# Patient Record
Sex: Female | Born: 1975 | Race: White | Hispanic: No | Marital: Single | Smoking: Never smoker
Health system: Southern US, Community
[De-identification: ages and names within clinical notes are randomized; demographics above are authoritative.]

## PROBLEM LIST (undated history)

## (undated) DIAGNOSIS — G932 Benign intracranial hypertension: Secondary | ICD-10-CM

## (undated) DIAGNOSIS — G35D Multiple sclerosis, unspecified: Secondary | ICD-10-CM

## (undated) DIAGNOSIS — R519 Headache, unspecified: Secondary | ICD-10-CM

## (undated) DIAGNOSIS — R51 Headache: Secondary | ICD-10-CM

## (undated) DIAGNOSIS — Q059 Spina bifida, unspecified: Secondary | ICD-10-CM

## (undated) DIAGNOSIS — G35 Multiple sclerosis: Secondary | ICD-10-CM

## (undated) HISTORY — DX: Headache, unspecified: R51.9

## (undated) HISTORY — DX: Headache: R51

---

## 1997-09-22 ENCOUNTER — Ambulatory Visit (HOSPITAL_COMMUNITY): Admission: EM | Admit: 1997-09-22 | Discharge: 1997-09-23 | Payer: Self-pay | Admitting: Pediatrics

## 1998-05-06 ENCOUNTER — Encounter: Admission: RE | Admit: 1998-05-06 | Discharge: 1998-08-04 | Payer: Self-pay | Admitting: *Deleted

## 2002-09-19 ENCOUNTER — Emergency Department (HOSPITAL_COMMUNITY): Admission: EM | Admit: 2002-09-19 | Discharge: 2002-09-19 | Payer: Self-pay | Admitting: Emergency Medicine

## 2004-03-24 ENCOUNTER — Ambulatory Visit (HOSPITAL_COMMUNITY): Admission: RE | Admit: 2004-03-24 | Discharge: 2004-03-24 | Payer: Self-pay | Admitting: Neurology

## 2008-05-28 ENCOUNTER — Emergency Department (HOSPITAL_BASED_OUTPATIENT_CLINIC_OR_DEPARTMENT_OTHER): Admission: EM | Admit: 2008-05-28 | Discharge: 2008-05-28 | Payer: Self-pay | Admitting: Emergency Medicine

## 2010-07-11 ENCOUNTER — Encounter: Payer: Self-pay | Admitting: Neurology

## 2011-09-12 ENCOUNTER — Encounter (HOSPITAL_BASED_OUTPATIENT_CLINIC_OR_DEPARTMENT_OTHER): Payer: Self-pay | Admitting: *Deleted

## 2011-09-12 ENCOUNTER — Emergency Department (HOSPITAL_BASED_OUTPATIENT_CLINIC_OR_DEPARTMENT_OTHER)
Admission: EM | Admit: 2011-09-12 | Discharge: 2011-09-13 | Disposition: A | Discharge status: Home / Self Care | Payer: PRIVATE HEALTH INSURANCE | Attending: Emergency Medicine | Admitting: Emergency Medicine

## 2011-09-12 DIAGNOSIS — M62830 Muscle spasm of back: Secondary | ICD-10-CM

## 2011-09-12 DIAGNOSIS — M549 Dorsalgia, unspecified: Secondary | ICD-10-CM | POA: Insufficient documentation

## 2011-09-12 DIAGNOSIS — Z79899 Other long term (current) drug therapy: Secondary | ICD-10-CM | POA: Insufficient documentation

## 2011-09-12 DIAGNOSIS — R0602 Shortness of breath: Secondary | ICD-10-CM | POA: Insufficient documentation

## 2011-09-12 DIAGNOSIS — R42 Dizziness and giddiness: Secondary | ICD-10-CM | POA: Insufficient documentation

## 2011-09-12 DIAGNOSIS — G35 Multiple sclerosis: Secondary | ICD-10-CM | POA: Insufficient documentation

## 2011-09-12 DIAGNOSIS — M5380 Other specified dorsopathies, site unspecified: Secondary | ICD-10-CM | POA: Insufficient documentation

## 2011-09-12 HISTORY — DX: Multiple sclerosis, unspecified: G35.D

## 2011-09-12 HISTORY — DX: Multiple sclerosis: G35

## 2011-09-12 HISTORY — DX: Benign intracranial hypertension: G93.2

## 2011-09-12 NOTE — ED Notes (Signed)
C/O right mid/upper back pain for about 3 days, pain worse over the past 24 hours. Also reports pain is worse with taking a deep breath. Took a muscle relaxer about 2 hours ago

## 2011-09-13 ENCOUNTER — Emergency Department (INDEPENDENT_AMBULATORY_CARE_PROVIDER_SITE_OTHER): Payer: PRIVATE HEALTH INSURANCE

## 2011-09-13 DIAGNOSIS — R0602 Shortness of breath: Secondary | ICD-10-CM

## 2011-09-13 DIAGNOSIS — R42 Dizziness and giddiness: Secondary | ICD-10-CM

## 2011-09-13 DIAGNOSIS — M549 Dorsalgia, unspecified: Secondary | ICD-10-CM

## 2011-09-13 MED ORDER — DIAZEPAM 5 MG PO TABS
5.0000 mg | ORAL_TABLET | Freq: Once | ORAL | Status: AC
  Administered 2011-09-13: 5 mg via ORAL
  Filled 2011-09-13: qty 1

## 2011-09-13 MED ORDER — HYDROCODONE-ACETAMINOPHEN 5-325 MG PO TABS
1.0000 | ORAL_TABLET | Freq: Once | ORAL | Status: AC
  Administered 2011-09-13: 1 via ORAL
  Filled 2011-09-13: qty 1

## 2011-09-13 MED ORDER — HYDROCODONE-ACETAMINOPHEN 5-325 MG PO TABS: 1.0000 | ORAL_TABLET | Freq: Four times a day (QID) | ORAL | Status: AC | PRN

## 2011-09-13 MED ORDER — DIAZEPAM 5 MG PO TABS: 5.0000 mg | ORAL_TABLET | Freq: Two times a day (BID) | ORAL | Status: AC

## 2011-09-13 NOTE — ED Notes (Signed)
Ambulates without distress. NAD at discharge

## 2011-09-13 NOTE — ED Provider Notes (Signed)
History   This chart was scribed for Hanley Seamen, MD by Charolett Bumpers . The patient was seen in room MH08/MH08 and the patient's care was started at 12:20pm.   CSN: [id number]  Arrival date & time 09/12/11  2251   First MD Initiated Contact with Patient 09/13/11 0019      Chief Complaint  Patient presents with  . Back Pain    (Consider location/radiation/quality/duration/timing/severity/associated sxs/prior treatment) HPI Mary Beck is a 36 y.o. female who presents to the Emergency Department complaining of constant, moderate mid back pain right of spine for the past 3 days. Patient states that the back pain worsened yesterday. Patient states the back pain is aggravated with deep breaths and certain positions. Patient also reports associated SOB and light-headedness. Patient also reports left knee pain, but states that is a chronic condition. Patient denies any recent travel. Patient denies taking birth control. Patient denies smoking. Patient reports no h/o blood clots.    Past Medical History  Diagnosis Date  . Multiple sclerosis   . Pseudotumor cerebri     History reviewed. No pertinent past surgical history.  No family history on file.  History  Substance Use Topics  . Smoking status: Never Smoker   . Smokeless tobacco: Never Used  . Alcohol Use: No    OB History    Grav Para Term Preterm Abortions TAB SAB Ect Mult Living                  Review of Systems A complete 10 system review of systems was obtained and is otherwise negative except as noted in the HPI and PMH.   Allergies  Review of patient's allergies indicates no known allergies.  Home Medications   Current Outpatient Rx  Name Route Sig Dispense Refill  . AMPHETAMINE-DEXTROAMPHET ER 25 MG PO CP24 Oral Take 25 mg by mouth every morning.    Marland Kitchen BUPROPION HCL ER (XL) 150 MG PO TB24 Oral Take 150 mg by mouth daily.    Marland Kitchen FLEXERIL PO Oral Take 1 tablet by mouth 3 (three) times daily as  needed. For muscle spasms    . FINGOLIMOD HCL 0.5 MG PO CAPS Oral Take 1 capsule by mouth daily.    . IBUPROFEN 200 MG PO TABS Oral Take 600 mg by mouth every 6 (six) hours as needed. For pain      BP 131/77  Pulse 96  Temp(Src) 97.7 F (36.5 C) (Oral)  Resp 18  SpO2 100%  LMP 08/29/2011  Physical Exam  Nursing note and vitals reviewed. Constitutional: She is oriented to person, place, and time. She appears well-developed and well-nourished. No distress.       Well score is 0.   HENT:  Head: Normocephalic and atraumatic.  Right Ear: External ear normal.  Left Ear: External ear normal.  Nose: Nose normal.  Eyes: EOM are normal. Pupils are equal, round, and reactive to light.  Neck: Normal range of motion. Neck supple. No tracheal deviation present.  Cardiovascular: Normal rate, regular rhythm and normal heart sounds.  Exam reveals no gallop and no friction rub.   No murmur heard. Pulmonary/Chest: Effort normal and breath sounds normal. No respiratory distress. She has no wheezes. She has no rales.  Abdominal: Soft. Bowel sounds are normal. She exhibits no distension. There is no tenderness.  Musculoskeletal: Normal range of motion. She exhibits no edema.       Tenderness to the right of mid thoracic spine. Palpable muscle  spasms felt. Multiple tender spots right of spine with palpable spasms.   Neurological: She is alert and oriented to person, place, and time. No sensory deficit.  Skin: Skin is warm and dry.  Psychiatric: She has a normal mood and affect. Her behavior is normal.    ED Course  Procedures (including critical care time)  DIAGNOSTIC STUDIES: Oxygen Saturation is 100% on room air, normal by my interpretation.    COORDINATION OF CARE:  0025: Discussed planned course of treatment with the patient who is agreeable at this time.      MDM  Nursing notes and vitals signs, including pulse oximetry, reviewed.  Summary of this visit's results, reviewed by  myself:  Imaging Studies: Dg Ribs Unilateral W/chest Right  09/13/2011  *RADIOLOGY REPORT*  Clinical Data: Mid back pain, just to the right of the spine, for 3 days, worse with deep breathing and certain positions.  Shortness of breath and lightheadedness.  RIGHT RIBS AND CHEST - 3+ VIEW  Comparison: None.  Findings: No displaced rib fractures are seen.  The lungs are well-aerated and clear.  There is no evidence of focal opacification, pleural effusion or pneumothorax.  The cardiomediastinal silhouette is within normal limits.  No acute osseous abnormalities are seen.  IMPRESSION: No acute cardiopulmonary process identified; no displaced rib fractures seen.  Original Report Authenticated By: Tonia Ghent, M.D.       I personally performed the services described in this documentation, which was scribed in my presence.  The recorded information has been reviewed and considered.      Hanley Seamen, MD 09/13/11 [id number]

## 2011-09-17 ENCOUNTER — Ambulatory Visit (INDEPENDENT_AMBULATORY_CARE_PROVIDER_SITE_OTHER): Payer: PRIVATE HEALTH INSURANCE | Admitting: Internal Medicine

## 2011-09-17 ENCOUNTER — Ambulatory Visit: Payer: Self-pay

## 2011-09-17 VITALS — BP 113/76 | HR 84 | Temp 98.1°F | Resp 16 | Ht 62.25 in | Wt 173.6 lb

## 2011-09-17 DIAGNOSIS — M546 Pain in thoracic spine: Secondary | ICD-10-CM

## 2011-09-17 DIAGNOSIS — G35 Multiple sclerosis: Secondary | ICD-10-CM | POA: Insufficient documentation

## 2011-09-17 MED ORDER — CYCLOBENZAPRINE HCL 10 MG PO TABS: 10.0000 mg | ORAL_TABLET | Freq: Every evening | ORAL | Status: AC | PRN

## 2011-09-17 MED ORDER — HYDROCODONE-ACETAMINOPHEN 5-500 MG PO TABS: 1.0000 | ORAL_TABLET | Freq: Three times a day (TID) | ORAL | Status: AC | PRN

## 2011-09-17 MED ORDER — MELOXICAM 15 MG PO TABS: 15.0000 mg | ORAL_TABLET | Freq: Every day | ORAL | Status: AC

## 2011-09-18 ENCOUNTER — Encounter: Payer: Self-pay | Admitting: Internal Medicine

## 2011-09-18 NOTE — Progress Notes (Signed)
  Subjective:    Patient ID: Mary Beck, female    DOB: Nov 13, 1975, 36 y.o.   MRN: [id number]  HPIPresents with continued upper back spasms requiring Vicodin over the past week/ Initially treated in the ER/out this evaluation included chest x-ray and rib films which were normal. She was given Valium and hydrocodone. The Valium makes her too sleepy to use. She now describes persistent right thoracic pain posteriorly which is worse with activity. It is not currently preventing sleep but is present immediately upon wakening. She has no shortness of breath and no longer has pain with deep inspiration. There is no recent illness. No fever. No known neck or upper back injuries. This did start one day after vigorous exercise with pushups over a medicine ball.  Past history: she is on medication from Dr. 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   Review of SystemsNo gastrointestinal or genitourinary complaints No cardiovascular respiratory complaints     Objective:   Physical Exam Vital signs are stable HEENT is clear without thyromegaly or lymphadenopathy Lungs are clear to auscultation Heart is regular with a rate of 80 She is tender in a large area along the right scapular border The neck has a full range of motion without pain Full respiration is non-painful The scapular areas not swollen oriented There is no tenderness to palpation over the thoracic vertebra Both shoulders have a full range of motion without painful arc Sensory is intact in the upper extremities/motor also There is no skin rash in the thoracic area       Assessment & Plan:  Problem #1 thoracic pain secondary to muscle injury  Flexeril 10 mg at bedtime Mobic 15 mg daily Heat Stretch Massage Hydrocodone 5  500 4 times a day when necessary #28 one refill Followup in 2 weeks or sooner if worse for more detailed imaging studies

## 2012-01-03 ENCOUNTER — Encounter (HOSPITAL_BASED_OUTPATIENT_CLINIC_OR_DEPARTMENT_OTHER): Payer: Self-pay

## 2012-01-03 ENCOUNTER — Emergency Department (HOSPITAL_BASED_OUTPATIENT_CLINIC_OR_DEPARTMENT_OTHER)
Admission: EM | Admit: 2012-01-03 | Discharge: 2012-01-03 | Disposition: A | Discharge status: Home / Self Care | Payer: No Typology Code available for payment source | Attending: Emergency Medicine | Admitting: Emergency Medicine

## 2012-01-03 DIAGNOSIS — S39012A Strain of muscle, fascia and tendon of lower back, initial encounter: Secondary | ICD-10-CM

## 2012-01-03 DIAGNOSIS — Y92410 Unspecified street and highway as the place of occurrence of the external cause: Secondary | ICD-10-CM | POA: Insufficient documentation

## 2012-01-03 DIAGNOSIS — R51 Headache: Secondary | ICD-10-CM | POA: Insufficient documentation

## 2012-01-03 DIAGNOSIS — S161XXA Strain of muscle, fascia and tendon at neck level, initial encounter: Secondary | ICD-10-CM

## 2012-01-03 DIAGNOSIS — G35 Multiple sclerosis: Secondary | ICD-10-CM | POA: Insufficient documentation

## 2012-01-03 DIAGNOSIS — S139XXA Sprain of joints and ligaments of unspecified parts of neck, initial encounter: Secondary | ICD-10-CM | POA: Insufficient documentation

## 2012-01-03 DIAGNOSIS — S335XXA Sprain of ligaments of lumbar spine, initial encounter: Secondary | ICD-10-CM | POA: Insufficient documentation

## 2012-01-03 DIAGNOSIS — G932 Benign intracranial hypertension: Secondary | ICD-10-CM | POA: Insufficient documentation

## 2012-01-03 MED ORDER — HYDROCODONE-ACETAMINOPHEN 5-325 MG PO TABS: ORAL_TABLET | ORAL | Status: AC

## 2012-01-03 MED ORDER — IBUPROFEN 400 MG PO TABS
600.0000 mg | ORAL_TABLET | Freq: Once | ORAL | Status: AC
  Administered 2012-01-03: 600 mg via ORAL
  Filled 2012-01-03: qty 1

## 2012-01-03 MED ORDER — DIAZEPAM 5 MG PO TABS: 5.0000 mg | ORAL_TABLET | Freq: Three times a day (TID) | ORAL | Status: AC | PRN
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
## 2012-01-03 NOTE — Discharge Instructions (Signed)
 Back Pain, Adult Low back pain is very common. About 1 in 5 people have back pain.The cause of low back pain is rarely dangerous. The pain often gets better over time.About half of people with a sudden onset of back pain feel better in just 2 weeks. About 8 in 10 people feel better by 6 weeks.  CAUSES Some common causes of back pain include:  Strain of the muscles or ligaments supporting the spine.   Wear and tear (degeneration) of the spinal discs.   Arthritis.   Direct injury to the back.  DIAGNOSIS Most of the time, the direct cause of low back pain is not known.However, back pain can be treated effectively even when the exact cause of the pain is unknown.Answering your caregiver's questions about your overall health and symptoms is one of the most accurate ways to make sure the cause of your pain is not dangerous. If your caregiver needs more information, he or she may order lab work or imaging tests (X-rays or MRIs).However, even if imaging tests show changes in your back, this usually does not require surgery. HOME CARE INSTRUCTIONS For many people, back pain returns.Since low back pain is rarely dangerous, it is often a condition that people can learn to [facility] their own.   Remain active. It is stressful on the back to sit or stand in one place. Do not sit, drive, or stand in one place for more than 30 minutes at a time. Take short walks on level surfaces as soon as pain allows.Try to increase the length of time you walk each day.   Do not stay in bed.Resting more than 1 or 2 days can delay your recovery.   Do not avoid exercise or work.Your body is made to move.It is not dangerous to be active, even though your back may hurt.Your back will likely heal faster if you return to being active before your pain is gone.   Pay attention to your body when you bend and lift. Many people have less discomfortwhen lifting if they bend their knees, keep the load close to their  bodies,and avoid twisting. Often, the most comfortable positions are those that put less stress on your recovering back.   Find a comfortable position to sleep. Use a firm mattress and lie on your side with your knees slightly bent. If you lie on your back, put a pillow under your knees.   Only take over-the-counter or prescription medicines as directed by your caregiver. Over-the-counter medicines to reduce pain and inflammation are often the most helpful.Your caregiver may prescribe muscle relaxant drugs.These medicines help dull your pain so you can more quickly return to your normal activities and healthy exercise.   Put ice on the injured area.   Put ice in a plastic bag.   Place a towel between your skin and the bag.   Leave the ice on for 15 to 20 minutes, 3 to 4 times a day for the first 2 to 3 days. After that, ice and heat may be alternated to reduce pain and spasms.   Ask your caregiver about trying back exercises and gentle massage. This may be of some benefit.   Avoid feeling anxious or stressed.Stress increases muscle tension and can worsen back pain.It is important to recognize when you are anxious or stressed and learn ways to manage it.Exercise is a great option.  SEEK MEDICAL CARE IF:  You have pain that is not relieved with rest or medicine.   You have  pain that does not improve in 1 week.   You have new symptoms.   You are generally not feeling well.  SEEK IMMEDIATE MEDICAL CARE IF:   You have pain that radiates from your back into your legs.   You develop new bowel or bladder control problems.   You have unusual weakness or numbness in your arms or legs.   You develop nausea or vomiting.   You develop abdominal pain.   You feel faint.  Document Released: 06/07/2005 Document Revised: 05/27/2011 Document Reviewed: 10/26/2010 [facility] Patient Information 2012 [place], [place].    Cervical Strain Care After A cervical strain is when the muscles and  ligaments in your neck have been stretched. The bones are not broken. If you had any problems moving your arms or legs immediately after the injury, even if the problem has gone away, make sure to tell this to your caregiver.  HOME CARE INSTRUCTIONS   While awake, apply ice packs to the neck or areas of pain about every 1 to 2 hours, for 15 to 20 minutes at a time. Do this for 2 days. If you were given a cervical collar for support, ask your caregiver if you may remove it for bathing or applying ice.   If given a cervical collar, wear as instructed. Do not remove any collar unless instructed by a caregiver.   Only take over-the-counter or prescription medicines for pain, discomfort, or fever as directed by your caregiver.  Recheck with the hospital or clinic after a radiologist has read your X-rays. Recheck with the hospital or clinic to make sure the initial readings are correct. Do this also to determine if you need further studies. It is your responsibility to find out your X-ray results. X-rays are sometimes repeated in one week to ten days. These are often repeated to make sure that a hairline fracture was not overlooked. Ask your caregiver how you are to find out about your radiology (X-ray) results. SEEK IMMEDIATE MEDICAL CARE IF:   You have increasing pain in your neck.   You develop difficulties swallowing or breathing.   You have numbness, weakness, or movement problems in the arms or legs.   You have difficulty walking.   You develop bowel or bladder retention or incontinence.   You have problems with walking.  MAKE SURE YOU:   Understand these instructions.   Will watch your condition.   Will get help right away if you are not doing well or get worse.  Document Released: 06/07/2005 Document Revised: 02/17/2011 Document Reviewed: 01/19/2008 [facility] Patient Information 2012 [place], [place].     Narcotic and benzodiazepine use may cause drowsiness, slowed breathing or  dependence.  Please use with caution and do not drive, operate machinery or watch young children alone while taking them.  Taking combinations of these medications or drinking alcohol will potentiate these effects.
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
## 2014-08-07 ENCOUNTER — Encounter: Payer: Self-pay | Admitting: Neurology

## 2014-08-07 ENCOUNTER — Ambulatory Visit (INDEPENDENT_AMBULATORY_CARE_PROVIDER_SITE_OTHER): Payer: BLUE CROSS/BLUE SHIELD | Admitting: Neurology

## 2014-08-07 VITALS — BP 118/86 | HR 78 | Resp 16 | Ht 63.0 in | Wt 173.0 lb

## 2014-08-07 DIAGNOSIS — F32A Depression, unspecified: Secondary | ICD-10-CM | POA: Insufficient documentation

## 2014-08-07 DIAGNOSIS — R5383 Other fatigue: Secondary | ICD-10-CM

## 2014-08-07 DIAGNOSIS — R350 Frequency of micturition: Secondary | ICD-10-CM | POA: Insufficient documentation

## 2014-08-07 DIAGNOSIS — G35 Multiple sclerosis: Secondary | ICD-10-CM

## 2014-08-07 DIAGNOSIS — F329 Major depressive disorder, single episode, unspecified: Secondary | ICD-10-CM | POA: Insufficient documentation

## 2014-08-07 MED ORDER — BUPROPION HCL ER (XL) 300 MG PO TB24: 300.0000 mg | ORAL_TABLET | Freq: Every day | ORAL | Status: DC

## 2014-08-07 MED ORDER — ZOLPIDEM TARTRATE 5 MG PO TABS: 5.0000 mg | ORAL_TABLET | Freq: Every evening | ORAL | Status: DC | PRN

## 2014-08-07 MED ORDER — OXYBUTYNIN CHLORIDE 5 MG PO TABS: 5.0000 mg | ORAL_TABLET | Freq: Three times a day (TID) | ORAL | Status: DC

## 2014-08-07 MED ORDER — AMPHETAMINE-DEXTROAMPHET ER 25 MG PO CP24: ORAL_CAPSULE | ORAL | Status: DC

## 2014-08-07 NOTE — Progress Notes (Signed)
[facility]  PATIENT: Mary Beck DOB: 1976-02-07  REFERRING DOCTOR OR PCP:   None  SOURCE: Patient  _________________________________   HISTORICAL  CHIEF COMPLAINT:  Chief Complaint  Patient presents with  . Multiple Sclerosis    Sts. she tolerates Gilenya well.  Sts. has had more frequent h/a's, some bilat eye pain, but thinks this may be r/t sinus problems/fim    HISTORY OF PRESENT ILLNESS:  Mary Beck is a 39 year old woman who was diagnosed with MS about 6 years ago after presenting with numbness in the feet up to her knees. She had an MRI of the brain that was abnormal. This was followed by a lumbar puncture in the CSF was also abnormal. She was diagnosed with MS and was placed on Copaxone. She had an exacerbation and was switched to Betaseron. Due to needle fatigue she was switched to Gilenya about 2 years ago. Since being on Gilenya her MS has been stable. She denies any exacerbations. She tolerates Gilenya well. She had an MRI of the brain early 2015 that was unchanged compared to her previous MRI.  She has several symptoms related to the MS.   Her right hand numbness never fully recovered from her major exacerbation. The numbness is in the hand and the wrist.  Her hand feels asleep but there is no significant pins and needles sensation. She denies any difficulty with her gait. She notes no numbness or weakness in her legs.  She denies any current visual symptoms. However, she did have pseudotumor cerebri about 15 or 20 years ago and had papilledema. She had a lot of headaches at that time. She had serial lumbar punctures performed and was placed on Diamox and her symptoms resolved. Later on, she lost 70 pounds and her symptoms got better.  She does note bladder urgency. This is helped by oxybutynin 10 mg daily however, when she lays down to go to bed but before she falls asleep she will often need to go to the bathroom several times. Once she falls  asleep, she does better.  She reports fatigue daily that does better later in the day. It is a combination of physical and cognitive fatigue.  TE is worse when she is hot.  She has had insomnia with more difficulty falling asleep. Once asleep she usually does well. In the past, Ambien have helped her sleep onset insomnia. She tolerated it well and had no hangover effect.   Adderall 25 mg daily has helped her.  She has had some mood disturbances with depression. She denies anxiety. She is currently on Wellbutrin with mild benefit.   She tolerates Wellbutrin well.   She was once on a combination of Wellbutrin with an SSRI but felt better on just the one medicine. She has noted mild cognitive issues. Specifically she finds her processing speed is reduced. She has noted some verbal fluency with difficulty getting the right word out. She has not noted much issues with executive function. Short-term memory is mildly reduced.   REVIEW OF SYSTEMS: Constitutional: No fevers, chills, sweats, or change in appetite.   She reports fatigue Eyes: No visual changes, double vision, .  However, mild eye pain Ear, nose and throat: No hearing loss, ear pain, nasal congestion, sore throat Cardiovascular: No chest pain, palpitations Respiratory: No shortness of breath at rest or with exertion.   No wheezes GastrointestinaI: No nausea, vomiting, diarrhea, abdominal pain, fecal incontinence Genitourinary: No dysuria or urinary retention.  She notes frequency and  nocturia. Musculoskeletal: No neck pain, back pain Integumentary: No rash, pruritus, skin lesions Neurological: as above.  Also, occasional Headache Psychiatric: Reports depression but little anxiety Endocrine: No palpitations, diaphoresis, change in appetite, change in weigh or increased thirst Hematologic/Lymphatic: No anemia, purpura, petechiae. Allergic/Immunologic: No itchy/runny eyes, nasal congestion, recent allergic reactions,  rashes  ALLERGIES: No Known Allergies  HOME MEDICATIONS:  Current outpatient prescriptions:  .  amphetamine-dextroamphetamine (ADDERALL XR) 25 MG 24 hr capsule, Take 25 mg by mouth every morning., Disp: , Rfl:  .  buPROPion (WELLBUTRIN XL) 150 MG 24 hr tablet, Take 150 mg by mouth daily., Disp: , Rfl:  .  cholecalciferol (VITAMIN D) 1000 UNITS tablet, Take 1,000 Units by mouth daily. Take 5 capsules daily, Disp: , Rfl:  .  Fingolimod HCl (GILENYA) 0.5 MG CAPS, Take 1 capsule by mouth daily., Disp: , Rfl:  .  ibuprofen (ADVIL,MOTRIN) 200 MG tablet, Take 600 mg by mouth every 6 (six) hours as needed. Patient used this medication for pain., Disp: , Rfl:  .  loratadine (CLARITIN) 10 MG tablet, Take 10 mg by mouth daily., Disp: , Rfl:  .  oxybutynin (DITROPAN-XL) 10 MG 24 hr tablet, Take 10 mg by mouth at bedtime., Disp: , Rfl:   PAST MEDICAL HISTORY: Past Medical History  Diagnosis Date  . Multiple sclerosis   . Pseudotumor cerebri   . Headache     PAST SURGICAL HISTORY: History reviewed. No pertinent past surgical history.  FAMILY HISTORY: Family History  Problem Relation Age of Onset  . Multiple sclerosis Neg Hx     SOCIAL HISTORY:  History   Social History  . Marital Status: Single    Spouse Name: N/A  . Number of Children: N/A  . Years of Education: N/A   Occupational History  . Not on file.   Social History Main Topics  . Smoking status: Never Smoker   . Smokeless tobacco: Never Used  . Alcohol Use: No  . Drug Use: No  . Sexual Activity: Yes    Birth Control/ Protection: None   Other Topics Concern  . Not on file   Social History Narrative     PHYSICAL EXAM  Filed Vitals:   08/07/14 1418  BP: 118/86  Pulse: 78  Resp: 16  Height:  (1.6 m)  Weight: 173 lb (78.472 kg)    Body mass index is 30.65 kg/(m^2).   General: The patient is well-developed and well-nourished and in no acute distress  Eyes:  Funduscopic exam shows normal optic discs  and retinal vessels.  Neck: The neck is supple, no carotid bruits are noted.  The neck is nontender.  Cardiovascular: The heart has a regular rate and rhythm with a normal S1 and S2. There were no murmurs, gallops or rubs. Lungs are clear to auscultation.  Skin: Extremities are without significant edema.  Musculoskeletal:  Back is nontender  Neurologic Exam  Mental status: The patient is alert and oriented x 3 at the time of the examination. The patient has apparent normal recent and remote memory, with an apparently normal attention span and concentration ability.   Speech is normal.  Cranial nerves: Extraocular movements are full. Pupils are equal, round, and reactive to light and accomodation.  Symmetric color visioan.   Visual fields are full.  Facial symmetry is present. There is good facial sensation to soft touch bilaterally.Facial strength is normal.  Trapezius and sternocleidomastoid strength is normal. No dysarthria is noted.  The tongue is midline, and the  patient has symmetric elevation of the soft palate. No obvious hearing deficits are noted.  Motor:  Muscle bulk is normal.   Tone is normal. Strength is  5 / 5 in all 4 extremities.   Sensory: Sensory testing showed mild reduced touch and vibration sensation in the right hand and forearm compared to the left. Legs were normal. Temperature sensation was symmetric.Marland Kitchen  Coordination: Cerebellar testing reveals good finger-nose-finger and heel-to-shin bilaterally.  Gait and station: Station is normal.   Gait is normal. Tandem gait is mildly wide. Romberg is negative.   Reflexes: Deep tendon reflexes are normal and symmetric in the arms. Reflexes were increased in the knees, a little more on the left with there was spread..   Plantar responses are flexor.    DIAGNOSTIC DATA (LABS, IMAGING, TESTING) - I reviewed patient records, labs, notes, testing and imaging myself where available.     ASSESSMENT AND PLAN  MS (multiple  sclerosis) - Plan: CBC with Differential, Hepatic Function Panel  Urinary frequency  Other fatigue - Plan: CBC with Differential, Hepatic Function Panel  Depression   In summary, Mary Beck is a 39 year old woman with a six-year history of multiple sclerosis. Her main symptoms are fatigue, depression and urinary frequency. She appears to be stable on Gilenya we will check some blood work to assess for the degree of lymphopenia and to determine if there is any hepatotoxicity. We'll change her oxybutynin to 5 mg by mouth 3 times a day to allow for more dosing at night. For her insomnia I have prescribed zolpidem and we will renew her Wellbutrin at a higher dose as she has had issues with worsening depression.  She will return to see [place] in 6 months or sooner if she has new or worsening neurologic symptoms.  45 minute face-to-face interaction with greater than 50% of the time counseling and coordinating care about her multiple sclerosis and worsening mood issues.  wellb renew  zolpi   labs  Ender Rorke A. Epimenio Foot, MD, PhD 08/07/2014, 2:19 PM Certified in Neurology, Clinical Neurophysiology, Sleep Medicine, Pain Medicine and Neuroimaging  [facility] [address] [place], [place] [address] [phone]

## 2014-08-08 LAB — CBC WITH DIFFERENTIAL/PLATELET
Basophils Absolute: 0 10*3/uL (ref 0.0–0.2)
Basos: 0 %
Eos: 1 %
Eosinophils Absolute: 0 10*3/uL (ref 0.0–0.4)
HCT: 39.8 % (ref 34.0–46.6)
Hemoglobin: 13.2 g/dL (ref 11.1–15.9)
Immature Grans (Abs): 0 10*3/uL (ref 0.0–0.1)
Immature Granulocytes: 0 %
Lymphocytes Absolute: 0.4 10*3/uL — ABNORMAL LOW (ref 0.7–3.1)
Lymphs: 12 %
MCH: 30.4 pg (ref 26.6–33.0)
MCHC: 33.2 g/dL (ref 31.5–35.7)
MCV: 92 fL (ref 79–97)
Monocytes Absolute: 0.4 10*3/uL (ref 0.1–0.9)
Monocytes: 10 %
Neutrophils Absolute: 2.9 10*3/uL (ref 1.4–7.0)
Neutrophils Relative %: 77 %
Platelets: 290 10*3/uL (ref 150–379)
RBC: 4.34 x10E6/uL (ref 3.77–5.28)
RDW: 14.2 % (ref 12.3–15.4)
WBC: 3.8 10*3/uL (ref 3.4–10.8)

## 2014-08-08 LAB — HEPATIC FUNCTION PANEL
ALT: 12 IU/L (ref 0–32)
AST: 14 IU/L (ref 0–40)
Albumin: 4.1 g/dL (ref 3.5–5.5)
Alkaline Phosphatase: 44 IU/L (ref 39–117)
Bilirubin Total: 0.2 mg/dL (ref 0.0–1.2)
Bilirubin, Direct: 0.09 mg/dL (ref 0.00–0.40)
Total Protein: 6.1 g/dL (ref 6.0–8.5)

## 2014-08-09 ENCOUNTER — Telehealth: Payer: Self-pay | Admitting: *Deleted

## 2014-08-09 NOTE — Telephone Encounter (Signed)
LMOM identified vm that labwork looks good, that per [facility], lymphocytes are a little low, but this is to be expected with Gilenya, and they are not low enough to cause concern at this time.  She should continue Gilenya as rx'd, and call with any questions or concerns/fim

## 2014-08-12 ENCOUNTER — Telehealth: Payer: Self-pay | Admitting: Neurology

## 2014-08-12 NOTE — Telephone Encounter (Signed)
Patient stated prior authorization was needed for Rx amphetamine-dextroamphetamine (ADDERALL XR) 25 MG 24 hr capsule per [facility] on [address]  Please call and advise.

## 2014-08-12 NOTE — Telephone Encounter (Signed)
All info has been sent to ins.  Request is currently under review.  I called the patient back. Got no answer.

## 2014-08-14 ENCOUNTER — Telehealth: Payer: Self-pay

## 2014-08-14 NOTE — Telephone Encounter (Signed)
[employer] [place] has approved the request for coverage on Adderall XR effective until 08/11/2015 Ref # [id number] I called the patient to advise.  Got no answer.  Left message.

## 2014-09-19 ENCOUNTER — Encounter: Payer: Self-pay | Admitting: Family Medicine

## 2014-09-19 ENCOUNTER — Ambulatory Visit (INDEPENDENT_AMBULATORY_CARE_PROVIDER_SITE_OTHER): Payer: BLUE CROSS/BLUE SHIELD | Admitting: Family Medicine

## 2014-09-19 VITALS — BP 132/91 | HR 104 | Temp 97.3°F | Resp 20

## 2014-09-19 DIAGNOSIS — Z23 Encounter for immunization: Secondary | ICD-10-CM | POA: Diagnosis not present

## 2014-09-19 DIAGNOSIS — M79602 Pain in left arm: Secondary | ICD-10-CM | POA: Diagnosis not present

## 2014-09-19 DIAGNOSIS — W540XXA Bitten by dog, initial encounter: Secondary | ICD-10-CM

## 2014-09-19 DIAGNOSIS — M79601 Pain in right arm: Secondary | ICD-10-CM

## 2014-09-19 DIAGNOSIS — M79643 Pain in unspecified hand: Secondary | ICD-10-CM | POA: Diagnosis not present

## 2014-09-19 MED ORDER — AMOXICILLIN-POT CLAVULANATE 875-125 MG PO TABS: 1.0000 | ORAL_TABLET | Freq: Two times a day (BID) | ORAL | Status: DC

## 2014-09-19 MED ORDER — ACETAMINOPHEN-CODEINE 120-12 MG/5ML PO SOLN
10.0000 mL | Freq: Once | ORAL | Status: AC
  Administered 2014-09-19: 10 mL via ORAL

## 2014-09-19 MED ORDER — KETOROLAC TROMETHAMINE 60 MG/2ML IM SOLN
60.0000 mg | Freq: Once | INTRAMUSCULAR | Status: AC
  Administered 2014-09-19: 60 mg via INTRAMUSCULAR

## 2014-09-19 MED ORDER — HYDROCODONE-ACETAMINOPHEN 5-325 MG PO TABS: 1.0000 | ORAL_TABLET | Freq: Four times a day (QID) | ORAL | Status: DC | PRN

## 2014-09-19 MED ORDER — CEFTRIAXONE SODIUM 1 G IJ SOLR
1.0000 g | Freq: Once | INTRAMUSCULAR | Status: AC
  Administered 2014-09-19: 1 g via INTRAMUSCULAR

## 2014-09-19 NOTE — Progress Notes (Signed)
PROCEDURE NOTE: Verbal consent obtained from patient.  Local anesthesia with 5cc (total) Lidocaine 2% with epinephrine.  Wounds explored for tendon, ligament damage.  Wound scrubbed with soap and water and rinsed.  Wound closed with 5-0 Prolene simple interrupted sutures, loosely approximated to allow for drainage. See figure.  Wound cleansed and dressed.  Physical Exam  Musculoskeletal:       Arms:      Hands:  Mary Bamberg, PA-C [facility] [phone] 09/19/2014  8:18 PM

## 2014-09-19 NOTE — Progress Notes (Signed)
Subjective: 39 year old lady who was house sitting some dogs. This was the first day she has done this for the stalks of believe. The 2 dogs managed to get together and don't get along. She was unable to separate them, and they hit her multiple times on her hands and arm. She said this happened at about 4:30 and she came over here. She had to use a hose to split the dogs apart. She is wet from this. She doesn't know when her last tetanus shot was. She does not have one in the system.  Objective: Mary Beck is cold. She has blood all over her hands. She has multiple bites on her hands, cannot distinguish with the dry blood initially. We will contact cleaned off. She has a larger cyst wound at the DIP area of the left thumb. Her right upper arm there is a 1.5 cm laceration, widely gapping open. Her clothing is wet and making her children.  After things were cleaned up it became pretty apparent that she had mini little puncture wounds, and also several places that would need some suture repair. The wounds that need suture repair with visible gaping open to wide such that there was subcutaneous fatty tissue pushing it way out of the wound. The PA will document these with his note. Photos will be taken.  Assessment: Multiple dog bites on hands and upper extremities  Plan: Get her cleaned up and assess further Toradol 60 mg IM TDAP Ceftriaxone 1 g IM Augmentin The left thumb and the right upper arm probably each need a loose suture to approximate them, and is made one in place on the right wrist. Will photograph the wounds to document

## 2014-09-19 NOTE — Patient Instructions (Addendum)
Take Augmentin twice daily for infection  Take the pain pills, hydrocodone, every 4 hours if needed for pain  Can take ibuprofen or Aleve in addition to the hydrocodone if needed  Return any time if abruptly worse. Otherwise please return on Saturday for a recheck. Ask for Dr. Alwyn Ren.  Very important to find out immunization status about the dog's.

## 2014-09-21 ENCOUNTER — Ambulatory Visit (INDEPENDENT_AMBULATORY_CARE_PROVIDER_SITE_OTHER): Payer: BLUE CROSS/BLUE SHIELD | Admitting: Family Medicine

## 2014-09-21 VITALS — BP 136/100 | HR 90 | Temp 98.3°F | Resp 16 | Ht 63.0 in | Wt 178.1 lb

## 2014-09-21 DIAGNOSIS — S61452D Open bite of left hand, subsequent encounter: Secondary | ICD-10-CM

## 2014-09-21 DIAGNOSIS — S41152D Open bite of left upper arm, subsequent encounter: Secondary | ICD-10-CM

## 2014-09-21 DIAGNOSIS — S41151D Open bite of right upper arm, subsequent encounter: Secondary | ICD-10-CM

## 2014-09-21 DIAGNOSIS — W540XXD Bitten by dog, subsequent encounter: Secondary | ICD-10-CM

## 2014-09-21 DIAGNOSIS — S61451D Open bite of right hand, subsequent encounter: Secondary | ICD-10-CM

## 2014-09-21 DIAGNOSIS — L089 Local infection of the skin and subcutaneous tissue, unspecified: Secondary | ICD-10-CM

## 2014-09-21 NOTE — Patient Instructions (Addendum)
Return as needed  Return in one week for sutures to be removed  Work excuse

## 2014-09-21 NOTE — Progress Notes (Signed)
Subjective: Patient was seen 2 days ago when she was chewed up by some dogs when she was breaking up a dog fight. She had multiple bites her hands and arms. She had 5 places that needed suturing. She is here for recheck. She's been taking her Augmentin. She is doing better. It turns out the dogs were both immunized.  Objective: Right hand is fairly puffy but not red or inflamed except right around the areas of puncture wounds. All the wounds were examined and appeared to be doing very satisfactorily.  Assessment: Multiple dog bite puncture wounds and lacerations, improving  Plan: Return in about one week for sutures to be removed. Continue the Augmentin. Return at any time if other concerns. She and her friend both understand these instructions.

## 2014-09-28 ENCOUNTER — Ambulatory Visit (INDEPENDENT_AMBULATORY_CARE_PROVIDER_SITE_OTHER): Payer: BLUE CROSS/BLUE SHIELD | Admitting: Physician Assistant

## 2014-09-28 VITALS — BP 110/78 | HR 87 | Temp 97.5°F | Resp 18 | Ht 63.0 in

## 2014-09-28 DIAGNOSIS — L089 Local infection of the skin and subcutaneous tissue, unspecified: Secondary | ICD-10-CM

## 2014-09-28 DIAGNOSIS — S61451D Open bite of right hand, subsequent encounter: Secondary | ICD-10-CM

## 2014-09-28 DIAGNOSIS — S41152D Open bite of left upper arm, subsequent encounter: Secondary | ICD-10-CM

## 2014-09-28 DIAGNOSIS — W540XXD Bitten by dog, subsequent encounter: Secondary | ICD-10-CM

## 2014-09-28 DIAGNOSIS — S61452D Open bite of left hand, subsequent encounter: Secondary | ICD-10-CM

## 2014-09-28 DIAGNOSIS — S41151D Open bite of right upper arm, subsequent encounter: Secondary | ICD-10-CM

## 2014-09-28 NOTE — Patient Instructions (Signed)
Apply warm compresses a few times a day. Continue applying neosporin. Finish antibiotics Come back in 3 days for wound check or sooner if symptoms worsen.

## 2014-09-28 NOTE — Progress Notes (Signed)
Subjective:    Patient ID: Mary Beck, female    DOB: [birth year], 39 y.o.   MRN: [id number]  HPI  This is a 39 year old female who is presenting for suture removal. 10 days ago she sustained several dog bit puncture wounds to bilateral hands and arms. Sutures placed in some wounds. She received rocephin in office and augmentin to take at home. She has 1-2 days lefts of augmentin. Pt is noting some pain in a wound on the right thumb and ring finger. She denies fever or chills.  Review of Systems  Constitutional: Negative for fever and chills.  Gastrointestinal: Negative for nausea and vomiting.  Skin: Positive for color change and wound.  Neurological: Negative for numbness.   Patient Active Problem List   Diagnosis Date Noted  . Urinary frequency 08/07/2014  . Other fatigue 08/07/2014  . Depression 08/07/2014  . MS (multiple sclerosis) 09/17/2011   Prior to Admission medications   Medication Sig Start Date End Date Taking? Authorizing Provider  amoxicillin-clavulanate (AUGMENTIN) 875-125 MG per tablet Take 1 tablet by mouth 2 (two) times daily. 09/19/14  Yes Peyton Najjar, MD  amphetamine-dextroamphetamine (ADDERALL XR) 25 MG 24 hr capsule Take 2 tablets daily 08/07/14  Yes Asa Lente, MD  buPROPion (WELLBUTRIN XL) 300 MG 24 hr tablet Take 1 tablet (300 mg total) by mouth daily. 08/07/14  Yes Asa Lente, MD  cholecalciferol (VITAMIN D) 1000 UNITS tablet Take 1,000 Units by mouth daily. Take 5 capsules daily   Yes Historical Provider, MD  Fingolimod HCl (GILENYA) 0.5 MG CAPS Take 1 capsule by mouth daily.   Yes Historical Provider, MD  ibuprofen (ADVIL,MOTRIN) 200 MG tablet Take 600 mg by mouth every 6 (six) hours as needed. Patient used this medication for pain.   Yes Historical Provider, MD  loratadine (CLARITIN) 10 MG tablet Take 10 mg by mouth daily.   Yes Historical Provider, MD  oxybutynin (DITROPAN) 5 MG tablet Take 1 tablet (5 mg total) by mouth 3 (three) times  daily. 08/07/14  Yes Asa Lente, MD  zolpidem (AMBIEN) 5 MG tablet Take 1 tablet (5 mg total) by mouth at bedtime as needed for sleep. 08/07/14  Yes Asa Lente, MD          No Known Allergies  Patient's social and family history were reviewed.     Objective:   Physical Exam  Constitutional: She is oriented to person, place, and time. She appears well-developed and well-nourished. No distress.  HENT:  Head: Normocephalic and atraumatic.  Right Ear: Hearing normal.  Left Ear: Hearing normal.  Nose: Nose normal.  Eyes: Conjunctivae and lids are normal. Right eye exhibits no discharge. Left eye exhibits no discharge. No scleral icterus.  Pulmonary/Chest: Effort normal. No respiratory distress.  Musculoskeletal: Normal range of motion.  Neurological: She is alert and oriented to person, place, and time.  Skin: Skin is warm and dry.  #7 sutures removed from 4 separate lacerations. Wound of right ring finger with pain and erythema, possible paronychia developing. No fluctuance noted. Wound of right thumb with mild tenderness and erythema. No fluctuance or drainage.  Psychiatric: She has a normal mood and affect. Her speech is normal and behavior is normal. Thought content normal.   BP 110/78 mmHg  Pulse 87  Temp(Src) 97.5 F (36.4 C) (Oral)  Resp 18  Ht  (1.6 m)  SpO2 94%  LMP 09/12/2014     Assessment & Plan:  1. Dog  bite of arm, left, subsequent encounter 2. Dog bite of right arm, subsequent encounter 3. Dog bite of right hand with infection, subsequent encounter 4. Dog bite of left hand with infection, subsequent encounter Wounds look good except for some mild erythema around some of the puncture wounds. Sutures removed. She has 1-2 days of augmentin left. She will finish abx and return in 3 days for recheck or sooner if pain/erythema worsens. Possible paronychia developing in right ring finger. I think warm compresses will help.   [place] Brackett,  M[facility]  09/28/2014

## 2014-10-02 ENCOUNTER — Ambulatory Visit (INDEPENDENT_AMBULATORY_CARE_PROVIDER_SITE_OTHER): Payer: BLUE CROSS/BLUE SHIELD | Admitting: Family Medicine

## 2014-10-02 VITALS — BP 106/82 | HR 86 | Temp 98.2°F | Resp 16 | Ht 63.0 in | Wt 174.0 lb

## 2014-10-02 DIAGNOSIS — S41152D Open bite of left upper arm, subsequent encounter: Secondary | ICD-10-CM

## 2014-10-02 DIAGNOSIS — S61452D Open bite of left hand, subsequent encounter: Secondary | ICD-10-CM | POA: Diagnosis not present

## 2014-10-02 DIAGNOSIS — S61259D Open bite of unspecified finger without damage to nail, subsequent encounter: Secondary | ICD-10-CM | POA: Diagnosis not present

## 2014-10-02 DIAGNOSIS — S41151D Open bite of right upper arm, subsequent encounter: Secondary | ICD-10-CM

## 2014-10-02 DIAGNOSIS — W540XXD Bitten by dog, subsequent encounter: Secondary | ICD-10-CM | POA: Diagnosis not present

## 2014-10-02 DIAGNOSIS — S61451D Open bite of right hand, subsequent encounter: Secondary | ICD-10-CM

## 2014-10-02 NOTE — Progress Notes (Signed)
Subjective: Patient is here for a follow-up with regard to her multiple dog bites. She feels like she is doing adequately from the emotional standpoint. No nightmares. She says she won't take care of strange dogs again.  She feels numbness and decreased motion in the left thumb which had a couple of bite wounds. The right fourth finger still has some decreased motion also as does the right pinky.  Objective: She has 7 flexion and extension of each of the digits. There is still some swelling especially of the left thumb and right fourth and fifth fingers. The swelling limits her son. Sensation is not normal in the left thumb, with decrease sensation. The other wounds on her arms are healing nicely, and the bruising is still present but gradually resolving.  Assessment: Multiple dog bite injuries Numbness left thumb Decreased range of motion left thumb and right fourth and fifth fingers  Plan: Refer to hand physical therapy Return for a recheck in a couple of weeks Return at anytime if concerns and things are worse It will take time for neurologic function to recover from the numbness of the left thumb, but I informed her that usually these things gradually resolve with time

## 2014-10-02 NOTE — Patient Instructions (Addendum)
Continue to try to work the injured fingers.  The numbness sensation should gradually recover in the left thumb  Referral is being made to hand physical therapy  Plan to return in 2-3 weeks to be rechecked , sooner if problems

## 2014-10-08 ENCOUNTER — Telehealth: Payer: Self-pay | Admitting: Neurology

## 2014-10-08 MED ORDER — AMPHETAMINE-DEXTROAMPHET ER 25 MG PO CP24: ORAL_CAPSULE | ORAL | Status: DC

## 2014-10-08 NOTE — Telephone Encounter (Signed)
Last r/f 08-07-14.  Rx. printed, signed, up front [facility].  Pt. aware/fim

## 2014-10-08 NOTE — Telephone Encounter (Signed)
Pt is calling requesting a written Rx for amphetamine-dextroamphetamine (ADDERALL XR) 25 MG 24 hr capsule. Please call when ready for pick up.

## 2014-10-10 ENCOUNTER — Other Ambulatory Visit: Payer: Self-pay | Admitting: *Deleted

## 2014-10-10 MED ORDER — AMPHETAMINE-DEXTROAMPHETAMINE 20 MG PO TABS: 20.0000 mg | ORAL_TABLET | Freq: Two times a day (BID) | ORAL | Status: DC

## 2014-10-10 NOTE — Telephone Encounter (Signed)
Pt. requesting rx. for Adderall IR instead of Adderall ER.  Per RAS, ok for Adderall  bid.  Rx. printed, signed, given directly to pt.  Rx. for Adderall printed on 10-08-14 destroyed in the office; in shredder/fim

## 2014-11-11 ENCOUNTER — Ambulatory Visit (INDEPENDENT_AMBULATORY_CARE_PROVIDER_SITE_OTHER): Payer: BLUE CROSS/BLUE SHIELD | Admitting: Family Medicine

## 2014-11-11 VITALS — BP 120/90 | HR 85 | Temp 98.4°F | Resp 16 | Ht 63.0 in | Wt 175.0 lb

## 2014-11-11 DIAGNOSIS — M6289 Other specified disorders of muscle: Secondary | ICD-10-CM | POA: Diagnosis not present

## 2014-11-11 DIAGNOSIS — S61452S Open bite of left hand, sequela: Secondary | ICD-10-CM

## 2014-11-11 DIAGNOSIS — W540XXA Bitten by dog, initial encounter: Secondary | ICD-10-CM | POA: Diagnosis not present

## 2014-11-11 DIAGNOSIS — S61451S Open bite of right hand, sequela: Secondary | ICD-10-CM

## 2014-11-11 DIAGNOSIS — R29898 Other symptoms and signs involving the musculoskeletal system: Secondary | ICD-10-CM

## 2014-11-11 DIAGNOSIS — R208 Other disturbances of skin sensation: Secondary | ICD-10-CM | POA: Diagnosis not present

## 2014-11-11 DIAGNOSIS — L603 Nail dystrophy: Secondary | ICD-10-CM | POA: Diagnosis not present

## 2014-11-11 DIAGNOSIS — R200 Anesthesia of skin: Secondary | ICD-10-CM

## 2014-11-11 NOTE — Patient Instructions (Signed)
Continue exercising the right hand  Return in about one month for a follow-up, sooner if concerns  Return to regular duty at work

## 2014-11-11 NOTE — Progress Notes (Signed)
Subjective: Patient is here for follow-up with regard to her dog bites. She continues to have problems with numbness in the left thumb. 3 of her fingernails still have holes in them where she was bitten, very slowly growing outward. She has had decreased grip in the right fourth and fifth finger area. She has just completed physical therapy on this and they have advise continued self therapy regarding this. She did go back to her 1 job at [employer], but is not gone back yet to the other job where she does a lot more physical lifting. I given her an excuse through the first week of this month, but she has not felt like she could go back to work yet. She feels like she probably can handle it now. She needs a release to go back to work. She feels like she is handling this emotionally satisfactorily. It is been hard, but she is doing okay.  Objective: All of the wounds are healed up now, with the bruising essentially gone. Nothing is open any longer except for the 3 little puncture wounds in her right index and ring fingers and left thumb. Grip strength seems fairly good. Sensation is subjectively not right in the left thumb, though she has good motion of it.  Assessment: Dog bites Emotional trauma secondary to bites  dystrophic nails secondary to bites Numbness left thumb Decreased grip strength right fourth and fifth fingers Scar tissue is especially symptomatic in the right finger and left thumb, with being palpable elsewhere also.   Plan: Resume regular duties at work Follow-up one more time for a reverification of the healing process  Attempted to get her a copy of the pictures. Was unable to do so yet, but will try to get them on a flash drive some time.

## 2014-11-28 ENCOUNTER — Other Ambulatory Visit: Payer: Self-pay | Admitting: Neurology

## 2014-11-28 MED ORDER — AMPHETAMINE-DEXTROAMPHETAMINE 20 MG PO TABS: 20.0000 mg | ORAL_TABLET | Freq: Two times a day (BID) | ORAL | Status: DC

## 2014-11-28 NOTE — Telephone Encounter (Signed)
Patient called requesting refill for amphetamine-dextroamphetamine (ADDERALL) 20 MG tablet . She does not want ER. Patient advised RX will be ready within 24 hours unless informed otherwise by RN. Patient can be reached at [phone].

## 2014-11-28 NOTE — Telephone Encounter (Signed)
Request entered, forwarded to provider for approval.  

## 2014-11-29 NOTE — Telephone Encounter (Signed)
Spoke with Irving Burton and advised rx. is available to p/u in office today from 8am-12noon/fim

## 2015-01-13 ENCOUNTER — Encounter: Payer: Self-pay | Admitting: *Deleted

## 2015-01-13 ENCOUNTER — Other Ambulatory Visit: Payer: Self-pay | Admitting: Neurology

## 2015-01-13 MED ORDER — AMPHETAMINE-DEXTROAMPHETAMINE 20 MG PO TABS: 20.0000 mg | ORAL_TABLET | Freq: Two times a day (BID) | ORAL | Status: DC

## 2015-01-13 NOTE — Telephone Encounter (Signed)
Patient called and requested to get a refill for Rx. amphetamine-dextroamphetamine (ADDERALL) 20 MG tablet.

## 2015-01-13 NOTE — Telephone Encounter (Signed)
Request entered, forwarded to provider for approval.  

## 2015-01-13 NOTE — Progress Notes (Signed)
Adderall rx. up front [facility]/fim 

## 2015-01-15 ENCOUNTER — Ambulatory Visit (INDEPENDENT_AMBULATORY_CARE_PROVIDER_SITE_OTHER): Payer: BLUE CROSS/BLUE SHIELD | Admitting: Neurology

## 2015-01-15 ENCOUNTER — Encounter: Payer: Self-pay | Admitting: Neurology

## 2015-01-15 VITALS — BP 126/90 | HR 72 | Resp 16 | Ht 63.0 in | Wt 174.4 lb

## 2015-01-15 DIAGNOSIS — M5441 Lumbago with sciatica, right side: Secondary | ICD-10-CM | POA: Diagnosis not present

## 2015-01-15 DIAGNOSIS — M79606 Pain in leg, unspecified: Secondary | ICD-10-CM | POA: Diagnosis not present

## 2015-01-15 DIAGNOSIS — F32A Depression, unspecified: Secondary | ICD-10-CM

## 2015-01-15 DIAGNOSIS — G35 Multiple sclerosis: Secondary | ICD-10-CM | POA: Diagnosis not present

## 2015-01-15 DIAGNOSIS — F329 Major depressive disorder, single episode, unspecified: Secondary | ICD-10-CM | POA: Diagnosis not present

## 2015-01-15 DIAGNOSIS — R350 Frequency of micturition: Secondary | ICD-10-CM

## 2015-01-15 DIAGNOSIS — R5383 Other fatigue: Secondary | ICD-10-CM | POA: Diagnosis not present

## 2015-01-15 MED ORDER — MELOXICAM 15 MG PO TABS: 15.0000 mg | ORAL_TABLET | Freq: Every day | ORAL | Status: DC

## 2015-01-15 MED ORDER — GABAPENTIN 300 MG PO CAPS: 300.0000 mg | ORAL_CAPSULE | Freq: Three times a day (TID) | ORAL | Status: DC

## 2015-01-15 MED ORDER — AMPHETAMINE-DEXTROAMPHETAMINE 20 MG PO TABS: 20.0000 mg | ORAL_TABLET | Freq: Two times a day (BID) | ORAL | Status: DC

## 2015-01-15 NOTE — Progress Notes (Signed)
[facility]  PATIENT: Mary Beck DOB: [birth year]  REFERRING DOCTOR OR PCP:   None  SOURCE: Patient  _________________________________   HISTORICAL  CHIEF COMPLAINT:  Chief Complaint  Patient presents with  . Multiple Sclerosis    Sts. she continues to tolerate Gilenya well.  She doesn't feel depression is improved with increase in Wellbutrin.  She sts. bladder function is greatly improved with increase in Oxybutynin.  Sts. she only uses Ambien prn, and it is effective when she needs it.  Sts. she has more "achiness" in bilat upper legs.  Sts. she also thinks bilat legs are weaker--thinks right leg is some weaker than left. Sts. she has had some intermittent lbp over the last 2 yrs that radiates down back of right leg to just  . Depression    above knee./fim  . Back Pain    HISTORY OF PRESENT ILLNESS:  Mary Beck is a 39 year old woman who was diagnosed with MS around 2010.   Since her last visit, she is reporting more leg pain (bilateral).   She has a crampy achy pain in both legs.   She also has some back pain that radiates into the right leg at times.      Her MS has been mostly stable with no recent exacerbation.   She is on Gilenya and tolerates it wlel  Gait/strength/senation:  She notes gait is mildly off balanced but mostly unchanged.   Her right hand numbness never fully recovered from her major exacerbation.  She has some numbness in the left thumb also. She notes no numbness or weakness in her legs.  But has pain as detailed above.  Vision:   She denies any current visual symptoms. However, she did have pseudotumor cerebri about 15 or 20 years ago and had papilledema. She had a lot of headaches at that time. She had serial lumbar punctures performed and was placed on Diamox and her symptoms resolved. Later on, she lost 70 pounds and her symptoms got better.   She occasionally still gets headaches but no new visual changes.     Bladder:  The  bladder urgency is much better since starting oxybutynin -  She takes one or two 5 mg tablets daily.    Fatigue/sleep:   She reports some fatigue, worse with the heat.    She has some sleep onset insomnia, helped by Ambien.  She tolerates it well and had no hangover effect.   Adderall 25 mg daily has helped her fatigue during the day  Mood/cognition:  She has had some  depression. She denies anxiety. She is currently on Wellbutrin with mild benefit.   She tolerates Wellbutrin well.  She had no additional benefit with an SSRI in the past.   She has mild cognitive issues. Specifically she finds her processing speed is reduced. She has noted some verbal fluency with difficulty getting the right word out. Adderall has helped.   .  MS HIstory:   She was diagnosed in 2010 after presenting with numbness in the feet up to her knees. She had an MRI of the brain that was abnormal. This was followed by a lumbar puncture in the CSF was also abnormal. She was diagnosed with MS and was placed on Copaxone. She had an exacerbation and was switched to Betaseron. Due to needle fatigue she was switched to Gilenya about 2 years ago. Since being on Gilenya her MS has been stable. She denies any exacerbations. She tolerates Gilenya well. She  had an MRI of the brain early 2015 that was unchanged compared to her previous MRI.   REVIEW OF SYSTEMS: Constitutional: No fevers, chills, sweats, or change in appetite.   She reports fatigue Eyes: No visual changes, double vision, .  However, mild eye pain Ear, nose and throat: No hearing loss, ear pain, nasal congestion, sore throat Cardiovascular: No chest pain, palpitations Respiratory: No shortness of breath at rest or with exertion.   No wheezes GastrointestinaI: No nausea, vomiting, diarrhea, abdominal pain, fecal incontinence Genitourinary: No dysuria or urinary retention.  She notes frequency and nocturia. Musculoskeletal: No neck pain, back pain Integumentary: No rash,  pruritus, skin lesions Neurological: as above.  Also, occasional Headache Psychiatric: Reports depression but little anxiety Endocrine: No palpitations, diaphoresis, change in appetite, change in weigh or increased thirst Hematologic/Lymphatic: No anemia, purpura, petechiae. Allergic/Immunologic: No itchy/runny eyes, nasal congestion, recent allergic reactions, rashes  ALLERGIES: No Known Allergies  HOME MEDICATIONS:  Current outpatient prescriptions:  .  amphetamine-dextroamphetamine (ADDERALL) 20 MG tablet, Take 1 tablet (20 mg total) by mouth 2 (two) times daily., Disp: 60 tablet, Rfl: 0 .  buPROPion (WELLBUTRIN XL) 300 MG 24 hr tablet, Take 1 tablet (300 mg total) by mouth daily., Disp: 30 tablet, Rfl: 11 .  cholecalciferol (VITAMIN D) 1000 UNITS tablet, Take 1,000 Units by mouth daily. Take 5 capsules daily, Disp: , Rfl:  .  Fingolimod HCl (GILENYA) 0.5 MG CAPS, Take 1 capsule by mouth daily., Disp: , Rfl:  .  ibuprofen (ADVIL,MOTRIN) 200 MG tablet, Take 600 mg by mouth every 6 (six) hours as needed. Patient used this medication for pain., Disp: , Rfl:  .  loratadine (CLARITIN) 10 MG tablet, Take 10 mg by mouth daily., Disp: , Rfl:  .  oxybutynin (DITROPAN) 5 MG tablet, Take 1 tablet (5 mg total) by mouth 3 (three) times daily., Disp: 90 tablet, Rfl: 11 .  PROAIR HFA 108 (90 BASE) MCG/ACT inhaler, , Disp: , Rfl: 0 .  zolpidem (AMBIEN) 5 MG tablet, Take 1 tablet (5 mg total) by mouth at bedtime as needed for sleep., Disp: 30 tablet, Rfl: 5  PAST MEDICAL HISTORY: Past Medical History  Diagnosis Date  . Multiple sclerosis   . Pseudotumor cerebri   . Headache     PAST SURGICAL HISTORY: History reviewed. No pertinent past surgical history.  FAMILY HISTORY: Family History  Problem Relation Age of Onset  . Multiple sclerosis Neg Hx     SOCIAL HISTORY:  History   Social History  . Marital Status: Single    Spouse Name: N/A  . Number of Children: N/A  . Years of  Education: N/A   Occupational History  . Not on file.   Social History Main Topics  . Smoking status: Never Smoker   . Smokeless tobacco: Never Used  . Alcohol Use: No  . Drug Use: No  . Sexual Activity: Yes    Birth Control/ Protection: None   Other Topics Concern  . Not on file   Social History Narrative     PHYSICAL EXAM  Filed Vitals:   01/15/15 1612  BP: 126/90  Pulse: 72  Resp: 16  Height: 5\' 3"  (1.6 m)  Weight: 174 lb 6.4 oz (79.107 kg)    Body mass index is 30.9 kg/(m^2).   General: The patient is well-developed and well-nourished and in no acute distress  Skin: Extremities are without significant edema.  Musculoskeletal:  Back is tender over the lower lumbar paraspinal muscles more than the  piriformis muscles. There is no tenderness over the trochanteric bursae.  Neurologic Exam  Mental status: The patient is alert and oriented x 3 at the time of the examination. The patient has apparent normal recent and remote memory, with an apparently normal attention span and concentration ability.   Speech is normal.  Cranial nerves: Extraocular movements are full.     Facial symmetry is present. There is good facial sensation to soft touch bilaterally.Facial strength is normal.  Trapezius and sternocleidomastoid strength is normal. No dysarthria is noted.    No obvious hearing deficits are noted.  Motor:  Muscle bulk is normal.   Tone is normal. Strength is  5 / 5 in all 4 extremities.   Sensory: Sensory testing showed mild reduced touch and vibration sensation in the right hand  compared to the left. Legs were normal.    Coordination: Cerebellar testing reveals good finger-nose-finger and heel-to-shin bilaterally.  Gait and station: Station is normal.   Gait is normal. Tandem gait is mildly wide.    Reflexes: Deep tendon reflexes are normal and symmetric in the arms. Reflexes were increased in the knees, a little more on the left with there was spread.Marland Kitchen         DIAGNOSTIC DATA (LABS, IMAGING, TESTING) - I reviewed patient records, labs, notes, testing and imaging myself where available.     ASSESSMENT AND PLAN  MS (multiple sclerosis)  Other fatigue  Urinary frequency  Depression  Right-sided low back pain with right-sided sciatica  Pain of lower extremity, unspecified laterality    1.  Gabapentin and meloxicam for the lower back pain and dysesthesia 2.   Continue Fingolimod.    3.   Continue Adderall 4.   rtc 5 months or sooner if new or worsening neurologic symptoms  Catrena Vari A. Epimenio Foot, MD, PhD 01/15/2015, 4:22 PM Certified in Neurology, Clinical Neurophysiology, Sleep Medicine, Pain Medicine and Neuroimaging  [facility] [address], [address] [place], [place] [address] [phone]

## 2015-02-07 ENCOUNTER — Ambulatory Visit: Payer: BLUE CROSS/BLUE SHIELD | Admitting: Neurology

## 2015-02-10 ENCOUNTER — Ambulatory Visit: Payer: BLUE CROSS/BLUE SHIELD | Admitting: Neurology

## 2015-02-25 ENCOUNTER — Other Ambulatory Visit: Payer: Self-pay | Admitting: Neurology

## 2015-02-26 ENCOUNTER — Encounter: Payer: Self-pay | Admitting: *Deleted

## 2015-02-26 NOTE — Progress Notes (Unsigned)
Subjective:    Patient ID: Mary Beck is a 39 y.o. female.  HPI {Common ambulatory SmartLinks:[id number]}  Review of Systems  Objective:  Neurologic Exam  Physical Exam  Assessment:   ***  Plan:   ***

## 2015-02-26 NOTE — Progress Notes (Unsigned)
Ambien rx. up front [facility]/fim 

## 2015-03-05 ENCOUNTER — Other Ambulatory Visit: Payer: Self-pay

## 2015-03-05 ENCOUNTER — Other Ambulatory Visit: Payer: Self-pay | Admitting: Neurology

## 2015-03-05 MED ORDER — AMPHETAMINE-DEXTROAMPHETAMINE 20 MG PO TABS: 20.0000 mg | ORAL_TABLET | Freq: Two times a day (BID) | ORAL | Status: DC

## 2015-03-05 NOTE — Telephone Encounter (Signed)
Patient called requesting refill Fingolimod HCl (GILENYA) 0.5 MG CAPS and amphetamine-dextroamphetamine (ADDERALL) 20 MG tablet

## 2015-03-05 NOTE — Telephone Encounter (Signed)
I called and spoke with the patient.  She would like to pick up the written Rx for Adderall and wants a separate Rx written for Gilenya to be sent to [employer]

## 2015-03-06 ENCOUNTER — Other Ambulatory Visit: Payer: Self-pay | Admitting: *Deleted

## 2015-03-06 MED ORDER — FINGOLIMOD HCL 0.5 MG PO CAPS: 1.0000 | ORAL_CAPSULE | Freq: Every day | ORAL | Status: DC

## 2015-03-06 NOTE — Telephone Encounter (Signed)
Adderall rx. up front [facility]/fim 

## 2015-03-28 ENCOUNTER — Telehealth: Payer: Self-pay | Admitting: Neurology

## 2015-03-28 NOTE — Telephone Encounter (Signed)
Pt. presented to our office and picked up completed paper for the [employer]/fim

## 2015-03-28 NOTE — Telephone Encounter (Signed)
Patient is calling regarding a paper that was to be signed by Dr. Epimenio Foot about the patient attending a water aerobics class. Please call.

## 2015-06-02 ENCOUNTER — Telehealth: Payer: Self-pay | Admitting: Neurology

## 2015-06-02 MED ORDER — AMPHETAMINE-DEXTROAMPHETAMINE 20 MG PO TABS: 20.0000 mg | ORAL_TABLET | Freq: Two times a day (BID) | ORAL | Status: DC

## 2015-06-02 NOTE — Telephone Encounter (Signed)
Patient is calling for a written Rx Amphetamine-dextroamphetamine 20 mg tablets.  Thanks!

## 2015-06-02 NOTE — Telephone Encounter (Signed)
Adderall rx. printed, signed, up front [facility]/fim 

## 2015-06-18 ENCOUNTER — Ambulatory Visit: Payer: BLUE CROSS/BLUE SHIELD | Admitting: Neurology

## 2015-06-19 ENCOUNTER — Ambulatory Visit (INDEPENDENT_AMBULATORY_CARE_PROVIDER_SITE_OTHER): Payer: BLUE CROSS/BLUE SHIELD | Admitting: Neurology

## 2015-06-19 ENCOUNTER — Encounter: Payer: Self-pay | Admitting: Neurology

## 2015-06-19 VITALS — BP 127/87 | HR 89 | Ht 63.0 in | Wt 172.4 lb

## 2015-06-19 DIAGNOSIS — F329 Major depressive disorder, single episode, unspecified: Secondary | ICD-10-CM

## 2015-06-19 DIAGNOSIS — R5383 Other fatigue: Secondary | ICD-10-CM | POA: Diagnosis not present

## 2015-06-19 DIAGNOSIS — M79606 Pain in leg, unspecified: Secondary | ICD-10-CM | POA: Diagnosis not present

## 2015-06-19 DIAGNOSIS — M5441 Lumbago with sciatica, right side: Secondary | ICD-10-CM

## 2015-06-19 DIAGNOSIS — F32A Depression, unspecified: Secondary | ICD-10-CM

## 2015-06-19 DIAGNOSIS — R350 Frequency of micturition: Secondary | ICD-10-CM | POA: Diagnosis not present

## 2015-06-19 DIAGNOSIS — G35 Multiple sclerosis: Secondary | ICD-10-CM | POA: Diagnosis not present

## 2015-06-19 DIAGNOSIS — G35D Multiple sclerosis, unspecified: Secondary | ICD-10-CM

## 2015-06-19 MED ORDER — CYCLOBENZAPRINE HCL 5 MG PO TABS: 5.0000 mg | ORAL_TABLET | Freq: Three times a day (TID) | ORAL | Status: DC

## 2015-06-19 MED ORDER — PROAIR HFA 108 (90 BASE) MCG/ACT IN AERS
2.0000 | INHALATION_SPRAY | Freq: Four times a day (QID) | RESPIRATORY_TRACT | Status: DC | PRN
Start: 2015-06-19 — End: 2021-10-28

## 2015-06-19 MED ORDER — ZOLPIDEM TARTRATE 5 MG PO TABS: 5.0000 mg | ORAL_TABLET | Freq: Every evening | ORAL | Status: DC | PRN

## 2015-06-19 MED ORDER — OXYBUTYNIN CHLORIDE 5 MG PO TABS: 5.0000 mg | ORAL_TABLET | Freq: Three times a day (TID) | ORAL | Status: DC

## 2015-06-19 MED ORDER — AMPHETAMINE-DEXTROAMPHETAMINE 20 MG PO TABS: 20.0000 mg | ORAL_TABLET | Freq: Two times a day (BID) | ORAL | Status: DC

## 2015-06-19 MED ORDER — BUPROPION HCL ER (XL) 300 MG PO TB24: 300.0000 mg | ORAL_TABLET | Freq: Every day | ORAL | Status: DC

## 2015-06-19 NOTE — Patient Instructions (Signed)
Piriformis Stretch  Lie on your back. ...  Cross your left leg over your right so that your left ankle rests on your right knee.  Use your hands to grab hold of your left knee and pull it gently toward the opposite shoulder. ...  Hold for 15 to 30 seconds.  Relax, and then repeat with the other leg.  Repeat this cycle 2 to 4 times.   Check on at [facility] or other sites for piriformis stretch exercises.

## 2015-06-19 NOTE — Progress Notes (Signed)
[facility]  PATIENT: Mary Beck DOB: 11-19-1975   _________________________________   HISTORICAL  CHIEF COMPLAINT:  Chief Complaint  Patient presents with  . Follow-up    MS follow up    HISTORY OF PRESENT ILLNESS:  Mary Beck is a 39 year old woman who was diagnosed with MS around 2010.     LBP/leg pain:   She still has some LB and leg pain.   Pain is a crampy ache in both legs.   She also has some back pain that radiates into the right leg at times.    Meloxicam caused mild stomach upset.    Gabapentin causes sleepiness but has helped the pain some (takes one most days --- two makes her too sleepy).     She also has bilateral hip pain that is intermittent since the last visit.    MS:   Her MS has been mostly stable with no recent exacerbation.   She is on Gilenya and tolerates it wlel  Gait/strength/senation:  She notes gait is mildly off balanced and left leg slightly weak (goves out on her).   No arm weakness.   Her right hand numbness never fully recovered from her major exacerbation.    She notes no numbness in her legs.    Vision:   She denies any current visual symptoms. However, she did have pseudotumor cerebri about 15 or 20 years ago and had serial LP's and Diamox.    Later on, she lost 70 pounds and her symptoms got better allowing her to stop medications.   She occasionally still gets headaches but no new visual changes.     Bladder:  The bladder urgency is much better since starting oxybutynin -  She takes one or two 5 mg tablets daily.    Fatigue/sleep:   She reports some fatigue, worse with the heat.    She has some sleep onset insomnia, helped by Ambien.  She tolerates it well and had no hangover effect.   Adderall 25 mg daily has helped her fatigue during the day  Mood/cognition:  She has had some depression. She denies anxiety. She is currently on Wellbutrin with mild benefit.   She tolerates Wellbutrin well.   She notes mild  cognitive issues verbal fluency and multitasking issues. Adderall has helped.   .  MS HIstory:   She was diagnosed in 2010 after presenting with numbness in the feet up to her knees. She had an MRI of the brain that was abnormal. This was followed by a lumbar puncture in the CSF was also abnormal. She was diagnosed with MS and was placed on Copaxone. She had an exacerbation and was switched to Betaseron. Due to needle fatigue she was switched to Gilenya about 2 years ago. Since being on Gilenya her MS has been stable. She denies any exacerbations. She tolerates Gilenya well. She had an MRI of the brain early 2015 that was unchanged compared to her previous MRI.   REVIEW OF SYSTEMS: Constitutional: No fevers, chills, sweats, or change in appetite.   She reports fatigue Eyes: No visual changes, double vision, .  However, mild eye pain Ear, nose and throat: No hearing loss, ear pain, nasal congestion, sore throat Cardiovascular: No chest pain, palpitations Respiratory: No shortness of breath at rest or with exertion.   No wheezes GastrointestinaI: No nausea, vomiting, diarrhea, abdominal pain, fecal incontinence Genitourinary: No dysuria or urinary retention.  She notes frequency and nocturia. Musculoskeletal: No neck pain.  Notes back  pain Integumentary: No rash, pruritus, skin lesions Neurological: as above.  Also, occasional Headache Psychiatric: Reports mild depression but little anxiety Endocrine: No palpitations, diaphoresis, change in appetite, change in weigh or increased thirst Hematologic/Lymphatic: No anemia, purpura, petechiae. Allergic/Immunologic: No itchy/runny eyes, nasal congestion, recent allergic reactions, rashes  ALLERGIES: No Known Allergies  HOME MEDICATIONS:  Current outpatient prescriptions:  .  amphetamine-dextroamphetamine (ADDERALL) 20 MG tablet, Take 1 tablet (20 mg total) by mouth 2 (two) times daily., Disp: 60 tablet, Rfl: 0 .  buPROPion (WELLBUTRIN XL) 300  MG 24 hr tablet, Take 1 tablet (300 mg total) by mouth daily., Disp: 30 tablet, Rfl: 11 .  cholecalciferol (VITAMIN D) 1000 UNITS tablet, Take 2,000 Units by mouth daily. Take 1 capsules daily, Disp: , Rfl:  .  Fingolimod HCl (GILENYA) 0.5 MG CAPS, Take 1 capsule (0.5 mg total) by mouth daily., Disp: 90 capsule, Rfl: 3 .  gabapentin (NEURONTIN) 300 MG capsule, Take 1 capsule (300 mg total) by mouth 3 (three) times daily. (Patient taking differently: Take 300 mg by mouth 3 (three) times daily. Takes as needed), Disp: 90 capsule, Rfl: 11 .  ibuprofen (ADVIL,MOTRIN) 200 MG tablet, Take 600 mg by mouth every 6 (six) hours as needed. Patient used this medication for pain., Disp: , Rfl:  .  loratadine (CLARITIN) 10 MG tablet, Take 10 mg by mouth daily., Disp: , Rfl:  .  meloxicam (MOBIC) 15 MG tablet, Take 1 tablet (15 mg total) by mouth daily. (Patient taking differently: Take 15 mg by mouth daily. As needed), Disp: 90 tablet, Rfl: 0 .  oxybutynin (DITROPAN) 5 MG tablet, Take 1 tablet (5 mg total) by mouth 3 (three) times daily., Disp: 90 tablet, Rfl: 11 .  PROAIR HFA 108 (90 BASE) MCG/ACT inhaler, , Disp: , Rfl: 0 .  zolpidem (AMBIEN) 5 MG tablet, TAKE 1 TABLET BY MOUTH EVERY DAY AT BEDTIME AS NEEDED FOR SLEEP, Disp: 30 tablet, Rfl: 3  PAST MEDICAL HISTORY: Past Medical History  Diagnosis Date  . Multiple sclerosis (HCC)   . Pseudotumor cerebri   . Headache     PAST SURGICAL HISTORY: History reviewed. No pertinent past surgical history.  FAMILY HISTORY: Family History  Problem Relation Age of Onset  . Multiple sclerosis Neg Hx     SOCIAL HISTORY:  Social History   Social History  . Marital Status: Single    Spouse Name: N/A  . Number of Children: N/A  . Years of Education: N/A   Occupational History  . Not on file.   Social History Main Topics  . Smoking status: Never Smoker   . Smokeless tobacco: Never Used  . Alcohol Use: No  . Drug Use: No  . Sexual Activity: Yes     Birth Control/ Protection: None   Other Topics Concern  . Not on file   Social History Narrative     PHYSICAL EXAM  Filed Vitals:   06/19/15 1421  BP: 127/87  Pulse: 89  Height: 5\' 3"  (1.6 m)  Weight: 172 lb 6.4 oz (78.2 kg)    Body mass index is 30.55 kg/(m^2).   General: The patient is well-developed and well-nourished and in no acute distress   Musculoskeletal:  Back is tender over the lower lumbar paraspinal muscles and the left piriformis muscles. There is no tenderness over the trochanteric bursae.  Neurologic Exam  Mental status: The patient is alert and oriented x 3 at the time of the examination. The patient has apparent normal recent and  remote memory, with an apparently normal attention span and concentration ability.   Speech is normal.  Cranial nerves: Extraocular movements are full.     Facial symmetry is present. There is good facial sensation to soft touch bilaterally.Facial strength is normal.  Trapezius and sternocleidomastoid strength is normal. No dysarthria is noted.    No obvious hearing deficits are noted.  Motor:  Muscle bulk is normal.   Tone is normal. Strength is  5 / 5 in all 4 extremities.   Sensory: Sensory testing showed mild reduced touch and vibration sensation in the right hand  compared to the left. Legs were normal.    Coordination: Cerebellar testing reveals good finger-nose-finger and heel-to-shin bilaterally.  Gait and station: Station is normal.   Gait is normal. Tandem gait is mildly wide.    Reflexes: Deep tendon reflexes are normal and symmetric in the arms. Reflexes were increased in the knees, spread on the left.Marland Kitchen        DIAGNOSTIC DATA (LABS, IMAGING, TESTING) - I reviewed patient records, labs, notes, testing and imaging myself where available.     ASSESSMENT AND PLAN  MS (multiple sclerosis) (HCC)  Other fatigue  Urinary frequency  Right-sided low back pain with right-sided sciatica  Depression    1.   Cyclobenzaprine  for the lower back pain and dysesthesia.  Also instructed on piriformis stretches 2.   Continue Fingolimod ---  Patient assistance form filled out.    3.   Continue Adderall 4.   Renew med's rtc 5 months or sooner if new or worsening neurologic symptoms  Richard A. Epimenio Foot, MD, PhD 06/19/2015, 2:30 PM Certified in Neurology, Clinical Neurophysiology, Sleep Medicine, Pain Medicine and Neuroimaging  [facility] [address] [place], [place] [address] [phone]

## 2015-06-22 HISTORY — PX: BACK SURGERY: SHX140

## 2015-07-29 ENCOUNTER — Telehealth: Payer: Self-pay | Admitting: Neurology

## 2015-07-29 NOTE — Telephone Encounter (Signed)
Penny/[employer] [phone] called to inquire if PA was started for Gilenya for patient.

## 2015-07-29 NOTE — Telephone Encounter (Signed)
I attempted to return Penny's call, but was on hold for greater than 11 min.  Was not able to hold any longer/fim

## 2015-07-30 ENCOUNTER — Encounter: Payer: Self-pay | Admitting: *Deleted

## 2015-09-03 NOTE — Telephone Encounter (Signed)
Randa Evens with [employer] is calling back regarding prior authorization for medication Fingolimod HCl (GILENYA) 0.5 MG CAPS for the patient. She states this needs to be called to [employer]. [phone] to initiate the prior authorization for Fingolimod HCl (GILENYA) 0.5 MG CAPS for the  brand name.  I advised a call was returned from our office but never got through on 07-29-15. Thank you.

## 2015-09-03 NOTE — Telephone Encounter (Signed)
I have spoken with Mary Beck this afternoon and advised that PA was complete in Feb.  Effective dates and PA # given/fim

## 2015-09-03 NOTE — Telephone Encounter (Signed)
Randa Evens with [employer]

## 2015-09-09 ENCOUNTER — Telehealth: Payer: Self-pay | Admitting: Neurology

## 2015-09-09 NOTE — Telephone Encounter (Signed)
Patient is calling to order a written Rx amphetamine-dextroamphetamine 20 mg.  Thanks!

## 2015-09-10 MED ORDER — AMPHETAMINE-DEXTROAMPHETAMINE 20 MG PO TABS: 20.0000 mg | ORAL_TABLET | Freq: Two times a day (BID) | ORAL | Status: DC

## 2015-09-10 NOTE — Telephone Encounter (Signed)
Rx. awaiting RAS sig/fim 

## 2015-09-12 NOTE — Telephone Encounter (Signed)
Adderall rx. was placed up front [facility] on 09-10-15/fim

## 2015-09-19 DIAGNOSIS — M549 Dorsalgia, unspecified: Secondary | ICD-10-CM

## 2015-09-19 DIAGNOSIS — G8929 Other chronic pain: Secondary | ICD-10-CM | POA: Insufficient documentation

## 2015-09-19 DIAGNOSIS — G4700 Insomnia, unspecified: Secondary | ICD-10-CM | POA: Insufficient documentation

## 2015-09-19 DIAGNOSIS — J302 Other seasonal allergic rhinitis: Secondary | ICD-10-CM | POA: Insufficient documentation

## 2015-09-24 ENCOUNTER — Encounter: Payer: Self-pay | Admitting: *Deleted

## 2015-10-21 ENCOUNTER — Ambulatory Visit (INDEPENDENT_AMBULATORY_CARE_PROVIDER_SITE_OTHER): Payer: BLUE CROSS/BLUE SHIELD | Admitting: Neurology

## 2015-10-21 ENCOUNTER — Encounter: Payer: Self-pay | Admitting: Neurology

## 2015-10-21 VITALS — BP 128/90 | HR 78 | Resp 16 | Ht 63.0 in | Wt 172.0 lb

## 2015-10-21 DIAGNOSIS — G35 Multiple sclerosis: Secondary | ICD-10-CM

## 2015-10-21 DIAGNOSIS — M5441 Lumbago with sciatica, right side: Secondary | ICD-10-CM

## 2015-10-21 DIAGNOSIS — R5383 Other fatigue: Secondary | ICD-10-CM

## 2015-10-21 DIAGNOSIS — M7138 Other bursal cyst, other site: Secondary | ICD-10-CM

## 2015-10-21 DIAGNOSIS — Q760 Spina bifida occulta: Secondary | ICD-10-CM | POA: Insufficient documentation

## 2015-10-21 DIAGNOSIS — G8929 Other chronic pain: Secondary | ICD-10-CM

## 2015-10-21 DIAGNOSIS — M549 Dorsalgia, unspecified: Secondary | ICD-10-CM | POA: Diagnosis not present

## 2015-10-21 MED ORDER — AMPHETAMINE-DEXTROAMPHETAMINE 20 MG PO TABS: 20.0000 mg | ORAL_TABLET | Freq: Two times a day (BID) | ORAL | Status: DC

## 2015-10-21 MED ORDER — CYCLOBENZAPRINE HCL 5 MG PO TABS: 5.0000 mg | ORAL_TABLET | Freq: Three times a day (TID) | ORAL | Status: DC

## 2015-10-21 MED ORDER — MELOXICAM 15 MG PO TABS: 15.0000 mg | ORAL_TABLET | Freq: Every day | ORAL | Status: DC

## 2015-10-21 MED ORDER — GABAPENTIN 300 MG PO CAPS: 300.0000 mg | ORAL_CAPSULE | Freq: Four times a day (QID) | ORAL | Status: DC

## 2015-10-21 MED ORDER — METHYLPREDNISOLONE 4 MG PO TABS: ORAL_TABLET | ORAL | Status: DC

## 2015-10-21 NOTE — Progress Notes (Signed)
[facility]  PATIENT: Mary Beck DOB: 1975/12/16   _________________________________   HISTORICAL  CHIEF COMPLAINT:  Chief Complaint  Patient presents with  . Multiple Sclerosis    Sts. she continues to tolerate Gilenya well and denies missed doses.  Today she c/o increased spasticity, pain in legs over the last 1-2 weeks, and numbness in right hand.  No increased difficulty walking except that walking is painful.  No trouble with balance./fim    HISTORY OF PRESENT ILLNESS:  Mary Beck is a 40 year old woman who was diagnosed with MS around 2010.   She is noting more spasticity in her gluteal muscles, left currently worse.    She also notes more hand numbness than usual.    She is having more pain in her hips and thighs.    Leg pain is a little burning.  She gets radiating pain into right leg, more than in the past.    Meloxicam caused mild stomach upset.    Gabapentin is helping some as she is now taking tid.     I personally reviewed the MRI of the lumbar spine. 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   She is on Gilenya and tolerates it well.    Her last brain and cervical spine  Gait/strength/senation:  She notes gait is mildly off balanced and legs are slightly weak (left =  right).   No arm weakness.   Her right hand numbness never fully recovered from her major exacerbation and is bothering her more.    She notes no numbness in her legs though she has pain in the upper legs.     Vision:   She denies any current visual symptoms. However, she did have pseudotumor cerebri about 15 or 20 years ago and had serial LP's and Diamox.    Later on, she lost 70 pounds and her symptoms got better allowing her to stop  medications.   She occasionally still gets headaches but no new visual changes.     Bladder:  The bladder urgency is much better since starting oxybutynin -  She takes one or two 5 mg tablets daily.    Fatigue/sleep:   She reports some fatigue, worse with the heat.    She has some sleep onset insomnia, helped by Ambien.  She tolerates it well and had no hangover effect.   Adderall 25 mg daily has helped her fatigue during the day  Mood/cognition:  She has mild depression. She denies anxiety. She is currently on Wellbutrin with mild benefit.   She tolerates Wellbutrin well.   She notes mild cognitive issues verbal fluency and multitasking issues. Adderall has helped.   .  MS HIstory:   She was diagnosed in 2010 after presenting with numbness in the feet up to her knees. She had an MRI of the brain that was abnormal. This was followed by a lumbar puncture in the CSF was also abnormal. She was diagnosed with MS and was placed on Copaxone. She had an exacerbation and was switched to Betaseron. Due to needle fatigue she was switched to Gilenya about 2 years ago. Since being on Gilenya her MS has been stable. She denies any exacerbations. She tolerates Gilenya well. She had  an MRI of the brain early 2015 that was unchanged compared to her previous MRI.  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 2. Posterior element fusion anomaly/spina bifida at L3 and below. 3. Advanced facet disease in the lower lumbar spine. 4. 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     REVIEW OF SYSTEMS: Constitutional: No fevers, chills, sweats, or change in appetite.   She reports fatigue Eyes: No visual changes, double vision, .  However, mild eye pain Ear, nose and throat: No hearing loss, ear pain, nasal congestion, sore throat Cardiovascular:  No chest pain, palpitations Respiratory: No shortness of breath at rest or with exertion.   No wheezes GastrointestinaI: No nausea, vomiting, diarrhea, abdominal pain, fecal incontinence Genitourinary: No dysuria or urinary retention.  She notes frequency and nocturia. Musculoskeletal: No neck pain.  Notes back pain Integumentary: No rash, pruritus, skin lesions Neurological: as above.  Also, occasional Headache Psychiatric: Reports mild depression but little anxiety Endocrine: No palpitations, diaphoresis, change in appetite, change in weigh or increased thirst Hematologic/Lymphatic: No anemia, purpura, petechiae. Allergic/Immunologic: No itchy/runny eyes, nasal congestion, recent allergic reactions, rashes  ALLERGIES: No Known Allergies  HOME MEDICATIONS:  Current outpatient prescriptions:  .  amphetamine-dextroamphetamine (ADDERALL) 20 MG tablet, Take 1 tablet (20 mg total) by mouth 2 (two) times daily., Disp: 60 tablet, Rfl: 0 .  buPROPion (WELLBUTRIN XL) 300 MG 24 hr tablet, Take 1 tablet (300 mg total) by mouth daily., Disp: 30 tablet, Rfl: 11 .  cholecalciferol (VITAMIN D) 1000 UNITS tablet, Take 2,000 Units by mouth daily. Take 1 capsules daily, Disp: , Rfl:  .  Fingolimod HCl (GILENYA) 0.5 MG CAPS, Take 1 capsule (0.5 mg total) by mouth daily., Disp: 90 capsule, Rfl: 3 .  gabapentin (NEURONTIN) 300 MG capsule, Take 1 capsule (300 mg total) by mouth 3 (three) times daily. (Patient taking differently: Take 300 mg by mouth 3 (three) times daily. Takes as needed), Disp: 90 capsule, Rfl: 11 .  ibuprofen (ADVIL,MOTRIN) 200 MG tablet, Take 600 mg by mouth every 6 (six) hours as needed. Patient used this medication for pain., Disp: , Rfl:  .  loratadine (CLARITIN) 10 MG tablet, Take 10 mg by mouth daily., Disp: , Rfl:  .  meloxicam (MOBIC) 15 MG tablet, Take 1 tablet (15 mg total) by mouth daily. (Patient taking differently: Take 15 mg by mouth daily. As needed), Disp: 90 tablet, Rfl:  0 .  oxybutynin (DITROPAN) 5 MG tablet, Take 1 tablet (5 mg total) by mouth 3 (three) times daily., Disp: 90 tablet, Rfl: 11 .  PROAIR HFA 108 (90 Base) MCG/ACT inhaler, Inhale 2 puffs into the lungs every 6 (six) hours as needed for wheezing or shortness of breath., Disp: 6.7 g, Rfl: 1 .  zolpidem (AMBIEN) 5 MG tablet, Take 1 tablet (5 mg total) by mouth at bedtime as needed for sleep., Disp: 30 tablet, Rfl: 4 .  cyclobenzaprine (FLEXERIL) 5 MG tablet, Take 1 tablet (5 mg total) by mouth 3 (three) times daily. (Patient not taking: Reported on 10/21/2015), Disp: 90 tablet, Rfl: 3  PAST MEDICAL HISTORY: Past Medical History  Diagnosis Date  . Multiple sclerosis (HCC)   . Pseudotumor cerebri   . Headache     PAST SURGICAL HISTORY: History reviewed. No pertinent past surgical history.  FAMILY HISTORY:  Family History  Problem Relation Age of Onset  . Multiple sclerosis Neg Hx     SOCIAL HISTORY:  Social History   Social History  . Marital Status: Single    Spouse Name: N/A  . Number of Children: N/A  . Years of Education: N/A   Occupational History  . Not on file.   Social History Main Topics  . Smoking status: Never Smoker   . Smokeless tobacco: Never Used  . Alcohol Use: No  . Drug Use: No  . Sexual Activity: Yes    Birth Control/ Protection: None   Other Topics Concern  . Not on file   Social History Narrative     PHYSICAL EXAM  Filed Vitals:   10/21/15 1200  BP: 128/90  Pulse: 78  Resp: 16  Height: 5\' 3"  (1.6 m)  Weight: 172 lb (78.019 kg)    Body mass index is 30.48 kg/(m^2).   General: The patient is well-developed and well-nourished and in no acute distress   Musculoskeletal:  Back is tender over the lower lumbar paraspinal muscles and the left piriformis muscles. There is no tenderness over the trochanteric bursae.  Neurologic Exam  Mental status: The patient is alert and oriented x 3 at the time of the examination. The patient has apparent  normal recent and remote memory, with an apparently normal attention span and concentration ability.   Speech is normal.  Cranial nerves: Extraocular movements are full.     Facial symmetry is present. There is good facial sensation to soft touch bilaterally.Facial strength is normal.  Trapezius and sternocleidomastoid strength is normal. No dysarthria is noted.    No obvious hearing deficits are noted.  Motor:  Muscle bulk is normal.   Tone is normal. Strength is  5 / 5 in all 4 extremities.   Sensory: Sensory testing showed mild reduced touch and vibration sensation in the right hand  compared to the left. Legs were normal.    Coordination: Cerebellar testing reveals good finger-nose-finger and heel-to-shin bilaterally.  Gait and station: Station is normal.   Gait is normal. Tandem gait is mildly wide.    Reflexes: Deep tendon reflexes are normal and symmetric in the arms. Reflexes were increased in the knees, spread on the left.Marland Kitchen        DIAGNOSTIC DATA (LABS, IMAGING, TESTING) - I reviewed patient records, labs, notes, testing and imaging myself where available.     ASSESSMENT AND PLAN  Multiple sclerosis (HCC) - Plan: MR Brain W Wo Contrast, MR Cervical Spine W Wo Contrast  Back pain, chronic  Other fatigue  Right-sided low back pain with right-sided sciatica  Synovial cyst of lumbar facet joint  Occult spina bifida     1.   Check MRI of the brain and cervical spine to assess possibility of subclinical progression. If present, consider a change in disease modifying therapies. Cyclobenzaprine  for the lower back pain and dysesthesia.  Also instructed on piriformis stretches 2.   Continue Fingolimod ---  3.   Continue Adderall, refill 4.   Increase gabapentin to 4 pills a day and add cyclobenzaprine when necessary. 5.   We discussed the MRI findings. There are likely responsible for some of her symptoms rather than the MS. If symptoms persist she may need to see a  neurosurgeon because of the synovial cyst.   Consider ESI if symptoms worsen. rtc 3-4 months or sooner if new or worsening neurologic symptoms  40 minutes face-to-face evaluation with greater than one  half of the time counseling or coordinating care regarding her multiple sclerosis and symptoms.  Richard A. Epimenio Foot, MD, PhD 10/21/2015, 12:04 PM Certified in Neurology, Clinical Neurophysiology, Sleep Medicine, Pain Medicine and Neuroimaging  [facility] [address], [address] [place], [place] [address] [phone]  -

## 2015-11-10 ENCOUNTER — Ambulatory Visit
Admission: RE | Admit: 2015-11-10 | Discharge: 2015-11-10 | Disposition: A | Discharge status: Home / Self Care | Payer: BLUE CROSS/BLUE SHIELD | Source: Ambulatory Visit | Attending: Neurology | Admitting: Neurology

## 2015-11-10 DIAGNOSIS — G35 Multiple sclerosis: Secondary | ICD-10-CM

## 2015-11-10 MED ORDER — GADOBENATE DIMEGLUMINE 529 MG/ML IV SOLN
15.0000 mL | Freq: Once | INTRAVENOUS | Status: AC | PRN
  Administered 2015-11-10: 15 mL via INTRAVENOUS

## 2015-11-13 ENCOUNTER — Telehealth: Payer: Self-pay | Admitting: Neurology

## 2015-11-13 NOTE — Telephone Encounter (Signed)
The MRI of the brain showed 1 new lesion that was not there in 2015. It did not enhance. The MRI of the cervical spine shows 3 lesions, none enhancing. We do not have a comparison for the cervical spine. I discussed this finding with Irving Burton. She will be coming in to see me in the next week or 2. We will discuss options further at that time.

## 2015-11-18 ENCOUNTER — Encounter: Payer: Self-pay | Admitting: Neurology

## 2015-11-18 ENCOUNTER — Ambulatory Visit: Payer: BLUE CROSS/BLUE SHIELD | Admitting: Neurology

## 2015-11-18 ENCOUNTER — Ambulatory Visit (INDEPENDENT_AMBULATORY_CARE_PROVIDER_SITE_OTHER): Payer: BLUE CROSS/BLUE SHIELD | Admitting: Neurology

## 2015-11-18 VITALS — BP 126/94 | HR 78 | Resp 16 | Ht 63.0 in | Wt 173.5 lb

## 2015-11-18 DIAGNOSIS — F329 Major depressive disorder, single episode, unspecified: Secondary | ICD-10-CM | POA: Diagnosis not present

## 2015-11-18 DIAGNOSIS — M549 Dorsalgia, unspecified: Secondary | ICD-10-CM | POA: Diagnosis not present

## 2015-11-18 DIAGNOSIS — F32A Depression, unspecified: Secondary | ICD-10-CM

## 2015-11-18 DIAGNOSIS — R5383 Other fatigue: Secondary | ICD-10-CM

## 2015-11-18 DIAGNOSIS — R350 Frequency of micturition: Secondary | ICD-10-CM | POA: Diagnosis not present

## 2015-11-18 DIAGNOSIS — Q760 Spina bifida occulta: Secondary | ICD-10-CM | POA: Diagnosis not present

## 2015-11-18 DIAGNOSIS — G8929 Other chronic pain: Secondary | ICD-10-CM | POA: Diagnosis not present

## 2015-11-18 DIAGNOSIS — M7138 Other bursal cyst, other site: Secondary | ICD-10-CM

## 2015-11-18 DIAGNOSIS — G35 Multiple sclerosis: Secondary | ICD-10-CM

## 2015-11-18 MED ORDER — FINGOLIMOD HCL 0.5 MG PO CAPS: 1.0000 | ORAL_CAPSULE | Freq: Every day | ORAL | Status: DC

## 2015-11-18 MED ORDER — AMPHETAMINE-DEXTROAMPHETAMINE 20 MG PO TABS
20.0000 mg | ORAL_TABLET | Freq: Two times a day (BID) | ORAL | Status: DC
Start: 2015-11-18 — End: 2016-02-12

## 2015-11-18 MED ORDER — DULOXETINE HCL 60 MG PO CPEP: 60.0000 mg | ORAL_CAPSULE | Freq: Every day | ORAL | Status: DC

## 2015-11-18 NOTE — Progress Notes (Addendum)
[facility]  PATIENT: Mary Beck DOB: March 26, 1976   _________________________________   HISTORICAL  CHIEF COMPLAINT:  Chief Complaint  Patient presents with  . Multiple Sclerosis    Sts. she continues to tolerate Gilenya well.  Sts. left leg gives out more, esp. when she is climing stairs, and right leg pain is increased--but she feels this is related to back issues more than MS./fim    HISTORY OF PRESENT ILLNESS:  Mary Beck is a 40 year old woman who was diagnosed with MS in 2010.   She is noting more spasticity in her gluteal muscles, left currently worse.    She also notes more hand numbness than usual.    She is having more pain in her hips and thighs.    Leg pain is a little burning.  She gets radiating pain into right leg, more than in the past.    Meloxicam caused mild stomach upset.    Gabapentin is helping some as she is now taking tid.     I personally reviewed the MRI of the brain with and without contrast performed last month and the MRI of the cervical spine. She has one new lesion in the left frontal subcortical white matter that was not present on MRI 11/07/2013. She also has several foci in the cervical spine (right posterior at C4-C5 on the lateral left at C5, lateral left at C7) but we do not have any comparison study.    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  She likes it much better than Copaxone or Betaseron that she had been on in the past.  MRI results are above. This year, she has noted that the left leg is giving out much more than it used to. This has been going on for at least several months.  Gait/strength/senation:  She notes gait is =off balanced and left leg seems weak at times.   No arm  weakness.   Her right hand numbness never fully recovered from her major exacerbation and is bothering her more.    She notes no numbness in her legs though she has pain in the upper legs.     Vision:   She denies any current visual symptoms. However, she did have pseudotumor cerebri about 15 or 20 years ago and had serial LP's and Diamox.    Later on, she lost 70 pounds and her symptoms got better allowing her to stop medications.   She occasionally still gets headaches but no new visual changes.     Bladder/bowel:  The bladder urgency is better on oxybutynin.     She has rare incontinence, none in past couple months.   She has rare bowel incontinence (5-6 times over 6 years)  Fatigue/sleep:   She reports some fatigue, worse with the heat.    She has some sleep onset insomnia, helped by Ambien.  She tolerates it well and had no hangover effect.   Adderall 25 mg daily has helped her fatigue during the day  Mood/cognition:  She has mild depression but just got laid off this week and is concerned she will have more depression. She denies anxiety. She is currently on Wellbutrin with mild benefit.   She tolerates Wellbutrin well.  She notes mild cognitive issues verbal fluency and multitasking issues. Adderall has helped.   .  MS HIstory:   She was diagnosed in 2010 after presenting with numbness in the feet up to her knees. She had an MRI of the brain that was abnormal. This was followed by a lumbar puncture in the CSF was also abnormal. She was diagnosed with MS and was placed on Copaxone. She had an exacerbation and was switched to Betaseron. Due to needle fatigue she was switched to Gilenya about 2 years ago. Since being on Gilenya her MS has been stable. She denies any exacerbations. She tolerates Gilenya well. She had an MRI of the brain early 2015 that was unchanged compared to her previous MRI.   MRI May 2017 shows a new large chronic focus in the left frontal lobe not present in 2015. MRI of the  cervical spine shows 3 T2 hyperintense foci  MRI Brain 11/10/15 IMPRESSION: This MRI of the brain with and without contrast shows the following: 1. Scattered T2/FLAIR hyperintense foci in the hemispheres and right cerebellum in a pattern and configuration consistent with chronic demyelinating plaque associated with multiple sclerosis. None of the foci appears to be acute. However, when compared to the MRI dated 11/07/2013, one left frontal subcortical focus has occurred during the interim. 2, There are no acute findings.  MRi Cervical 11/10/15 IMPRESSION: This MRI of the cervical spine with and without contrast shows the following: 1. There are three T2 hyperintense foci within the spinal cord as detailed above consistent with chronic demyelinating plaque associated with multiple sclerosis. None of the foci enhances after contrast administration. 2. Mild degenerative changes at C5-C6 and C6-C7 that did not lead to any nerve root impingement. 3. There is a normal enhancement pattern.  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 2. Posterior element fusion anomaly/spina bifida at L3 and below. 3. Advanced facet disease in the lower lumbar spine. 4. 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     REVIEW OF SYSTEMS: Constitutional: No fevers, chills, sweats, or change in appetite.   She reports fatigue Eyes: No visual changes, double vision, .  However, mild eye pain Ear, nose and throat: No hearing loss, ear pain, nasal congestion, sore throat Cardiovascular: No chest pain, palpitations Respiratory: No shortness of breath at rest or with exertion.   No wheezes GastrointestinaI: No nausea, vomiting, diarrhea, abdominal pain, fecal incontinence Genitourinary: No dysuria or urinary retention.  She notes  frequency and nocturia. Musculoskeletal: No neck pain.  Notes back pain Integumentary: No rash, pruritus, skin lesions Neurological: as above.  Also, occasional Headache Psychiatric: Reports mild depression but little anxiety Endocrine: No palpitations, diaphoresis, change in appetite, change in weigh or increased thirst Hematologic/Lymphatic: No anemia, purpura, petechiae. Allergic/Immunologic: No itchy/runny eyes, nasal congestion, recent allergic reactions, rashes  ALLERGIES: No Known Allergies  HOME MEDICATIONS:  Current outpatient prescriptions:  .  amphetamine-dextroamphetamine (ADDERALL) 20 MG tablet, Take 1 tablet (20 mg total) by mouth 2 (two) times daily., Disp: 60 tablet, Rfl: 0 .  buPROPion (WELLBUTRIN XL) 300 MG 24 hr tablet, Take 1 tablet (300 mg total) by mouth daily., Disp: 30 tablet, Rfl: 11 .  cholecalciferol (VITAMIN D) 1000 UNITS tablet, Take 2,000 Units by mouth daily. Take 1 capsules daily, Disp: , Rfl:  .  cyclobenzaprine (  FLEXERIL) 5 MG tablet, Take 1 tablet (5 mg total) by mouth 3 (three) times daily., Disp: 90 tablet, Rfl: 3 .  Fingolimod HCl (GILENYA) 0.5 MG CAPS, Take 1 capsule (0.5 mg total) by mouth daily., Disp: 90 capsule, Rfl: 3 .  gabapentin (NEURONTIN) 300 MG capsule, Take 1 capsule (300 mg total) by mouth 4 (four) times daily., Disp: 120 capsule, Rfl: 11 .  ibuprofen (ADVIL,MOTRIN) 200 MG tablet, Take 600 mg by mouth every 6 (six) hours as needed. Patient used this medication for pain., Disp: , Rfl:  .  loratadine (CLARITIN) 10 MG tablet, Take 10 mg by mouth daily., Disp: , Rfl:  .  meloxicam (MOBIC) 15 MG tablet, Take 1 tablet (15 mg total) by mouth daily. As needed, Disp: 90 tablet, Rfl: 1 .  methylPREDNISolone (MEDROL) 4 MG tablet, Taper from 6 to one pills daily over 6 days, Disp: 21 tablet, Rfl: 0 .  oxybutynin (DITROPAN) 5 MG tablet, Take 1 tablet (5 mg total) by mouth 3 (three) times daily., Disp: 90 tablet, Rfl: 11 .  PROAIR HFA 108 (90 Base)  MCG/ACT inhaler, Inhale 2 puffs into the lungs every 6 (six) hours as needed for wheezing or shortness of breath., Disp: 6.7 g, Rfl: 1 .  zolpidem (AMBIEN) 5 MG tablet, Take 1 tablet (5 mg total) by mouth at bedtime as needed for sleep., Disp: 30 tablet, Rfl: 4  PAST MEDICAL HISTORY: Past Medical History  Diagnosis Date  . Multiple sclerosis (HCC)   . Pseudotumor cerebri   . Headache     PAST SURGICAL HISTORY: History reviewed. No pertinent past surgical history.  FAMILY HISTORY: Family History  Problem Relation Age of Onset  . Multiple sclerosis Neg Hx     SOCIAL HISTORY:  Social History   Social History  . Marital Status: Single    Spouse Name: N/A  . Number of Children: N/A  . Years of Education: N/A   Occupational History  . Not on file.   Social History Main Topics  . Smoking status: Never Smoker   . Smokeless tobacco: Never Used  . Alcohol Use: No  . Drug Use: No  . Sexual Activity: Yes    Birth Control/ Protection: None   Other Topics Concern  . Not on file   Social History Narrative     PHYSICAL EXAM  Filed Vitals:   11/18/15 1556  BP: 126/94  Pulse: 78  Resp: 16  Height: 5\' 3"  (1.6 m)  Weight: 173 lb 8 oz (78.699 kg)    Body mass index is 30.74 kg/(m^2).   General: The patient is well-developed and well-nourished and in no acute distress   Musculoskeletal:  Back is tender over the lower lumbar paraspinal muscles and the left piriformis muscles. There is no tenderness over the trochanteric bursae.  Neurologic Exam  Mental status: The patient is alert and oriented x 3 at the time of the examination. The patient has apparent normal recent and remote memory, with an apparently normal attention span and concentration ability.   Speech is normal.  Cranial nerves: Extraocular movements are full.     Facial symmetry is present. There is good facial sensation to soft touch bilaterally.Facial strength is normal.  Trapezius and sternocleidomastoid  strength is normal. No dysarthria is noted.    No obvious hearing deficits are noted.  Motor:  Muscle bulk is normal.   Tone is normal. Strength is  5 / 5 in all 4 extremities .   Sensory: Sensory testing  showed mild reduced touch and vibration sensation in the right hand  compared to the left. Legs were normal.    Coordination: Cerebellar testing reveals good finger-nose-finger and heel-to-shin bilaterally.  Gait and station: Station is normal.   Gait is normal. Tandem gait is mildly wide.    Reflexes: Deep tendon reflexes are normal and symmetric in the arms. Reflexes were increased in the knees, spread on the left.Marland Kitchen        DIAGNOSTIC DATA (LABS, IMAGING, TESTING) - I reviewed patient records, labs, notes, testing and imaging myself where available.     ASSESSMENT AND PLAN  Multiple sclerosis (HCC) - Plan: Ambulatory referral to Psychology  Occult spina bifida - Plan: Ambulatory referral to Physical Therapy  Synovial cyst of lumbar facet joint - Plan: Ambulatory referral to Physical Therapy  Back pain, chronic - Plan: Ambulatory referral to Psychology, Ambulatory referral to Physical Therapy  Other fatigue  Urinary frequency  Depression    1.   We reviewed her MRI of the brain and spinal cord and discuss options including stay on Gilenya or switching to a more efficacious disease modifying therapy (Tysabri, ocrelizumab, Lemtrada) 2.   She wishes to stay on Gilenya.   We will recheck an MRI in about a year and if that one also shows changes then I would more strongly urge her to switch to a different disease modifying therapy. 3.   Continue Adderall, refill today 4.   Continue gabapentin and prn cyclobenzaprine. 5.   Most likely, the left leg reflex asymmetry and the left leg giving out is due to her cervical spine disease while her right leg pain is due to the lumbar findings.  If pain persists, consider referral for epidural steroid injection and facet joint  injection/drainage. 6.   Refer to psychology for depression, chronic pain management.   Add Duloxetine  7.   PT    rtc 3-4 months or sooner if new or worsening neurologic symptoms  40 minutes face-to-face evaluation with greater than one half of the time counseling or coordinating care regarding her multiple sclerosis and symptoms.  Blaike Vickers A. Epimenio Foot, MD, PhD 11/18/2015, 4:05 PM Certified in Neurology, Clinical Neurophysiology, Sleep Medicine, Pain Medicine and Neuroimaging  [facility] [address] [place], [place] [address] [phone]  -

## 2016-01-19 ENCOUNTER — Other Ambulatory Visit: Payer: Self-pay | Admitting: *Deleted

## 2016-01-19 MED ORDER — ZOLPIDEM TARTRATE 5 MG PO TABS: 5.0000 mg | ORAL_TABLET | Freq: Every evening | ORAL | 4 refills | Status: DC | PRN

## 2016-02-12 ENCOUNTER — Encounter: Payer: Self-pay | Admitting: Neurology

## 2016-02-12 ENCOUNTER — Ambulatory Visit (INDEPENDENT_AMBULATORY_CARE_PROVIDER_SITE_OTHER): Payer: BLUE CROSS/BLUE SHIELD | Admitting: Neurology

## 2016-02-12 VITALS — BP 120/84 | HR 78 | Resp 14 | Ht 63.0 in | Wt 173.0 lb

## 2016-02-12 DIAGNOSIS — Q760 Spina bifida occulta: Secondary | ICD-10-CM

## 2016-02-12 DIAGNOSIS — R350 Frequency of micturition: Secondary | ICD-10-CM | POA: Diagnosis not present

## 2016-02-12 DIAGNOSIS — M5441 Lumbago with sciatica, right side: Secondary | ICD-10-CM

## 2016-02-12 DIAGNOSIS — G35 Multiple sclerosis: Secondary | ICD-10-CM

## 2016-02-12 DIAGNOSIS — M7138 Other bursal cyst, other site: Secondary | ICD-10-CM | POA: Diagnosis not present

## 2016-02-12 DIAGNOSIS — R5383 Other fatigue: Secondary | ICD-10-CM | POA: Diagnosis not present

## 2016-02-12 MED ORDER — ETODOLAC 400 MG PO TABS: 400.0000 mg | ORAL_TABLET | Freq: Two times a day (BID) | ORAL | 5 refills | Status: DC

## 2016-02-12 MED ORDER — AMPHETAMINE-DEXTROAMPHETAMINE 20 MG PO TABS: 20.0000 mg | ORAL_TABLET | Freq: Two times a day (BID) | ORAL | 0 refills | Status: DC

## 2016-02-12 NOTE — Progress Notes (Signed)
[facility]  PATIENT: Madelynne Lasker Vandenbrink DOB: 1975-07-08   _________________________________   HISTORICAL  CHIEF COMPLAINT:  Chief Complaint  Patient presents with  . Multiple Sclerosis    Sts. she continues to tolerate Gilenya well.  Sts lower back pain has been more constant--radiating down back of right leg to mid-thigh.   Still has some left leg weakness--leg will give out somestimes, usually if she is squatting/fim  . Back Pain    HISTORY OF PRESENT ILLNESS:  Amparo Donalson is a 40 year old woman who was diagnosed with MS in 2010.   She is noting more lower back discomfort.    She has a synovial cyst at L5S1.   Pain radiates down the right leg.    She denies weakness on that side.  Has mild weakness on the left leg (from MS).     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   Pain is better with NSAIDs and gabapentin but still present daily.  Meloxicam caused some GI upset but Ibuprofen does not cause issues (but she takes just one time a day)  She usually just takes gabapentin 1-2 times a day and we discussed taking 3-4 a day.   MS:   She is on Gilenya and tolerates it well.  She likes it much better than Copaxone or Betaseron that she had been on in the past.  MRI results are above. This year, she has noted that the left leg is giving out much more than it used to. This has been going on for at least several months.  Gait/strength/senation:  She notes gait is mildly off balanced and left leg seems weak at times.   No arm weakness.   Her right hand has been numb since her major exacerbation and is bothering her more.    She notes no numbness in her legs though she has pain in the legs.     Vision:   She denies any current visual symptoms. However, she did have pseudotumor cerebri  about 15 or 20 years ago and had serial LP's and Diamox.  When she lost 70 pounds her symptoms got better.  Bladder/bowel:  The bladder urgency is better on oxybutynin, especially after switching to night time dosing.     She has no recent incontinence.   She has rare bowel incontinence (5-6 times over 6 years)  Fatigue/sleep:   She reports some fatigue, worse with the heat.    She has some sleep onset insomnia, helped by Ambien.  She tolerates it well and had no hangover effect.   Adderall 20 mg once or twice a day has helped her fatigue during the day  Mood/cognition:  She has less depression.   She feels better since getting a new job.  She denies anxiety. She is currently on Wellbutrin with mild benefit.   She tolerates it well.   She notes mild cognitive issues verbal fluency and multitasking issues. Adderall has helped her mild focus/attnetion issues  MS HIstory:   She was diagnosed in 2010 after presenting with numbness in the feet up to her knees. She had an MRI of the brain that was abnormal. This was followed by a lumbar puncture in the CSF was also abnormal. She was diagnosed with MS and was placed on Copaxone. She had  an exacerbation and was switched to Betaseron. Due to needle fatigue she was switched to Gilenya about 2 years ago. Since being on Gilenya her MS has been stable. She denies any exacerbations. She tolerates Gilenya well. She had an MRI of the brain early 2015 that was unchanged compared to her previous MRI.   MRI May 2017 shows a new large chronic focus in the left frontal lobe not present in 2015. MRI of the cervical spine shows 3 T2 hyperintense foci  MRI Brain 11/10/15 IMPRESSION: This MRI of the brain with and without contrast shows the following: 1. Scattered T2/FLAIR hyperintense foci in the hemispheres and right cerebellum in a pattern and configuration consistent with chronic demyelinating plaque associated with multiple sclerosis. None of the foci appears to be  acute. However, when compared to the MRI dated 11/07/2013, one left frontal subcortical focus has occurred during the interim. 2, There are no acute findings.  MRi Cervical 11/10/15 IMPRESSION: This MRI of the cervical spine with and without contrast shows the following: 1. There are three T2 hyperintense foci within the spinal cord as detailed above consistent with chronic demyelinating plaque associated with multiple sclerosis. None of the foci enhances after contrast administration. 2. Mild degenerative changes at C5-C6 and C6-C7 that did not lead to any nerve root impingement. 3. There is a normal enhancement pattern.  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 2. Posterior element fusion anomaly/spina bifida at L3 and below. 3. Advanced facet disease in the lower lumbar spine. 4. 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     REVIEW OF SYSTEMS: Constitutional: No fevers, chills, sweats, or change in appetite.   She reports fatigue Eyes: No visual changes, double vision, .  However, mild eye pain Ear, nose and throat: No hearing loss, ear pain, nasal congestion, sore throat Cardiovascular: No chest pain, palpitations Respiratory: No shortness of breath at rest or with exertion.   No wheezes GastrointestinaI: No nausea, vomiting, diarrhea, abdominal pain, fecal incontinence Genitourinary: No dysuria or urinary retention.  She notes frequency and nocturia. Musculoskeletal: No neck pain.  Notes back pain Integumentary: No rash, pruritus, skin lesions Neurological: as above.  Also, occasional Headache Psychiatric: Reports mild depression but little anxiety Endocrine: No palpitations, diaphoresis, change in appetite, change in weigh or increased thirst Hematologic/Lymphatic: No anemia, purpura,  petechiae. Allergic/Immunologic: No itchy/runny eyes, nasal congestion, recent allergic reactions, rashes  ALLERGIES: No Known Allergies  HOME MEDICATIONS:  Current Outpatient Prescriptions:  .  amphetamine-dextroamphetamine (ADDERALL) 20 MG tablet, Take 1 tablet (20 mg total) by mouth 2 (two) times daily., Disp: 60 tablet, Rfl: 0 .  buPROPion (WELLBUTRIN XL) 300 MG 24 hr tablet, Take 1 tablet (300 mg total) by mouth daily., Disp: 30 tablet, Rfl: 11 .  cholecalciferol (VITAMIN D) 1000 UNITS tablet, Take 2,000 Units by mouth daily. Take 1 capsules daily, Disp: , Rfl:  .  cyclobenzaprine (FLEXERIL) 5 MG tablet, Take 1 tablet (5 mg total) by mouth 3 (three) times daily., Disp: 90 tablet, Rfl: 3 .  DULoxetine (CYMBALTA) 60 MG capsule, Take 1 capsule (60 mg total) by mouth daily., Disp: 60 capsule, Rfl: 11 .  Fingolimod HCl (GILENYA) 0.5 MG CAPS, Take 1 capsule (0.5 mg total) by mouth daily., Disp: 90 capsule, Rfl: 3 .  gabapentin (NEURONTIN) 300 MG capsule, Take  1 capsule (300 mg total) by mouth 4 (four) times daily., Disp: 120 capsule, Rfl: 11 .  ibuprofen (ADVIL,MOTRIN) 200 MG tablet, Take 600 mg by mouth every 6 (six) hours as needed. Patient used this medication for pain., Disp: , Rfl:  .  loratadine (CLARITIN) 10 MG tablet, Take 10 mg by mouth daily., Disp: , Rfl:  .  oxybutynin (DITROPAN) 5 MG tablet, Take 1 tablet (5 mg total) by mouth 3 (three) times daily., Disp: 90 tablet, Rfl: 11 .  PROAIR HFA 108 (90 Base) MCG/ACT inhaler, Inhale 2 puffs into the lungs every 6 (six) hours as needed for wheezing or shortness of breath., Disp: 6.7 g, Rfl: 1 .  zolpidem (AMBIEN) 5 MG tablet, Take 1 tablet (5 mg total) by mouth at bedtime as needed for sleep., Disp: 30 tablet, Rfl: 4 .  etodolac (LODINE) 400 MG tablet, Take 1 tablet (400 mg total) by mouth 2 (two) times daily., Disp: 60 tablet, Rfl: 5 .  meloxicam (MOBIC) 15 MG tablet, Take 1 tablet (15 mg total) by mouth daily. As needed (Patient not  taking: Reported on 02/12/2016), Disp: 90 tablet, Rfl: 1 .  methylPREDNISolone (MEDROL) 4 MG tablet, Taper from 6 to one pills daily over 6 days (Patient not taking: Reported on 02/12/2016), Disp: 21 tablet, Rfl: 0  PAST MEDICAL HISTORY: Past Medical History:  Diagnosis Date  . Headache   . Multiple sclerosis (HCC)   . Pseudotumor cerebri     PAST SURGICAL HISTORY: No past surgical history on file.  FAMILY HISTORY: Family History  Problem Relation Age of Onset  . Multiple sclerosis Neg Hx     SOCIAL HISTORY:  Social History   Social History  . Marital status: Single    Spouse name: N/A  . Number of children: N/A  . Years of education: N/A   Occupational History  . Not on file.   Social History Main Topics  . Smoking status: Never Smoker  . Smokeless tobacco: Never Used  . Alcohol use No  . Drug use: No  . Sexual activity: Yes    Birth control/ protection: None   Other Topics Concern  . Not on file   Social History Narrative  . No narrative on file     PHYSICAL EXAM  Vitals:   02/12/16 1627  BP: 120/84  Pulse: 78  Resp: 14  Weight: 173 lb (78.5 kg)  Height: 5\' 3"  (1.6 m)    Body mass index is 30.65 kg/m.   General: The patient is well-developed and well-nourished and in no acute distress   Musculoskeletal:  Back is tender over the lower lumbar paraspinal muscles and the left piriformis muscles. There is no tenderness over the trochanteric bursae.  Neurologic Exam  Mental status: The patient is alert and oriented x 3 at the time of the examination. The patient has apparent normal recent and remote memory, with an apparently normal attention span and concentration ability.   Speech is normal.  Cranial nerves: Extraocular movements are full.     Facial symmetry is present. There is good facial sensation to soft touch bilaterally.Facial strength is normal.  Trapezius and sternocleidomastoid strength is normal. No dysarthria is noted.    No obvious  hearing deficits are noted.  Motor:  Muscle bulk is normal.   Tone is normal. Strength is  5 / 5 in all 4 extremities .   Sensory: Sensory testing showed mild reduced touch and vibration sensation in the right hand compared to the  left. Legs were normal.    Coordination: Cerebellar testing reveals good finger-nose-finger and heel-to-shin bilaterally.  Gait and station: Station is normal.   Gait is normal. Tandem gait is mildly wide.    Reflexes: Deep tendon reflexes are normal and symmetric in the arms. Reflexes were increased in the knees, spread on the left..    Right ankle DTR reduced relative to left.    DIAGNOSTIC DATA (LABS, IMAGING, TESTING) - I reviewed patient records, labs, notes, testing and imaging myself where available.     ASSESSMENT AND PLAN  MS (multiple sclerosis) (HCC)  Right-sided low back pain with right-sided sciatica - Plan: Ambulatory referral to Neurosurgery  Synovial cyst of lumbar facet joint - Plan: Ambulatory referral to Neurosurgery  Urinary frequency  Occult spina bifida - Plan: Ambulatory referral to Neurosurgery  Other fatigue   1.   For her MS, she will stay on Gilenya. Labwork was fine earlier this year. Discussed that I would like to recheck an MRI sometime next year as the last MRI showed 1 new focus not present 2 years previously. MRI also shows changes, I would urge her to switch to one of the IV medications as it may be more efficacious for MS.  2.    She has a synovial cyst compressing the right S1 nerve root. She has not responded much to oral medications and I will have her evaluated by neurosurgery.    She also has diastematomyelia and tethered cord.  3.    Continue gabapentin and ad etodolac. 4.   Renew Adderall  5.   RTC 4-5 months or sooner if new or worsening neurologic problems.   Doroteo Nickolson A. Epimenio FootSater, MD, PhD 02/12/2016, 5:04 PM Certified in Neurology, Clinical Neurophysiology, Sleep Medicine, Pain Medicine and  Neuroimaging  [facility] [address], [address] [place], [place] [id number] [phone]  -

## 2016-02-26 ENCOUNTER — Telehealth: Payer: Self-pay | Admitting: Neurology

## 2016-02-26 NOTE — Telephone Encounter (Signed)
Dr. Willaim Sheng [phone] or [phone] called requesting to speak with Dr. Epimenio Foot regarding patient, "patient has tethered cord and synovial cyst, complex case and is very important that he speaks with Dr. Epimenio Foot", advised Dr. Epimenio Foot out of the office until Monday, offered to speak with on call Doctor (work in Dr. Jaye Beagle of the office, per nurse Marcelino Duster, Dr. Lucia Gaskins for this call) Dr. Jonathon Jordan okay with leaving message with Dr. Epimenio Foot but doesn't want this to fall through the cracks. Skyped Dr. Lucia Gaskins, information given.

## 2016-02-26 NOTE — Telephone Encounter (Signed)
Spoke to Dr. Nell Range and she is having problems with urination and defecation and the synovial cyst may not be the issue it is likely the tethered cord and surgeon is quite concerned about this. He is referring her to urology for bladder studies and he is suggesting a conduction study of the lower extremities. Afterwards there should be a conversation of untethering the cord in addition to the synovial cyst. Please call him [phone] to discuss further. thanks

## 2016-03-02 DIAGNOSIS — F331 Major depressive disorder, recurrent, moderate: Secondary | ICD-10-CM | POA: Insufficient documentation

## 2016-03-04 DIAGNOSIS — N3942 Incontinence without sensory awareness: Secondary | ICD-10-CM | POA: Insufficient documentation

## 2016-03-04 DIAGNOSIS — N319 Neuromuscular dysfunction of bladder, unspecified: Secondary | ICD-10-CM | POA: Insufficient documentation

## 2016-03-05 NOTE — Telephone Encounter (Signed)
Dr Daiva Eves [facility] Neurosurgery) had wanted a NCV/EMG study of the legs to be performed.   Please let her know and if he has not scheduled it, we can schedule it here.

## 2016-03-08 NOTE — Telephone Encounter (Signed)
I have spoken with Mary Beck this afternoon.  She has not been sched. for an EMG.  Will get this sched for her/fim

## 2016-03-08 NOTE — Telephone Encounter (Signed)
Pt returned call. Please call and advise °

## 2016-03-08 NOTE — Telephone Encounter (Signed)
LMTC./fim 

## 2016-03-09 ENCOUNTER — Other Ambulatory Visit: Payer: Self-pay | Admitting: *Deleted

## 2016-03-09 DIAGNOSIS — Q068 Other specified congenital malformations of spinal cord: Secondary | ICD-10-CM

## 2016-03-09 DIAGNOSIS — M545 Low back pain: Secondary | ICD-10-CM

## 2016-03-09 NOTE — Telephone Encounter (Signed)
EMG/NCV bilat legs ordered/fim

## 2016-03-11 DIAGNOSIS — H35413 Lattice degeneration of retina, bilateral: Secondary | ICD-10-CM | POA: Insufficient documentation

## 2016-03-11 DIAGNOSIS — Z79899 Other long term (current) drug therapy: Secondary | ICD-10-CM | POA: Insufficient documentation

## 2016-03-17 ENCOUNTER — Ambulatory Visit (INDEPENDENT_AMBULATORY_CARE_PROVIDER_SITE_OTHER): Payer: BLUE CROSS/BLUE SHIELD | Admitting: Neurology

## 2016-03-17 ENCOUNTER — Ambulatory Visit (INDEPENDENT_AMBULATORY_CARE_PROVIDER_SITE_OTHER): Payer: Self-pay | Admitting: Neurology

## 2016-03-17 DIAGNOSIS — M5441 Lumbago with sciatica, right side: Secondary | ICD-10-CM | POA: Diagnosis not present

## 2016-03-17 DIAGNOSIS — Q760 Spina bifida occulta: Secondary | ICD-10-CM

## 2016-03-17 DIAGNOSIS — M545 Low back pain: Secondary | ICD-10-CM

## 2016-03-17 DIAGNOSIS — Q068 Other specified congenital malformations of spinal cord: Secondary | ICD-10-CM

## 2016-03-17 DIAGNOSIS — Z0289 Encounter for other administrative examinations: Secondary | ICD-10-CM

## 2016-03-17 NOTE — Progress Notes (Signed)
   STUDY DATE: 03/17/16 PATIENT NAME: Mary Beck DOB: 12/28/1975 MRN: [id number]  HISTORY:  Jaislyn Laurent is a 40 year old woman with multiple sclerosis with right S1 radiculopathy and mild left leg weakness who was found on MRI of the lumbar spine to have diastematomyelia, a tethered cord, lower lumbar spina bifida and a large right synovial cyst at L5-S1 displacing the right S1 nerve root.  NERVE CONDUCTION STUDIES:  The right peroneal motor response had a normal distal latency, amplitude and conduction velocity. The right posterior tibial motor response had a normal distal latency, amplitude and conduction velocity. The right superficial peroneal sensory response had a normal distal latency and amplitude.  The left peroneal motor response had a normal distal latency, amplitude and conduction velocity. The left posterior tibial motor response had a normal distal latency, amplitude and conduction velocity. The left superficial peroneal sensory response had a normal distal latency and amplitude.  Bilateral peroneal and posterior tibial F-wave responses were normal and symmetric. The H reflexes were normal and symmetric.  EMG STUDIES:  Needle EMG of select muscles of both legs was performed.  In the right leg, the iliopsoas, vastus medialis, and anterior tibialis muscles at no spontaneous activity and normal motor unit morphology and recruitment. The gluteus medius muscle had some polyphasic motor units but recruitment was normal. The peroneus longus and gastrocnemius muscles on tennis activity, increased number of polyphasic motor units and mildly neuropathic recruitment.  In the left leg, the iliopsoas, vastus medialis, anterior tibialis, peroneus longus, and gastrocnemius muscles had no spontaneous activity and normal motor unit morphology and recruitment.  IMPRESSION:  This NCV/EMG study shows the following: 1.    Mild chronic right S1 without active features. 2.    No evidence  of polyneuropathy or mononeuropathy.   Nirvaan Frett A. Epimenio Foot, MD, PhD Certified in Neurology, Clinical Neurophysiology, Sleep Medicine, Pain Medicine and Neuroimaging  [facility] [address], [address] [place], [place] [address] [phone]

## 2016-03-18 NOTE — Progress Notes (Signed)
NOTES FROM TODAY'S OV HAVE BEEN FAXED TO DR. Jonathon JordanZUPRUK FAX# [phone], WITH FAX CONFIRMATION RECEIVED/fim

## 2016-03-29 ENCOUNTER — Other Ambulatory Visit: Payer: Self-pay | Admitting: Neurology

## 2016-03-29 MED ORDER — AMPHETAMINE-DEXTROAMPHETAMINE 20 MG PO TABS: 20.0000 mg | ORAL_TABLET | Freq: Two times a day (BID) | ORAL | 0 refills | Status: DC

## 2016-03-29 NOTE — Telephone Encounter (Signed)
Script waiting for Dr Bonnita Hollow sig.

## 2016-03-29 NOTE — Telephone Encounter (Signed)
Faxed and confirmed to pharmacy.

## 2016-03-29 NOTE — Telephone Encounter (Signed)
Pt request for amphetamine-dextroamphetamine (ADDERALL) 20 MG tablet

## 2016-05-06 ENCOUNTER — Telehealth: Payer: Self-pay | Admitting: Neurology

## 2016-05-06 MED ORDER — AMPHETAMINE-DEXTROAMPHETAMINE 20 MG PO TABS: 20.0000 mg | ORAL_TABLET | Freq: Two times a day (BID) | ORAL | 0 refills | Status: DC

## 2016-05-06 NOTE — Telephone Encounter (Signed)
Patient called to request refill of amphetamine-dextroamphetamine (ADDERALL) 20 MG tablet, requests pick up of this Rx today as she will be in town.

## 2016-05-06 NOTE — Telephone Encounter (Signed)
Rx. awaiting RAS sig.  Pt. aware she may pick rx. up this afternoon/fim

## 2016-05-18 ENCOUNTER — Emergency Department (HOSPITAL_BASED_OUTPATIENT_CLINIC_OR_DEPARTMENT_OTHER)
Admission: EM | Admit: 2016-05-18 | Discharge: 2016-05-18 | Disposition: A | Discharge status: Home / Self Care | Payer: BLUE CROSS/BLUE SHIELD | Attending: Emergency Medicine | Admitting: Emergency Medicine

## 2016-05-18 ENCOUNTER — Encounter (HOSPITAL_BASED_OUTPATIENT_CLINIC_OR_DEPARTMENT_OTHER): Payer: Self-pay

## 2016-05-18 ENCOUNTER — Emergency Department (HOSPITAL_BASED_OUTPATIENT_CLINIC_OR_DEPARTMENT_OTHER): Payer: BLUE CROSS/BLUE SHIELD

## 2016-05-18 DIAGNOSIS — N200 Calculus of kidney: Secondary | ICD-10-CM | POA: Diagnosis not present

## 2016-05-18 DIAGNOSIS — R109 Unspecified abdominal pain: Secondary | ICD-10-CM | POA: Diagnosis present

## 2016-05-18 DIAGNOSIS — N3001 Acute cystitis with hematuria: Secondary | ICD-10-CM

## 2016-05-18 HISTORY — DX: Spina bifida, unspecified: Q05.9

## 2016-05-18 LAB — URINALYSIS, ROUTINE W REFLEX MICROSCOPIC
Glucose, UA: NEGATIVE mg/dL
Ketones, ur: 15 mg/dL — AB
Nitrite: NEGATIVE
PH: 8 (ref 5.0–8.0)
Protein, ur: NEGATIVE mg/dL
SPECIFIC GRAVITY, URINE: 1.016 (ref 1.005–1.030)

## 2016-05-18 LAB — CBC WITH DIFFERENTIAL/PLATELET
Basophils Absolute: 0 10*3/uL (ref 0.0–0.1)
Basophils Relative: 0 %
EOS PCT: 0 %
Eosinophils Absolute: 0 10*3/uL (ref 0.0–0.7)
HCT: 39.8 % (ref 36.0–46.0)
Hemoglobin: 13.7 g/dL (ref 12.0–15.0)
LYMPHS ABS: 0.3 10*3/uL — AB (ref 0.7–4.0)
LYMPHS PCT: 3 %
MCH: 30.9 pg (ref 26.0–34.0)
MCHC: 34.4 g/dL (ref 30.0–36.0)
MCV: 89.8 fL (ref 78.0–100.0)
MONO ABS: 1 10*3/uL (ref 0.1–1.0)
Monocytes Relative: 10 %
Neutro Abs: 9.4 10*3/uL — ABNORMAL HIGH (ref 1.7–7.7)
Neutrophils Relative %: 87 %
PLATELETS: 288 10*3/uL (ref 150–400)
RBC: 4.43 MIL/uL (ref 3.87–5.11)
RDW: 12.7 % (ref 11.5–15.5)
WBC: 10.7 10*3/uL — ABNORMAL HIGH (ref 4.0–10.5)

## 2016-05-18 LAB — BASIC METABOLIC PANEL
Anion gap: 9 (ref 5–15)
BUN: 21 mg/dL — AB (ref 6–20)
CALCIUM: 9.3 mg/dL (ref 8.9–10.3)
CO2: 26 mmol/L (ref 22–32)
Chloride: 103 mmol/L (ref 101–111)
Creatinine, Ser: 1.08 mg/dL — ABNORMAL HIGH (ref 0.44–1.00)
GFR calc Af Amer: >60 mL/min (ref >60)
GLUCOSE: 109 mg/dL — AB (ref 65–99)
Potassium: 3.3 mmol/L — ABNORMAL LOW (ref 3.5–5.1)
Sodium: 138 mmol/L (ref 135–145)

## 2016-05-18 LAB — URINE MICROSCOPIC-ADD ON

## 2016-05-18 LAB — PREGNANCY, URINE: Preg Test, Ur: NEGATIVE

## 2016-05-18 MED ORDER — OXYCODONE-ACETAMINOPHEN 5-325 MG PO TABS: 1.0000 | ORAL_TABLET | Freq: Four times a day (QID) | ORAL | 0 refills | Status: DC | PRN

## 2016-05-18 MED ORDER — CEPHALEXIN 500 MG PO CAPS: 500.0000 mg | ORAL_CAPSULE | Freq: Two times a day (BID) | ORAL | 0 refills | Status: DC

## 2016-05-18 MED ORDER — DEXTROSE 5 % IV SOLN
1.0000 g | Freq: Once | INTRAVENOUS | Status: AC
  Administered 2016-05-18: 1 g via INTRAVENOUS
  Filled 2016-05-18: qty 10

## 2016-05-18 MED ORDER — MORPHINE SULFATE (PF) 4 MG/ML IV SOLN
4.0000 mg | Freq: Once | INTRAVENOUS | Status: AC
  Administered 2016-05-18: 4 mg via INTRAVENOUS
  Filled 2016-05-18: qty 1

## 2016-05-18 MED ORDER — SODIUM CHLORIDE 0.9 % IV BOLUS (SEPSIS)
1000.0000 mL | Freq: Once | INTRAVENOUS | Status: AC
  Administered 2016-05-18: 1000 mL via INTRAVENOUS

## 2016-05-18 MED ORDER — ONDANSETRON 4 MG PO TBDP: 4.0000 mg | ORAL_TABLET | Freq: Four times a day (QID) | ORAL | 0 refills | Status: DC | PRN

## 2016-05-18 MED ORDER — KETOROLAC TROMETHAMINE 30 MG/ML IJ SOLN
30.0000 mg | Freq: Once | INTRAMUSCULAR | Status: AC
  Administered 2016-05-18: 30 mg via INTRAVENOUS
  Filled 2016-05-18: qty 1

## 2016-05-18 MED ORDER — IBUPROFEN 800 MG PO TABS: 800.0000 mg | ORAL_TABLET | Freq: Three times a day (TID) | ORAL | 0 refills | Status: DC | PRN

## 2016-05-18 MED ORDER — TAMSULOSIN HCL 0.4 MG PO CAPS: 0.4000 mg | ORAL_CAPSULE | Freq: Every day | ORAL | 0 refills | Status: DC

## 2016-05-18 MED ORDER — ONDANSETRON HCL 4 MG/2ML IJ SOLN
4.0000 mg | Freq: Once | INTRAMUSCULAR | Status: AC
  Administered 2016-05-18: 4 mg via INTRAVENOUS
  Filled 2016-05-18: qty 2

## 2016-05-18 NOTE — ED Triage Notes (Signed)
Pt c/o lt flank pain with nausea since 2230 last pm, difficulty urinating

## 2016-05-18 NOTE — ED Provider Notes (Signed)
TIME SEEN: 4:50 AM  CHIEF COMPLAINT: Left-sided flank pain, nausea  HPI: Pt is a 40 y.o. female with history of pseudotumor cerebri, multiple sclerosis who presents emergency department with intermittent left flank pain for the past 5 days. Had an episode 5 days ago with nausea that resolved spontaneously. Pain returned suddenly tonight and is in the left flank radiating into the left lower back. No aggravating or relieving factors. No injury to the back. No numbness, tingling or focal weakness. Having difficulty urinating but is able to urinate. No bowel or bladder incontinence. No fevers but has had chills. Has had nausea but no vomiting. No diarrhea. Has never had a kidney stone or kidney infection before. No family history of kidney stones.  ROS: See HPI Constitutional: no fever  Eyes: no drainage  ENT: no runny nose   Cardiovascular:  no chest pain  Resp: no SOB  GI: no vomiting GU: no dysuria Integumentary: no rash  Allergy: no hives  Musculoskeletal: no leg swelling  Neurological: no slurred speech ROS otherwise negative  PAST MEDICAL HISTORY/PAST SURGICAL HISTORY:  Past Medical History:  Diagnosis Date  . Headache   . Multiple sclerosis (HCC)   . Pseudotumor cerebri     MEDICATIONS:  Prior to Admission medications   Medication Sig Start Date End Date Taking? Authorizing Provider  amphetamine-dextroamphetamine (ADDERALL) 20 MG tablet Take 1 tablet (20 mg total) by mouth 2 (two) times daily. 05/06/16   Asa Lenteichard A Sater, MD  buPROPion (WELLBUTRIN XL) 300 MG 24 hr tablet Take 1 tablet (300 mg total) by mouth daily. 06/19/15   Asa Lenteichard A Sater, MD  cholecalciferol (VITAMIN D) 1000 UNITS tablet Take 2,000 Units by mouth daily. Take 1 capsules daily    Historical Provider, MD  cyclobenzaprine (FLEXERIL) 5 MG tablet Take 1 tablet (5 mg total) by mouth 3 (three) times daily. 10/21/15   Asa Lenteichard A Sater, MD  DULoxetine (CYMBALTA) 60 MG capsule Take 1 capsule (60 mg total) by mouth daily.  11/18/15   Asa Lenteichard A Sater, MD  etodolac (LODINE) 400 MG tablet Take 1 tablet (400 mg total) by mouth 2 (two) times daily. 02/12/16   Asa Lenteichard A Sater, MD  Fingolimod HCl (GILENYA) 0.5 MG CAPS Take 1 capsule (0.5 mg total) by mouth daily. 11/18/15   Asa Lenteichard A Sater, MD  gabapentin (NEURONTIN) 300 MG capsule Take 1 capsule (300 mg total) by mouth 4 (four) times daily. 10/21/15   Asa Lenteichard A Sater, MD  ibuprofen (ADVIL,MOTRIN) 200 MG tablet Take 600 mg by mouth every 6 (six) hours as needed. Patient used this medication for pain.    Historical Provider, MD  loratadine (CLARITIN) 10 MG tablet Take 10 mg by mouth daily.    Historical Provider, MD  oxybutynin (DITROPAN) 5 MG tablet Take 1 tablet (5 mg total) by mouth 3 (three) times daily. 06/19/15   Asa Lenteichard A Sater, MD  PROAIR HFA [phone](90 Base) MCG/ACT inhaler Inhale 2 puffs into the lungs every 6 (six) hours as needed for wheezing or shortness of breath. 06/19/15   Asa Lenteichard A Sater, MD  zolpidem (AMBIEN) 5 MG tablet Take 1 tablet (5 mg total) by mouth at bedtime as needed for sleep. 01/19/16   Asa Lenteichard A Sater, MD    ALLERGIES:  No Known Allergies  SOCIAL HISTORY:  Social History  Substance Use Topics  . Smoking status: Never Smoker  . Smokeless tobacco: Never Used  . Alcohol use No    FAMILY HISTORY: Family History  Problem Relation Age  of Onset  . Multiple sclerosis Neg Hx     EXAM: BP 112/63 (BP Location: Right Arm)   Pulse 77   Temp 97.9 F (36.6 C) (Oral)   Resp 18   Ht 5\' 3"  (1.6 m)   Wt 173 lb (78.5 kg)   LMP 05/11/2016   SpO2 100%   BMI 30.65 kg/m  CONSTITUTIONAL: Alert and oriented and responds appropriately to questions. Appears uncomfortable, afebrile and nontoxic HEAD: Normocephalic EYES: Conjunctivae clear, PERRL, EOMI ENT: normal nose; no rhinorrhea; moist mucous membranes NECK: Supple, no meningismus, no nuchal rigidity, no LAD  CARD: RRR; S1 and S2 appreciated; no murmurs, no clicks, no rubs, no gallops RESP: Normal  chest excursion without splinting or tachypnea; breath sounds clear and equal bilaterally; no wheezes, no rhonchi, no rales, no hypoxia or respiratory distress, speaking full sentences ABD/GI: Normal bowel sounds; non-distended; soft, non-tender, no rebound, no guarding, no peritoneal signs, no hepatosplenomegaly BACK:  The back appears normal and is non-tender to palpation, patient has left-sided CVA tenderness, no midline spinal tenderness or step-off or deformity, no lesions or rash noted to the back, no erythema or warmth EXT: Normal ROM in all joints; non-tender to palpation; no edema; normal capillary refill; no cyanosis, no calf tenderness or swelling    SKIN: Normal color for age and race; warm; no rash NEURO: Moves all extremities equally, sensation to light touch intact diffusely, cranial nerves II through XII intact, normal speech, normal gait PSYCH: The patient's mood and manner are appropriate. Grooming and personal hygiene are appropriate.  MEDICAL DECISION MAKING: Patient here with left-sided flank pain. Concern for kidney infection versus kidney stone. Urine shows no gross hematuria. Will obtain labs, urinalysis and a CT of her abdomen and pelvis. Abdominal exam is benign. We'll give IV fluids, Toradol, Zofran, morphine for symptom control. No injury to the back. No new neurologic deficits. No fever.  ED PROGRESS: 5:35 AM  Patient's labs show mild leukocytosis of 10.7. May be reactive versus related to possible UTI. Urine shows blood, leukocytes and many bacteria. Culture is pending. CT scan shows a 5 mm stone in the distal left ureter with moderate proximal obstruction. We'll give her a dose of IV ceftriaxone.  Patient reports feeling much better. We'll discharge with prescriptions for Percocet, ibuprofen, Zofran, Flomax. We'll discharge with urine strainer. Will discharge on Keflex. States she has a urologist in [facility] and has an appointment or to scheduled on December 15. She will  call in the morning to try to move this up. Discussed with her return precautions.  She verbalizes understanding is comfortable with this plan.   At this time, I do not feel there is any life-threatening condition present. I have reviewed and discussed all results (EKG, imaging, lab, urine as appropriate) and exam findings with patient/family. I have reviewed nursing notes and appropriate previous records.  I feel the patient is safe to be discharged home without further emergent workup and can continue workup as an outpatient as needed. Discussed usual and customary return precautions. Patient/family verbalize understanding and are comfortable with this plan.  Outpatient follow-up has been provided. All questions have been answered.      Layla Maw Alizah Sills, DO 05/18/16 [phone]

## 2016-05-19 LAB — URINE CULTURE

## 2016-06-03 ENCOUNTER — Telehealth: Payer: Self-pay | Admitting: Neurology

## 2016-06-03 ENCOUNTER — Other Ambulatory Visit: Payer: Self-pay | Admitting: *Deleted

## 2016-06-03 MED ORDER — AMPHETAMINE-DEXTROAMPHETAMINE 20 MG PO TABS: 20.0000 mg | ORAL_TABLET | Freq: Two times a day (BID) | ORAL | 0 refills | Status: DC

## 2016-06-03 NOTE — Telephone Encounter (Signed)
Pt request refill for amphetamine-dextroamphetamine (ADDERALL) 20 MG tablet. Pt is aware the clinic closes at noon tomorrow °

## 2016-06-03 NOTE — Telephone Encounter (Signed)
Rx postdated, printed, signed and placed up front for pick up.  

## 2016-07-07 ENCOUNTER — Other Ambulatory Visit: Payer: Self-pay | Admitting: Neurology

## 2016-07-13 ENCOUNTER — Other Ambulatory Visit: Payer: Self-pay | Admitting: Neurology

## 2016-07-13 ENCOUNTER — Encounter: Payer: Self-pay | Admitting: Neurology

## 2016-07-14 ENCOUNTER — Telehealth: Payer: Self-pay | Admitting: *Deleted

## 2016-07-14 ENCOUNTER — Other Ambulatory Visit: Payer: Self-pay | Admitting: *Deleted

## 2016-07-14 MED ORDER — AMPHETAMINE-DEXTROAMPHETAMINE 20 MG PO TABS: 20.0000 mg | ORAL_TABLET | Freq: Two times a day (BID) | ORAL | 0 refills | Status: DC

## 2016-07-14 NOTE — Telephone Encounter (Signed)
Physician portion of [employer] Patient Assistance Paperwork completed and faxed to [employer] at [phone]

## 2016-07-15 NOTE — Telephone Encounter (Signed)
07/15/16 spoke with patient, wants to wait for PT due to deductible ($800) not met. May schedule after meeting it.

## 2016-07-20 ENCOUNTER — Telehealth: Payer: Self-pay | Admitting: Neurology

## 2016-07-20 NOTE — Telephone Encounter (Signed)
Patient requesting Rx for Fingolimod HCl (GILENYA) 0.5 MG CAPS called to [facility].

## 2016-07-20 NOTE — Telephone Encounter (Signed)
Rx. for emergency supply of Gilenya faxed to [facility]/fim

## 2016-07-20 NOTE — Telephone Encounter (Signed)
Gilenya rx. was faxed to [facility] on 07-11-16, with fax confirmation received.  I have refaxed it again today/fim

## 2016-07-22 ENCOUNTER — Ambulatory Visit (INDEPENDENT_AMBULATORY_CARE_PROVIDER_SITE_OTHER): Payer: BLUE CROSS/BLUE SHIELD | Admitting: Neurology

## 2016-07-22 ENCOUNTER — Encounter: Payer: Self-pay | Admitting: Neurology

## 2016-07-22 VITALS — BP 132/86 | HR 70 | Resp 16 | Ht 63.0 in | Wt 177.0 lb

## 2016-07-22 DIAGNOSIS — R5383 Other fatigue: Secondary | ICD-10-CM

## 2016-07-22 DIAGNOSIS — Q760 Spina bifida occulta: Secondary | ICD-10-CM | POA: Diagnosis not present

## 2016-07-22 DIAGNOSIS — G35 Multiple sclerosis: Secondary | ICD-10-CM

## 2016-07-22 DIAGNOSIS — R350 Frequency of micturition: Secondary | ICD-10-CM

## 2016-07-22 MED ORDER — AMPHETAMINE-DEXTROAMPHETAMINE 20 MG PO TABS: 20.0000 mg | ORAL_TABLET | Freq: Two times a day (BID) | ORAL | 0 refills | Status: DC

## 2016-07-22 NOTE — Progress Notes (Signed)
[facility]  PATIENT: Mary Beck Pelham DOB: Apr 03, 1976   _________________________________   HISTORICAL  CHIEF COMPLAINT:  Chief Complaint  Patient presents with  . Multiple Sclerosis    Sts. she continues to tolerate Gilenya well but has been out of it for one week today.  Has been approved for Pt. Assistance, and rx. for temp. supply has been sent to [facility]; pt. sts. med is supposed to arrive in 2-3 days.  One wk. samples given to last until rx. arriives.  Since last ov, she's had lumbar surgery and sts. lbp and some leg weakness have been relieved./fim    HISTORY OF PRESENT ILLNESS:  Mary Beck is a 41 year old woman who was diagnosed with MS in 2010.      Lower back pain:   She had surgery 12/222/2017 in [facility].   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   She feels left leg weakness and pain is much better now.    MS:  She feels stable and has no new exacerbations.   She is on Gilenya and tolerates it well.  She likes it much better than Copaxone or Betaseron that she had been on in the past.    Her 11/10/2015 MRI was stable.     Gait/strength/senation:  She feels gait and balance have improved since surgery. She has no arm weakness but right hand is numb    Her right hand has been numb since her major exacerbation and is bothering her more.    She notes no numbness in her legs  Vision:   She denies any current visual symptoms. However, she did have pseudotumor cerebri about 15 or 20 years ago and had serial LP's and Diamox.  She improved after weight loss.  Bladder/bowel:  Bladder is also much better since surgery.   She switched from oxybutynin to myrbetriq.   She has no recent incontinence.   She has rare bowel incontinence (5-6 times over 6 years)  Fatigue/sleep:   This is stable .   She has fatigue, worse with the heat.    Her sleep onset insomnia is helped by Ambien.  She tolerates it well and had no hangover  effect.   Adderall 20 mg once or twice a day has helped her fatigue during the day  Mood/cognition:  She has less depression since feeling better after surgery. .   She feels better since getting a new job.  She denies anxiety. She is currently on Wellbutrin with mild benefit.   She tolerates it well.  She has mild cognitive issues, better since reducing med's after surgery.     She notes mild cognitive issues verbal fluency and multitasking issues. Adderall has helped her mild focus/attnetion issues  MS HIstory:   She was diagnosed in 2010 after presenting with numbness in the feet up to her knees. She had an MRI of the brain that was abnormal. This was followed by a lumbar puncture in the CSF was also abnormal. She was diagnosed with MS and was placed on Copaxone. She had an exacerbation and was switched to Betaseron. Due to needle fatigue she was switched to Gilenya about 2 years ago. Since being on Gilenya her MS has been stable. She denies any exacerbations. She tolerates Gilenya well. She had an MRI of the brain early 2015 that was unchanged compared to her previous MRI.   MRI May 2017 shows a new large chronic focus in the left frontal  lobe not present in 2015. MRI of the cervical spine shows 3 T2 hyperintense foci  MRI Brain 11/10/15 IMPRESSION: This MRI of the brain with and without contrast shows the following: 1. Scattered T2/FLAIR hyperintense foci in the hemispheres and right cerebellum in a pattern and configuration consistent with chronic demyelinating plaque associated with multiple sclerosis. None of the foci appears to be acute. However, when compared to the MRI dated 11/07/2013, one left frontal subcortical focus has occurred during the interim. 2, There are no acute findings.  MRi Cervical 11/10/15 IMPRESSION: This MRI of the cervical spine with and without contrast shows the following: 1. There are three T2 hyperintense foci within the spinal cord as detailed above  consistent with chronic demyelinating plaque associated with multiple sclerosis. None of the foci enhances after contrast administration. 2. Mild degenerative changes at C5-C6 and C6-C7 that did not lead to any nerve root impingement. 3. There is a normal enhancement pattern.  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 2. Posterior element fusion anomaly/spina bifida at L3 and below. 3. Advanced facet disease in the lower lumbar spine. 4. 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     REVIEW OF SYSTEMS: Constitutional: No fevers, chills, sweats, or change in appetite.   She reports fatigue Eyes: No visual changes, double vision, .  However, mild eye pain Ear, nose and throat: No hearing loss, ear pain, nasal congestion, sore throat Cardiovascular: No chest pain, palpitations Respiratory: No shortness of breath at rest or with exertion.   No wheezes GastrointestinaI: No nausea, vomiting, diarrhea, abdominal pain, fecal incontinence Genitourinary: No dysuria or urinary retention.  She notes frequency and nocturia. Musculoskeletal: No neck pain.  Notes back pain Integumentary: No rash, pruritus, skin lesions Neurological: as above.  Also, occasional Headache Psychiatric: Reports mild depression but little anxiety Endocrine: No palpitations, diaphoresis, change in appetite, change in weigh or increased thirst Hematologic/Lymphatic: No anemia, purpura, petechiae. Allergic/Immunologic: No itchy/runny eyes, nasal congestion, recent allergic reactions, rashes  ALLERGIES: No Known Allergies  HOME MEDICATIONS:  Current Outpatient Prescriptions:  .  amphetamine-dextroamphetamine (ADDERALL) 20 MG tablet, Take 1 tablet (20 mg total) by mouth 2 (two) times daily., Disp: 60 tablet, Rfl: 0 .  buPROPion  (WELLBUTRIN XL) 300 MG 24 hr tablet, TAKE ONE TABLET BY MOUTH DAILY, Disp: 30 tablet, Rfl: 11 .  cephALEXin (KEFLEX) 500 MG capsule, Take 1 capsule (500 mg total) by mouth 2 (two) times daily., Disp: 14 capsule, Rfl: 0 .  cholecalciferol (VITAMIN D) 1000 UNITS tablet, Take 2,000 Units by mouth daily. Take 1 capsules daily, Disp: , Rfl:  .  cyclobenzaprine (FLEXERIL) 5 MG tablet, Take 1 tablet (5 mg total) by mouth 3 (three) times daily., Disp: 90 tablet, Rfl: 3 .  DULoxetine (CYMBALTA) 60 MG capsule, Take 1 capsule (60 mg total) by mouth daily., Disp: 60 capsule, Rfl: 11 .  etodolac (LODINE) 400 MG tablet, Take 1 tablet (400 mg total) by mouth 2 (two) times daily., Disp: 60 tablet, Rfl: 5 .  Fingolimod HCl (GILENYA) 0.5 MG CAPS, Take 1 capsule (0.5 mg total) by mouth daily., Disp: 90 capsule, Rfl: 3 .  gabapentin (NEURONTIN) 300 MG capsule, Take 1 capsule (300 mg total) by mouth 4 (four) times daily., Disp: 120 capsule, Rfl: 11 .  ibuprofen (ADVIL,MOTRIN) 800 MG tablet, Take  1 tablet (800 mg total) by mouth every 8 (eight) hours as needed for mild pain., Disp: 30 tablet, Rfl: 0 .  loratadine (CLARITIN) 10 MG tablet, Take 10 mg by mouth daily., Disp: , Rfl:  .  ondansetron (ZOFRAN ODT) 4 MG disintegrating tablet, Take 1 tablet (4 mg total) by mouth every 6 (six) hours as needed for nausea or vomiting., Disp: 30 tablet, Rfl: 0 .  oxybutynin (DITROPAN) 5 MG tablet, Take 1 tablet (5 mg total) by mouth 3 (three) times daily., Disp: 90 tablet, Rfl: 11 .  oxyCODONE-acetaminophen (PERCOCET/ROXICET) 5-325 MG tablet, Take 1-2 tablets by mouth every 6 (six) hours as needed., Disp: 25 tablet, Rfl: 0 .  PROAIR HFA 108 (90 Base) MCG/ACT inhaler, Inhale 2 puffs into the lungs every 6 (six) hours as needed for wheezing or shortness of breath., Disp: 6.7 g, Rfl: 1 .  tamsulosin (FLOMAX) 0.4 MG CAPS capsule, Take 1 capsule (0.4 mg total) by mouth daily., Disp: 30 capsule, Rfl: 0 .  zolpidem (AMBIEN) 5 MG tablet, Take 1  tablet (5 mg total) by mouth at bedtime as needed for sleep., Disp: 30 tablet, Rfl: 4  PAST MEDICAL HISTORY: Past Medical History:  Diagnosis Date  . Headache   . Multiple sclerosis (HCC)   . Pseudotumor cerebri   . Spina bifida (HCC)     PAST SURGICAL HISTORY: No past surgical history on file.  FAMILY HISTORY: Family History  Problem Relation Age of Onset  . Multiple sclerosis Neg Hx     SOCIAL HISTORY:  Social History   Social History  . Marital status: Single    Spouse name: N/A  . Number of children: N/A  . Years of education: N/A   Occupational History  . Not on file.   Social History Main Topics  . Smoking status: Never Smoker  . Smokeless tobacco: Never Used  . Alcohol use No  . Drug use: No  . Sexual activity: Yes    Birth control/ protection: None   Other Topics Concern  . Not on file   Social History Narrative  . No narrative on file     PHYSICAL EXAM  Vitals:   07/22/16 1536  BP: 132/86  Pulse: 70  Resp: 16  Weight: 177 lb (80.3 kg)  Height: 5\' 3"  (1.6 m)    Body mass index is 31.35 kg/m.   General: The patient is well-developed and well-nourished and in no acute distress   Musculoskeletal:  He has a well-healed surgical scar. Back is less tender.. There is no tenderness over the trochanteric bursae.  Neurologic Exam  Mental status: The patient is alert and oriented x 3 at the time of the examination. The patient has apparent normal recent and remote memory, with an apparently normal attention span and concentration ability.   Speech is normal.  Cranial nerves: Extraocular movements are full.     Facial symmetry is present. There is good facial sensation to soft touch bilaterally.Facial strength is normal.  Trapezius and sternocleidomastoid strength is normal. No dysarthria is noted.    No obvious hearing deficits are noted.  Motor:  Muscle bulk is normal.   Tone is normal. Strength is  5 / 5 in all 4 extremities .   Sensory:  Sensory testing showed mild reduced touch and vibration sensation in the right hand compared to the left. Legs were normal.    Coordination: Cerebellar testing reveals good finger-nose-finger and heel-to-shin bilaterally.  Gait and station: Station is normal.  Gait is normal. Tandem gait is near normal. There is no Romberg sign..    Reflexes: Deep tendon reflexes are normal and symmetric in the arms. Reflexes were increased in the knees, spread on the left.. Her right  ankle DTR reduced relative to left.    DIAGNOSTIC DATA (LABS, IMAGING, TESTING) - I reviewed patient records, labs, notes, testing and imaging myself where available.     ASSESSMENT AND PLAN  Multiple sclerosis (HCC)  Occult spina bifida  Urinary frequency  Other fatigue    1.   Her MS is stable and she will stay on Gilenya. She had lab work last month.   Her last MRI of the brain was unchanged and we can probably go another year before we need to check another MRI to determine if there is subclinical progression. 2.    Back issues are much better since her surgery and she should continue to be active and exercises as tolerated.  3.    Continue gabapentin and and etodolac (change to prn now). 4.   Renew Adderall  5.   RTC 4-5 months or sooner if new or worsening neurologic problems.   Richard A. Epimenio Foot, MD, PhD 07/22/2016, 7:12 PM Certified in Neurology, Clinical Neurophysiology, Sleep Medicine, Pain Medicine and Neuroimaging  [facility] [address], [address] [place], [place] [address] [phone]  -

## 2016-09-09 ENCOUNTER — Other Ambulatory Visit: Payer: Self-pay | Admitting: Neurology

## 2016-10-12 ENCOUNTER — Telehealth: Payer: Self-pay | Admitting: Neurology

## 2016-10-12 NOTE — Telephone Encounter (Signed)
Patient called office requesting refill for amphetamine-dextroamphetamine (ADDERALL) 20 MG tablet. °

## 2016-10-13 MED ORDER — AMPHETAMINE-DEXTROAMPHETAMINE 20 MG PO TABS: 20.0000 mg | ORAL_TABLET | Freq: Two times a day (BID) | ORAL | 0 refills | Status: DC

## 2016-10-13 NOTE — Telephone Encounter (Signed)
Rx. up front [facility]/fim 

## 2016-10-13 NOTE — Addendum Note (Signed)
Addended by: Candis Schatz I on: 10/13/2016 07:59 AM   Modules accepted: Orders

## 2016-10-13 NOTE — Telephone Encounter (Signed)
Rx. awaiting YY sig, as RAS is ooo this wk/fim 

## 2016-11-28 ENCOUNTER — Other Ambulatory Visit: Payer: Self-pay | Admitting: Neurology

## 2016-12-13 ENCOUNTER — Telehealth: Payer: Self-pay | Admitting: Neurology

## 2016-12-13 MED ORDER — AMPHETAMINE-DEXTROAMPHETAMINE 20 MG PO TABS: 20.0000 mg | ORAL_TABLET | Freq: Two times a day (BID) | ORAL | 0 refills | Status: DC

## 2016-12-13 NOTE — Addendum Note (Signed)
Addended by: Candis Schatz I on: 12/13/2016 12:58 PM   Modules accepted: Orders

## 2016-12-13 NOTE — Telephone Encounter (Signed)
Patient requesting refill of amphetamine-dextroamphetamine (ADDERALL) 20 MG tablet. ° ° °

## 2016-12-13 NOTE — Telephone Encounter (Signed)
Rx. awaiting RAS sig/fim 

## 2016-12-13 NOTE — Telephone Encounter (Signed)
Rx. up front [facility]/fim 

## 2017-01-20 ENCOUNTER — Ambulatory Visit: Payer: BLUE CROSS/BLUE SHIELD | Admitting: Neurology

## 2017-02-02 ENCOUNTER — Telehealth: Payer: Self-pay | Admitting: Neurology

## 2017-02-02 MED ORDER — AMPHETAMINE-DEXTROAMPHETAMINE 20 MG PO TABS: 20.0000 mg | ORAL_TABLET | Freq: Two times a day (BID) | ORAL | 0 refills | Status: DC

## 2017-02-02 NOTE — Telephone Encounter (Signed)
Pt called for refill of amphetamine-dextroamphetamine (ADDERALL) 20 MG tablet °

## 2017-02-02 NOTE — Telephone Encounter (Signed)
Rx. awaiting YY sig, as RAS is ooo this wk/fim 

## 2017-02-02 NOTE — Telephone Encounter (Signed)
Rx. up front [facility]/fim 

## 2017-02-02 NOTE — Addendum Note (Signed)
Addended by: Candis Schatz I on: 02/02/2017 09:19 AM   Modules accepted: Orders

## 2017-03-09 ENCOUNTER — Ambulatory Visit (INDEPENDENT_AMBULATORY_CARE_PROVIDER_SITE_OTHER): Payer: BLUE CROSS/BLUE SHIELD | Admitting: Neurology

## 2017-03-09 ENCOUNTER — Encounter: Payer: Self-pay | Admitting: Neurology

## 2017-03-09 VITALS — BP 129/86 | HR 95 | Resp 16 | Ht 63.0 in | Wt 177.0 lb

## 2017-03-09 DIAGNOSIS — R5383 Other fatigue: Secondary | ICD-10-CM

## 2017-03-09 DIAGNOSIS — Q760 Spina bifida occulta: Secondary | ICD-10-CM

## 2017-03-09 DIAGNOSIS — G8929 Other chronic pain: Secondary | ICD-10-CM | POA: Diagnosis not present

## 2017-03-09 DIAGNOSIS — G35 Multiple sclerosis: Secondary | ICD-10-CM

## 2017-03-09 DIAGNOSIS — M549 Dorsalgia, unspecified: Secondary | ICD-10-CM | POA: Diagnosis not present

## 2017-03-09 DIAGNOSIS — R350 Frequency of micturition: Secondary | ICD-10-CM

## 2017-03-09 MED ORDER — AMPHETAMINE-DEXTROAMPHETAMINE 20 MG PO TABS: 20.0000 mg | ORAL_TABLET | Freq: Two times a day (BID) | ORAL | 0 refills | Status: DC

## 2017-03-09 NOTE — Progress Notes (Signed)
[facility]  PATIENT: Mary Beck DOB: 1976/04/15   _________________________________   HISTORICAL  CHIEF COMPLAINT:  Chief Complaint  Patient presents with  . Multiple Sclerosis    Sts. she continues to tolerate Gilenya well.  Denies new or worsening sx/fim    HISTORY OF PRESENT ILLNESS:  Mary Beck is a 41 -year-old woman who was diagnosed with MS in 2010.      Lower back pain:   She had surgery 05/2016 in [facility].   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   She feels left leg weakness and pain is much better now.    Bladder function also improved.    MS:  She feels stable and has no new exacerbations.   She is on Gilenya and tolerates it well.  She likes it much better than Copaxone or Betaseron that she had been on in the past.    Her 11/10/2015 MRI was stable.     Gait/strength/senation:  Gait is doing well.    Balance is good.   She feels gait improved with surgery.   She has right hand numbness (old) but no weakness or other numbness.   Vision:   She denies any current visual symptoms. However, she did have pseudotumor cerebri about 15 or 20 years ago and had serial LP's and Diamox.  She improved after weight loss.  Bladder/bowel:  Bladder is also much better since surgery.   She is on oxybutynin with benefit.   She has only had a couple episodes of incontinence, both with urge first.  Fatigue/sleep:   This is stable .   She has fatigue, worse with the heat.  Her sleep onset insomnia is much better and she has only taken one Ambien in the last 2 months.   Adderall 20 mg once or twice a day has helped her fatigue during the day  Mood/cognition:  She feels her mood is doing fairly well in general. She still has mild depression at times. She tolerates  it well. She still notes some difficulties with focus and attention. Adderall has helped this.  MS HIstory:   She was diagnosed in 2010 after presenting with numbness  in the feet up to her knees. She had an MRI of the brain that was abnormal. This was followed by a lumbar puncture in the CSF was also abnormal. She was diagnosed with MS and was placed on Copaxone. She had an exacerbation and was switched to Betaseron. Due to needle fatigue she was switched to Gilenya about 2 years ago. Since being on Gilenya her MS has been stable. She denies any exacerbations. She tolerates Gilenya well. She had an MRI of the brain early 2015 that was unchanged compared to her previous MRI.   MRI May 2017 shows a new large chronic focus in the left frontal lobe not present in 2015. MRI of the cervical spine shows 3 T2 hyperintense foci  MRI Brain 11/10/15 IMPRESSION: This MRI of the brain with and without contrast shows the following: 1. Scattered T2/FLAIR hyperintense foci in the hemispheres and right cerebellum in a pattern and configuration consistent with chronic demyelinating plaque associated with multiple sclerosis. None of the foci appears to be acute. However, when compared to the MRI dated 11/07/2013, one left frontal subcortical focus has occurred during the interim. 2, There are no acute findings.  MRi Cervical 11/10/15 IMPRESSION: This MRI of the cervical spine with and without contrast shows the following:  1. There are three T2 hyperintense foci within the spinal cord as detailed above consistent with chronic demyelinating plaque associated with multiple sclerosis. None of the foci enhances after contrast administration. 2. Mild degenerative changes at C5-C6 and C6-C7 that did not lead to any nerve root impingement. 3. There is a normal enhancement pattern.  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 2. Posterior element fusion anomaly/spina bifida at L3 and below. 3. Advanced facet disease in the lower lumbar spine. 4. 14 mm  right-sided synovial cyst with mass effect on the thecal sac and the right S1 nerve root.     REVIEW OF SYSTEMS: Constitutional: No fevers, chills, sweats, or change in appetite.   She reports fatigue Eyes: No visual changes, double vision, .  However, mild eye pain Ear, nose and throat: No hearing loss, ear pain, nasal congestion, sore throat Cardiovascular: No chest pain, palpitations Respiratory: No shortness of breath at rest or with exertion.   No wheezes GastrointestinaI: No nausea, vomiting, diarrhea, abdominal pain, fecal incontinence Genitourinary: No dysuria or urinary retention.  She notes frequency and nocturia. Musculoskeletal: No neck pain.  Notes back pain Integumentary: No rash, pruritus, skin lesions Neurological: as above.  Also, occasional Headache Psychiatric: Reports mild depression but little anxiety Endocrine: No palpitations, diaphoresis, change in appetite, change in weigh or increased thirst Hematologic/Lymphatic: No anemia, purpura, petechiae. Allergic/Immunologic: No itchy/runny eyes, nasal congestion, recent allergic reactions, rashes  ALLERGIES: No Known Allergies  HOME MEDICATIONS:  Current Outpatient Prescriptions:  .  amphetamine-dextroamphetamine (ADDERALL) 20 MG tablet, Take 1 tablet (20 mg total) by mouth 2 (two) times daily., Disp: 60 tablet, Rfl: 0 .  buPROPion (WELLBUTRIN XL) 300 MG 24 hr tablet, TAKE ONE TABLET BY MOUTH DAILY, Disp: 30 tablet, Rfl: 11 .  cephALEXin (KEFLEX) 500 MG capsule, Take 1 capsule (500 mg total) by mouth 2 (two) times daily., Disp: 14 capsule, Rfl: 0 .  cholecalciferol (VITAMIN D) 1000 UNITS tablet, Take 2,000 Units by mouth daily. Take 1 capsules daily, Disp: , Rfl:  .  cyclobenzaprine (FLEXERIL) 5 MG tablet, Take 1 tablet (5 mg total) by mouth 3 (three) times daily., Disp: 90 tablet, Rfl: 3 .  DULoxetine (CYMBALTA) 60 MG capsule, TAKE ONE CAPSULE BY MOUTH DAILY, Disp: 60 capsule, Rfl: 6 .  etodolac (LODINE) 400 MG  tablet, TAKE ONE TABLET BY MOUTH TWICE A DAY, Disp: 60 tablet, Rfl: 0 .  Fingolimod HCl (GILENYA) 0.5 MG CAPS, Take 1 capsule (0.5 mg total) by mouth daily., Disp: 90 capsule, Rfl: 3 .  gabapentin (NEURONTIN) 300 MG capsule, Take 1 capsule (300 mg total) by mouth 4 (four) times daily., Disp: 120 capsule, Rfl: 11 .  ibuprofen (ADVIL,MOTRIN) 800 MG tablet, Take 1 tablet (800 mg total) by mouth every 8 (eight) hours as needed for mild pain., Disp: 30 tablet, Rfl: 0 .  loratadine (CLARITIN) 10 MG tablet, Take 10 mg by mouth daily., Disp: , Rfl:  .  ondansetron (ZOFRAN ODT) 4 MG disintegrating tablet, Take 1 tablet (4 mg total) by mouth every 6 (six) hours as needed for nausea or vomiting., Disp: 30 tablet, Rfl: 0 .  oxybutynin (DITROPAN) 5 MG tablet, TAKE ONE TABLET BY MOUTH THREE TIMES A DAY, Disp: 90 tablet, Rfl: 11 .  oxyCODONE-acetaminophen (PERCOCET/ROXICET) 5-325 MG tablet, Take 1-2 tablets by mouth every 6 (six) hours as needed., Disp: 25 tablet, Rfl: 0 .  PROAIR HFA 108 (90 Base) MCG/ACT inhaler, Inhale 2 puffs into the lungs every 6 (six) hours as needed for wheezing or shortness of breath., Disp: 6.7 g, Rfl: 1 .  tamsulosin (FLOMAX) 0.4 MG CAPS capsule, Take 1 capsule (0.4 mg total) by mouth daily., Disp: 30 capsule, Rfl: 0 .  zolpidem (AMBIEN) 5 MG tablet, Take 1 tablet (5 mg total) by mouth at bedtime as needed for sleep., Disp: 30 tablet, Rfl: 4  PAST MEDICAL HISTORY: Past Medical History:  Diagnosis Date  . Headache   . Multiple sclerosis (HCC)   . Pseudotumor cerebri   . Spina bifida (HCC)     PAST SURGICAL HISTORY: No past surgical history on file.  FAMILY HISTORY: Family History  Problem Relation Age of Onset  . Multiple sclerosis Neg Hx     SOCIAL HISTORY:  Social History   Social History  . Marital status: Single    Spouse name: N/A  . Number of children: N/A  . Years of education: N/A   Occupational History  . Not on file.   Social History Main Topics  .  Smoking status: Never Smoker  . Smokeless tobacco: Never Used  . Alcohol use No  . Drug use: No  . Sexual activity: Yes    Birth control/ protection: None   Other Topics Concern  . Not on file   Social History Narrative  . No narrative on file     PHYSICAL EXAM  Vitals:   03/09/17 1539  BP: 129/86  Pulse: 95  Resp: 16  Weight: 177 lb (80.3 kg)  Height: 5\' 3"  (1.6 m)    Body mass index is 31.35 kg/m.   General: The patient is well-developed and well-nourished and in no acute distress    Neurologic Exam  Mental status: The patient is alert and oriented x 3 at the time of the examination. The patient has apparent normal recent and remote memory, with an apparently normal attention span and concentration ability.   Speech is normal.  Cranial nerves: Extraocular movements are full.     Facial symmetry is present. There is good facial sensation to soft touch bilaterally.Facial strength is normal.  Trapezius and sternocleidomastoid strength is normal. No dysarthria is noted.    No obvious hearing deficits are noted.  Motor:  Muscle bulk is normal.   Tone is normal. Strength is  5 / 5 in all 4 extremities .   Sensory: She has mildly reduced touch and vibration sensation in the right hand compared to the left hand. Leg sensation was normal.   Coordination: Cerebellar testing reveals good finger-nose-finger and heel-to-shin bilaterally.  Gait and station: Station is normal.   The gait is normal and the tandem gait is minimally wide. There is no Romberg sign..    Reflexes: Deep tendon reflexes are normal and symmetric in the arms. Reflexes were increased in the knees, spread on the left.. Her right  ankle DTR reduced relative to left.    DIAGNOSTIC DATA (LABS, IMAGING, TESTING) - I reviewed patient records, labs, notes, testing and imaging myself where available.     ASSESSMENT AND PLAN  MS (multiple sclerosis) (HCC) - Plan: CBC with Differential/Platelet, Hepatic  function panel  Urinary frequency  Other fatigue  Chronic back pain, unspecified back location, unspecified back pain laterality  Occult spina bifida    1.   She will continue on Gilenya. Her MS has been mostly stable. We will check some lab work to make sure that there  is not severe lymphopenia or any hepatotoxicity. Sometime next year we will recheck the MRI of the brain to determine if any subclinical progression is occurring. If present, other DMT's will be discussed.  2.    Continue to be active and exercises as tolerated.  3.    Continue gabapentin and and etodolac (change to prn now). 4.    Renew Adderall  5.    RTC 4-5 months or sooner if new or worsening neurologic problems.   Richard A. Epimenio Foot, MD, PhD 03/09/2017, 3:59 PM Certified in Neurology, Clinical Neurophysiology, Sleep Medicine, Pain Medicine and Neuroimaging  [facility] [address], [address] [place], [place] [address] [phone]  -

## 2017-03-10 LAB — CBC WITH DIFFERENTIAL/PLATELET
BASOS: 0 %
Basophils Absolute: 0 10*3/uL (ref 0.0–0.2)
EOS (ABSOLUTE): 0 10*3/uL (ref 0.0–0.4)
Eos: 0 %
HEMATOCRIT: 36.5 % (ref 34.0–46.6)
HEMOGLOBIN: 12 g/dL (ref 11.1–15.9)
IMMATURE GRANULOCYTES: 0 %
Immature Grans (Abs): 0 10*3/uL (ref 0.0–0.1)
LYMPHS ABS: 0.5 10*3/uL — AB (ref 0.7–3.1)
Lymphs: 12 %
MCH: 29.8 pg (ref 26.6–33.0)
MCHC: 32.9 g/dL (ref 31.5–35.7)
MCV: 91 fL (ref 79–97)
MONOCYTES: 9 %
Monocytes Absolute: 0.4 10*3/uL (ref 0.1–0.9)
Neutrophils Absolute: 3.6 10*3/uL (ref 1.4–7.0)
Neutrophils: 79 %
Platelets: 301 10*3/uL (ref 150–379)
RBC: 4.03 x10E6/uL (ref 3.77–5.28)
RDW: 14.8 % (ref 12.3–15.4)
WBC: 4.6 10*3/uL (ref 3.4–10.8)

## 2017-03-10 LAB — HEPATIC FUNCTION PANEL
ALBUMIN: 4.1 g/dL (ref 3.5–5.5)
ALK PHOS: 55 IU/L (ref 39–117)
ALT: 13 IU/L (ref 0–32)
AST: 16 IU/L (ref 0–40)
BILIRUBIN TOTAL: 0.2 mg/dL (ref 0.0–1.2)
Bilirubin, Direct: 0.11 mg/dL (ref 0.00–0.40)
Total Protein: 6.4 g/dL (ref 6.0–8.5)

## 2017-03-11 ENCOUNTER — Telehealth: Payer: Self-pay | Admitting: *Deleted

## 2017-03-11 NOTE — Telephone Encounter (Signed)
-----   Message from Asa Lente, MD sent at 03/10/2017  6:44 PM EDT ----- Please let the patient know that the lab work is fine (low lymphocytes are expected with Gilenya)

## 2017-03-11 NOTE — Telephone Encounter (Signed)
I have spoken with Mary Beck this morning and per RAS, advised that lab work is fine--lymphocytes are some low, but this is b/c she is taking Gilenya.  She verbalized understanding of same/fim

## 2017-06-08 ENCOUNTER — Telehealth: Payer: Self-pay | Admitting: Neurology

## 2017-06-08 MED ORDER — AMPHETAMINE-DEXTROAMPHETAMINE 20 MG PO TABS: 20.0000 mg | ORAL_TABLET | Freq: Two times a day (BID) | ORAL | 0 refills | Status: DC

## 2017-06-08 NOTE — Telephone Encounter (Signed)
Pt request refill for amphetamine-dextroamphetamine (ADDERALL) 20 MG tablet °

## 2017-06-08 NOTE — Telephone Encounter (Signed)
Rx. awaiting RAS sig/fim 

## 2017-06-08 NOTE — Telephone Encounter (Signed)
Rx. up front [facility]/fim 

## 2017-06-12 IMAGING — CT CT RENAL STONE PROTOCOL
2 of 4 series · 17 of 46 positions shown, 19 images · non-contrast
Comparison: None.

CLINICAL DATA: Left flank pain today.  Similar episode 5 days ago.

EXAM:
CT ABDOMEN AND PELVIS WITHOUT CONTRAST
TECHNIQUE: Multidetector CT imaging of the abdomen and pelvis was performed
following the standard protocol without IV contrast.

[Series 2: axial st · axial · 0.82mm/px · z∈[-418,+2]mm · 14 of 93 slices shown, 16 images]
[im 5/93  soft-tissue]
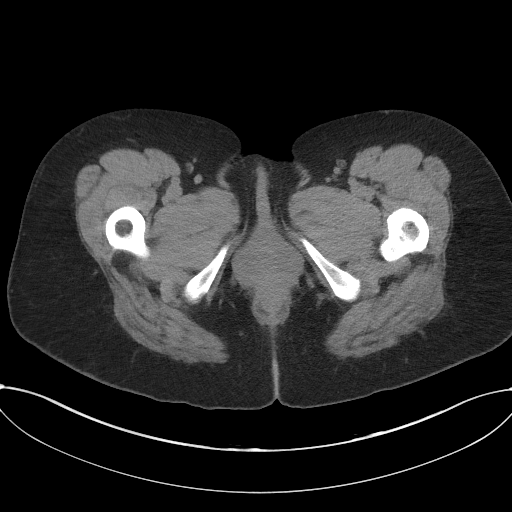
[im 5/93  bone]
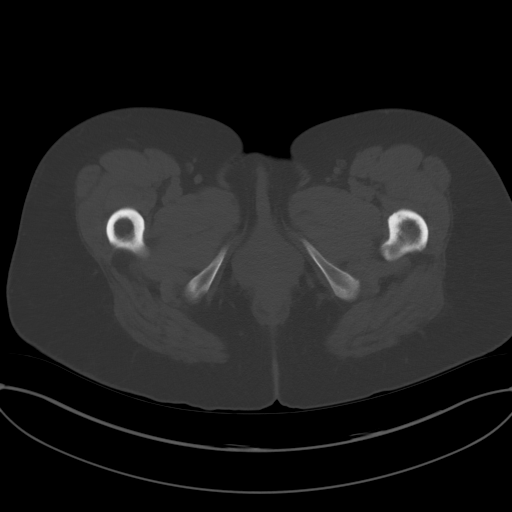
[im 13/93  soft-tissue]
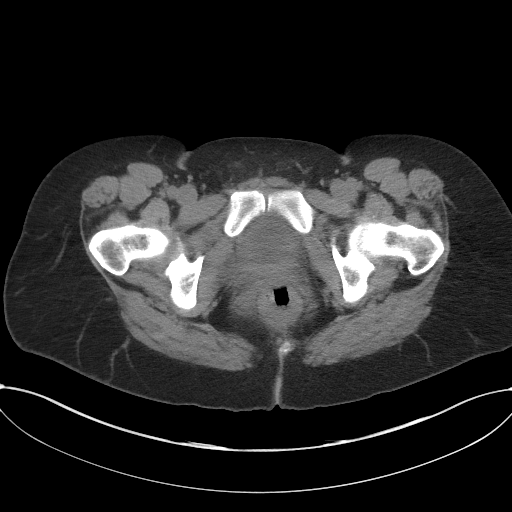
[im 17/93  soft-tissue]
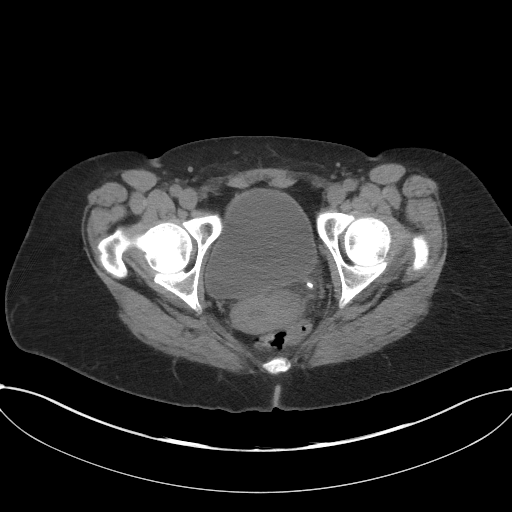
[im 25/93  soft-tissue]
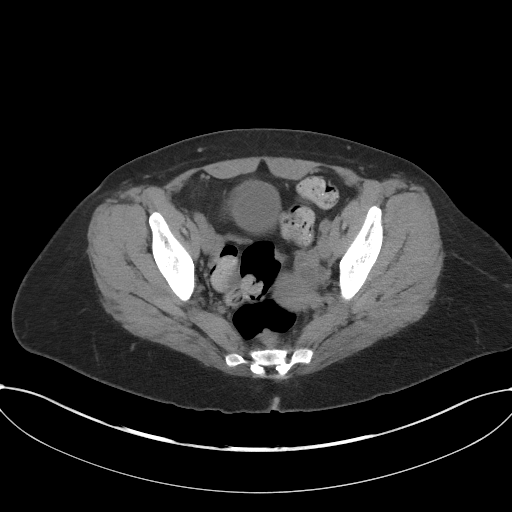
[im 33/93  soft-tissue]
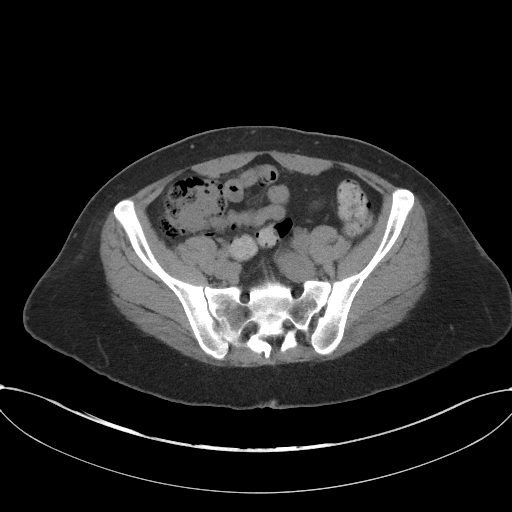
[im 37/93  soft-tissue]
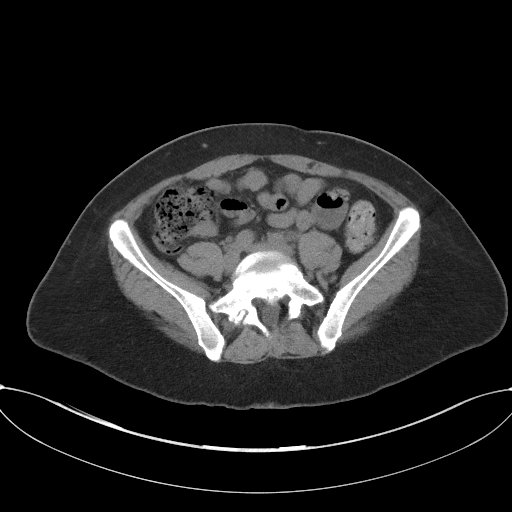
[im 45/93  soft-tissue]
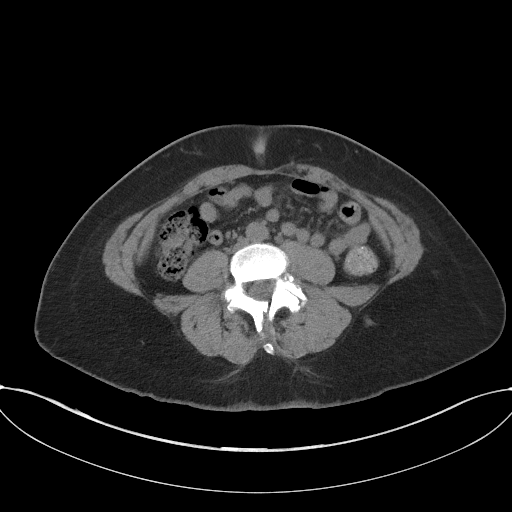
[im 49/93  soft-tissue]
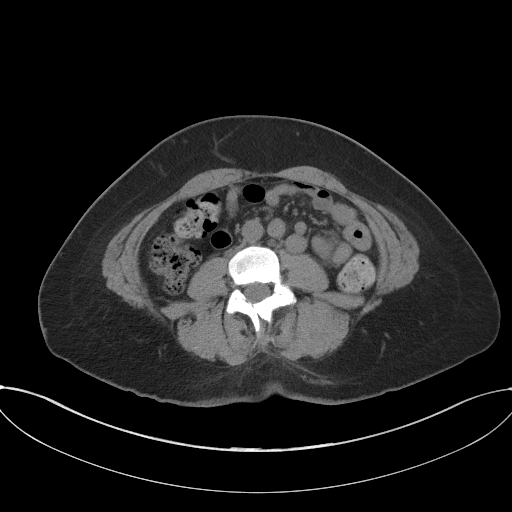
[im 57/93  soft-tissue]
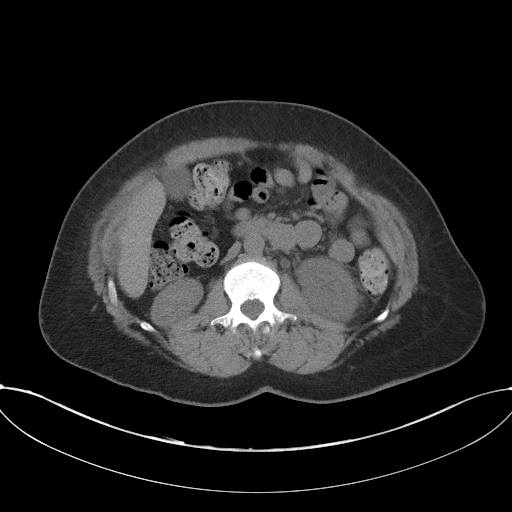
[im 57/93  bone]
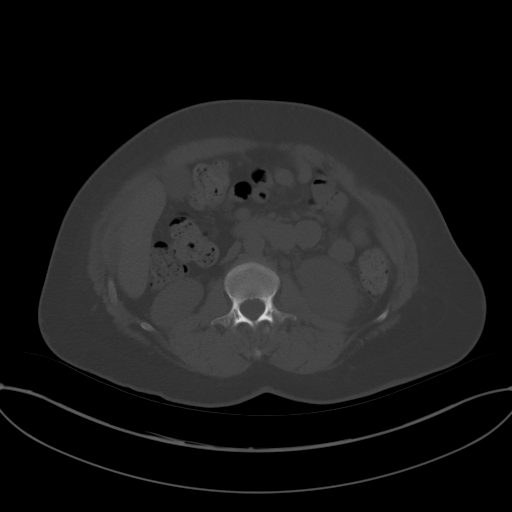
[im 61/93  soft-tissue]
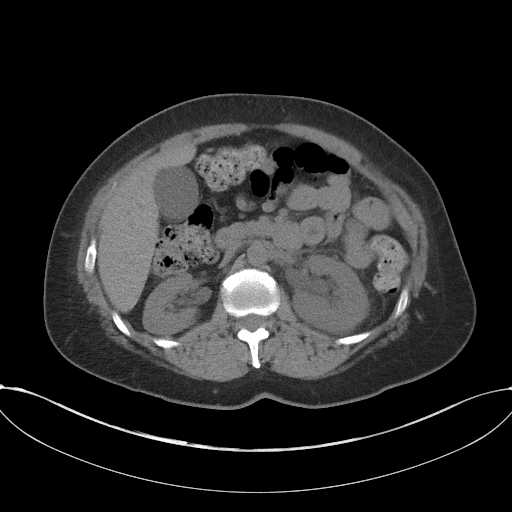
[im 69/93  soft-tissue]
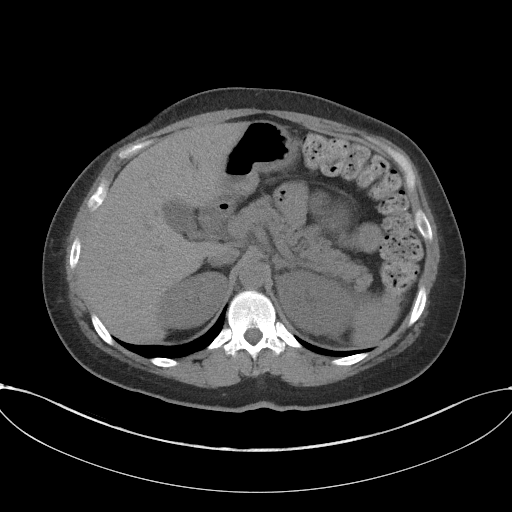
[im 77/93  soft-tissue]
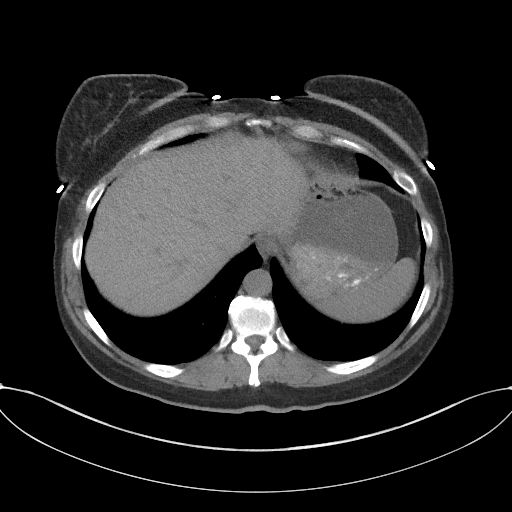
[im 81/93  soft-tissue]
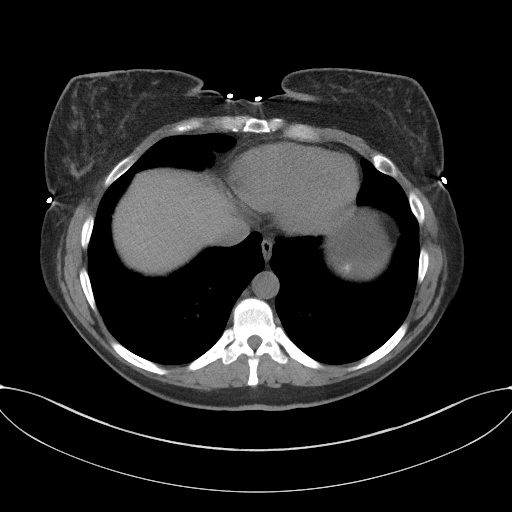
[im 89/93  soft-tissue]
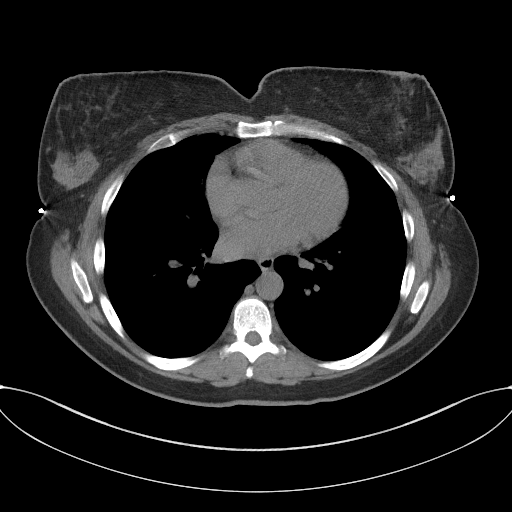

[Series 4: coronal st · coronal · 0.86mm/px · 3 of 72 slices shown]
[im 24/72  soft-tissue]
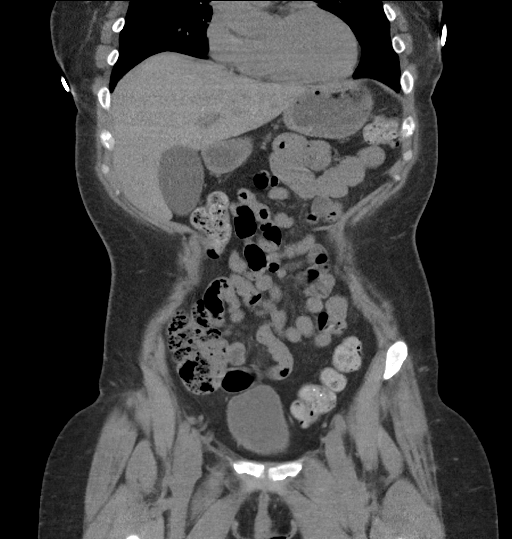
[im 32/72  soft-tissue]
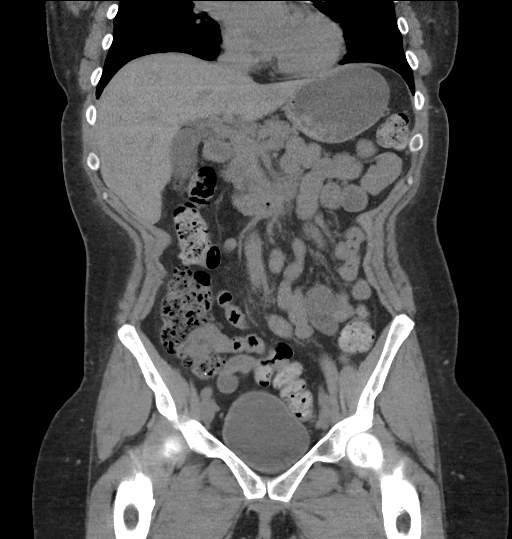
[im 40/72  soft-tissue]
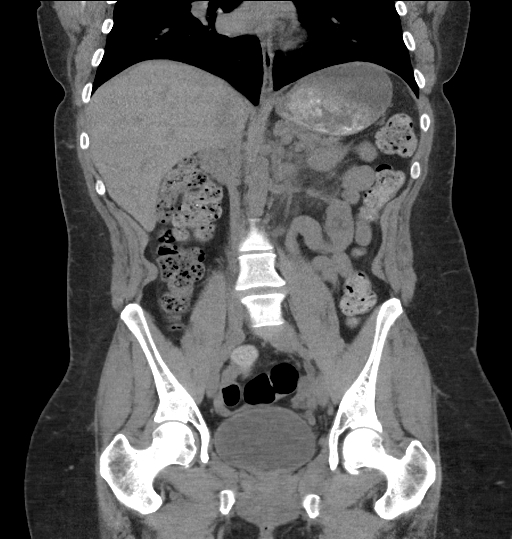

[17 of 46 positions shown; findings below may reference images not displayed]

FINDINGS: Lower chest: Lung bases are clear.

Hepatobiliary: No focal liver abnormality is seen. No gallstones,
gallbladder wall thickening, or biliary dilatation.

Pancreas: Unremarkable. No pancreatic ductal dilatation or
surrounding inflammatory changes.

Spleen: Normal in size without focal abnormality.

Adrenals/Urinary Tract: No adrenal gland nodules. Stone in the
distal left ureter just above the ureterovesical junction measuring
5 mm diameter. There is proximal left renal hydronephrosis and
hydroureter with stranding around the left kidney and ureter. Right
kidney and ureter are unremarkable. Bladder wall is not thickened.

Stomach/Bowel: Stomach is within normal limits. Appendix appears
normal. No evidence of bowel wall thickening, distention, or
inflammatory changes.

Vascular/Lymphatic: No significant vascular findings are present. No
enlarged abdominal or pelvic lymph nodes.

Reproductive: Uterus and bilateral adnexa are unremarkable.

Other: No abdominal wall hernia or abnormality. No abdominopelvic
ascites.

Musculoskeletal: No acute or significant osseous findings.
IMPRESSION: 5 mm stone in the distal left ureter with moderate proximal
obstruction.

## 2017-07-12 ENCOUNTER — Other Ambulatory Visit: Payer: Self-pay | Admitting: Neurology

## 2017-07-26 ENCOUNTER — Telehealth: Payer: Self-pay | Admitting: Neurology

## 2017-07-26 NOTE — Telephone Encounter (Signed)
Pt has questions about her pre auth that she is needing for her GILENYA and also needs a refill for her ADDERALL. Please call. Thank you.

## 2017-07-29 ENCOUNTER — Telehealth: Payer: Self-pay | Admitting: *Deleted

## 2017-07-29 MED ORDER — AMPHETAMINE-DEXTROAMPHETAMINE 20 MG PO TABS: 20.0000 mg | ORAL_TABLET | Freq: Two times a day (BID) | ORAL | 0 refills | Status: DC

## 2017-07-29 NOTE — Telephone Encounter (Signed)
PA for Gilenya 0.5mg  capsules, #30/30 completed by phone with [facility], phone# [phone]. Dx: RRMS (G35). Tried and failed meds: Copaxone (ineffective), Betaseron (inj. site fatigue).  Has been on Gilenya since 2014/fim

## 2017-07-29 NOTE — Telephone Encounter (Signed)
LMOM Adderall rx. will be ready today, and I will work on Gilenya PA/fim

## 2017-07-29 NOTE — Telephone Encounter (Addendum)
Pt called to check on status of PA and RX for adderall. Call taken on 07/26/17 was not routed.

## 2017-07-29 NOTE — Telephone Encounter (Signed)
Adderall rx. up front [facility], and Gilenya PA completed by phone; info pended to clinical pharmacist. [facility] to let pt. know this has been done/fim

## 2017-08-01 NOTE — Telephone Encounter (Signed)
Fax received from [facility] (phone# [phone].  No Pa required for Gilenya as it is on pt's list of covered drugs.  If pharmacy has trouble processing rx., they should call the pharmacy help desk at [phone]./fim

## 2017-08-08 ENCOUNTER — Telehealth: Payer: Self-pay | Admitting: *Deleted

## 2017-08-08 MED ORDER — FINGOLIMOD HCL 0.5 MG PO CAPS: 1.0000 | ORAL_CAPSULE | Freq: Every day | ORAL | 3 refills | Status: DC

## 2017-08-08 NOTE — Telephone Encounter (Signed)
Gilenya escribed to [facility] in response to faxed request from them/fim

## 2017-08-15 ENCOUNTER — Telehealth: Payer: Self-pay

## 2017-08-15 NOTE — Telephone Encounter (Signed)
We received a prior authorization request for this medication. I have completed and submitted the PA on Cover My Meds and should have a determination within 48-72 hours.  Cover My Meds Key: [id number] - PA Case ID: [id number]

## 2017-08-18 NOTE — Telephone Encounter (Signed)
Request Reference Number: [id number]. AMPHET/DEXTR TAB 20MG  is denied for not meeting the prior authorization requirement(s). For further questions, call [phone].  Can be approved if pt has one of the following diagnoses:  (a) ADHD or ADD (b) Depression (c) Narcolepsy (d) Other hypersomnia of central origin (e) Autism Spectrum Disorder (f) Mental fatigue secondary to traumatic brain injury (g) Fatigue associated with medical illness in patients in palliative or end of life care.

## 2017-08-18 NOTE — Telephone Encounter (Signed)
Appeal letter written and faxed to [phone]. I faxed letter along with original denial and last office visit.

## 2017-09-06 NOTE — Telephone Encounter (Signed)
Fax received from [facility].  Amphetamine/Dextroamphetamine approved thru 08/20/18.  Transaction# [id number]. Plan: [employer]. Plan# [id number].  ID: [id number]

## 2017-09-07 ENCOUNTER — Other Ambulatory Visit: Payer: Self-pay

## 2017-09-07 ENCOUNTER — Ambulatory Visit: Payer: PRIVATE HEALTH INSURANCE | Admitting: Neurology

## 2017-09-07 ENCOUNTER — Encounter: Payer: Self-pay | Admitting: Neurology

## 2017-09-07 VITALS — BP 129/88 | HR 111 | Resp 18 | Ht 63.0 in | Wt 193.5 lb

## 2017-09-07 DIAGNOSIS — M5441 Lumbago with sciatica, right side: Secondary | ICD-10-CM

## 2017-09-07 DIAGNOSIS — Q057 Lumbar spina bifida without hydrocephalus: Secondary | ICD-10-CM | POA: Diagnosis not present

## 2017-09-07 DIAGNOSIS — G35 Multiple sclerosis: Secondary | ICD-10-CM | POA: Diagnosis not present

## 2017-09-07 DIAGNOSIS — M7138 Other bursal cyst, other site: Secondary | ICD-10-CM | POA: Diagnosis not present

## 2017-09-07 DIAGNOSIS — Z9889 Other specified postprocedural states: Secondary | ICD-10-CM | POA: Diagnosis not present

## 2017-09-07 DIAGNOSIS — R5383 Other fatigue: Secondary | ICD-10-CM | POA: Diagnosis not present

## 2017-09-07 DIAGNOSIS — R350 Frequency of micturition: Secondary | ICD-10-CM | POA: Diagnosis not present

## 2017-09-07 DIAGNOSIS — Q760 Spina bifida occulta: Secondary | ICD-10-CM

## 2017-09-07 MED ORDER — AMPHETAMINE-DEXTROAMPHETAMINE 20 MG PO TABS: 20.0000 mg | ORAL_TABLET | Freq: Two times a day (BID) | ORAL | 0 refills | Status: DC

## 2017-09-07 NOTE — Progress Notes (Signed)
[facility]  PATIENT: Mary Beck DOB: 1976-02-21   _________________________________   HISTORICAL  CHIEF COMPLAINT:  Chief Complaint  Patient presents with  . Multiple Sclerosis    Sts. she continues to tolerate Gilenya well.  Has  been having more right hip pain./fim    HISTORY OF PRESENT ILLNESS:  Mary Beck is a 42 -year-old woman who was diagnosed with MS in 2010.      Update 09/07/2017:    She notes a pinching sensation in the right buttock/lower back that radiates some into the right leg to mid-thigh, worsening the last couple months.   Standing a long time or walking increases the pain.  She notes the leg will give out when the pain is worse but not otherwise.    She does not note numbness in the leg.    She had surgery for a tethered cord, diastematomyelia and spina bifida    She also had a right L5-S1 synovial cyst resection and L2L3-L5S1 decompressive laminectomy  She feels her MS is mostly stable.   She is on Gilenya and she tolerates it well.  Her gait is sometimes a little off balance but she has no recent falls.  Her bladder has done better since her lumbar surgery.   Vision is doing well.    Her fatigue is a problem.   She is sleeping ok most nights and no longer uses Ambien.     Mood is reduced with mild depression.  Cognition is fine.      From 03/09/2017 Lower back pain:   She had surgery 05/2016 in [facility].   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   She feels left leg weakness and pain is much better now.    Bladder function also improved.    MS:  She feels stable and has no new exacerbations.   She is on Gilenya and tolerates it well.  She likes it much better than Copaxone or Betaseron that she had been on in the past.    Her 11/10/2015 MRI was stable.     Gait/strength/senation:  Gait is doing well.    Balance is good.   She feels gait improved with surgery.   She has right hand numbness (old) but no  weakness or other numbness.   Vision:   She denies any current visual symptoms. However, she did have pseudotumor cerebri about 15 or 20 years ago and had serial LP's and Diamox.  She improved after weight loss.  Bladder/bowel:  Bladder is also much better since surgery.   She is on oxybutynin with benefit.   She has only had a couple episodes of incontinence, both with urge first.  Fatigue/sleep:   This is stable .   She has fatigue, worse with the heat.  Her sleep onset insomnia is much better and she has only taken one Ambien in the last 2 months.   Adderall 20 mg once or twice a day has helped her fatigue during the day  Mood/cognition:  She feels her mood is doing fairly well in general. She still has mild depression at times. She tolerates  it well. She still notes some difficulties with focus and attention. Adderall has helped this.  MS HIstory:   She was diagnosed in 2010 after presenting with numbness in the feet up to her knees. She had an MRI of the brain that was abnormal. This was followed by a lumbar puncture in the  CSF was also abnormal. She was diagnosed with MS and was placed on Copaxone. She had an exacerbation and was switched to Betaseron. Due to needle fatigue she was switched to Gilenya about 2 years ago. Since being on Gilenya her MS has been stable. She denies any exacerbations. She tolerates Gilenya well. She had an MRI of the brain early 2015 that was unchanged compared to her previous MRI.   MRI May 2017 shows a new large chronic focus in the left frontal lobe not present in 2015. MRI of the cervical spine shows 3 T2 hyperintense foci  MRI Brain 11/10/15 IMPRESSION: This MRI of the brain with and without contrast shows the following: 1. Scattered T2/FLAIR hyperintense foci in the hemispheres and right cerebellum in a pattern and configuration consistent with chronic demyelinating plaque associated with multiple sclerosis. None of the foci appears to be acute. However, when  compared to the MRI dated 11/07/2013, one left frontal subcortical focus has occurred during the interim. 2, There are no acute findings.  MRi Cervical 11/10/15 IMPRESSION: This MRI of the cervical spine with and without contrast shows the following: 1. There are three T2 hyperintense foci within the spinal cord as detailed above consistent with chronic demyelinating plaque associated with multiple sclerosis. None of the foci enhances after contrast administration. 2. Mild degenerative changes at C5-C6 and C6-C7 that did not lead to any nerve root impingement. 3. There is a normal enhancement pattern.  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 2. Posterior element fusion anomaly/spina bifida at L3 and below. 3. Advanced facet disease in the lower lumbar spine. 4. 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     REVIEW OF SYSTEMS: Constitutional: No fevers, chills, sweats, or change in appetite.   She reports fatigue Eyes: No visual changes, double vision, .  However, mild eye pain Ear, nose and throat: No hearing loss, ear pain, nasal congestion, sore throat Cardiovascular: No chest pain, palpitations Respiratory: No shortness of breath at rest or with exertion.   No wheezes GastrointestinaI: No nausea, vomiting, diarrhea, abdominal pain, fecal incontinence Genitourinary: No dysuria or urinary retention.  She notes frequency and nocturia. Musculoskeletal: No neck pain.  Notes back pain Integumentary: No rash, pruritus, skin lesions Neurological: as above.  Also, occasional Headache Psychiatric: Reports mild depression but little anxiety Endocrine: No palpitations, diaphoresis, change in appetite, change in weigh or increased thirst Hematologic/Lymphatic: No anemia, purpura,  petechiae. Allergic/Immunologic: No itchy/runny eyes, nasal congestion, recent allergic reactions, rashes  ALLERGIES: No Known Allergies  HOME MEDICATIONS:  Current Outpatient Medications:  .  amphetamine-dextroamphetamine (ADDERALL) 20 MG tablet, Take 1 tablet (20 mg total) by mouth 2 (two) times daily., Disp: 60 tablet, Rfl: 0 .  buPROPion (WELLBUTRIN XL) 300 MG 24 hr tablet, TAKE ONE TABLET BY MOUTH DAILY, Disp: 30 tablet, Rfl: 11 .  cholecalciferol (VITAMIN D) 1000 UNITS tablet, Take 2,000 Units by mouth daily. Take 1 capsules daily, Disp: , Rfl:  .  DULoxetine (CYMBALTA) 60 MG capsule, TAKE ONE CAPSULE BY MOUTH DAILY, Disp: 60 capsule, Rfl: 6 .  Fingolimod HCl (GILENYA) 0.5 MG CAPS, Take 1 capsule (0.5 mg total) by mouth daily., Disp: 90 capsule, Rfl: 3 .  ibuprofen (ADVIL,MOTRIN) 800 MG tablet, Take 1 tablet (800 mg total) by mouth every 8 (eight) hours as needed for  mild pain., Disp: 30 tablet, Rfl: 0 .  loratadine (CLARITIN) 10 MG tablet, Take 10 mg by mouth daily., Disp: , Rfl:  .  oxybutynin (DITROPAN) 5 MG tablet, TAKE ONE TABLET BY MOUTH THREE TIMES A DAY, Disp: 90 tablet, Rfl: 11 .  PROAIR HFA 108 (90 Base) MCG/ACT inhaler, Inhale 2 puffs into the lungs every 6 (six) hours as needed for wheezing or shortness of breath., Disp: 6.7 g, Rfl: 1 .  etodolac (LODINE) 400 MG tablet, TAKE ONE TABLET BY MOUTH TWICE A DAY (Patient not taking: Reported on 09/07/2017), Disp: 60 tablet, Rfl: 0  PAST MEDICAL HISTORY: Past Medical History:  Diagnosis Date  . Headache   . Multiple sclerosis (HCC)   . Pseudotumor cerebri   . Spina bifida (HCC)     PAST SURGICAL HISTORY: History reviewed. No pertinent surgical history.  FAMILY HISTORY: Family History  Problem Relation Age of Onset  . Multiple sclerosis Neg Hx     SOCIAL HISTORY:  Social History   Socioeconomic History  . Marital status: Single    Spouse name: Not on file  . Number of children: Not on file  . Years of education:  Not on file  . Highest education level: Not on file  Social Needs  . Financial resource strain: Not on file  . Food insecurity - worry: Not on file  . Food insecurity - inability: Not on file  . Transportation needs - medical: Not on file  . Transportation needs - non-medical: Not on file  Occupational History  . Not on file  Tobacco Use  . Smoking status: Never Smoker  . Smokeless tobacco: Never Used  Substance and Sexual Activity  . Alcohol use: No    Alcohol/week: 0.0 oz  . Drug use: No  . Sexual activity: Yes    Birth control/protection: None  Other Topics Concern  . Not on file  Social History Narrative  . Not on file     PHYSICAL EXAM  Vitals:   09/07/17 1613  BP: 129/88  Pulse: (!) 111  Resp: 18  Weight: 193 lb 8 oz (87.8 kg)  Height: 5\' 3"  (1.6 m)    Body mass index is 34.28 kg/m.   General: The patient is well-developed and well-nourished and in no acute distress    Neurologic Exam  Mental status: The patient is alert and oriented x 3 at the time of the examination. The patient has apparent normal recent and remote memory, with an apparently normal attention span and concentration ability.   Speech is normal.  Cranial nerves: Extraocular movements are full.     Facial symmetry is present. There is good facial sensation to soft touch bilaterally.Facial strength is normal.  Trapezius and sternocleidomastoid strength is normal. No dysarthria is noted.    No obvious hearing deficits are noted.  Motor:  Muscle bulk is normal.   Tone is normal. Strength is  5 / 5 in all 4 extremities .   Sensory: She has mildly reduced touch and vibration sensation in the right hand compared to the left hand. Leg sensation was normal.   Coordination: Cerebellar testing reveals good finger-nose-finger and heel-to-shin bilaterally.  Gait and station: Station is normal.   The gait is normal and the tandem gait is minimally wide. There is no Romberg sign..    Reflexes: Deep  tendon reflexes are normal and symmetric in the arms. Reflexes were increased in the knees, spread on the left.. Her right  ankle DTR reduced relative  to left.    DIAGNOSTIC DATA (LABS, IMAGING, TESTING) - I reviewed patient records, labs, notes, testing and imaging myself where available.     ASSESSMENT AND PLAN  Multiple sclerosis (HCC) - Plan: MR BRAIN W WO CONTRAST, MR CERVICAL SPINE W WO CONTRAST, MR THORACIC SPINE WO CONTRAST, CBC with Differential/Platelet  Spina bifida of lumbosacral region without hydrocephalus (HCC) - Plan: MR THORACIC SPINE WO CONTRAST, MR Lumbar Spine W Wo Contrast  Right-sided low back pain with right-sided sciatica, unspecified chronicity  Synovial cyst of lumbar facet joint  Occult spina bifida  Other fatigue  Urinary frequency  History of lumbar surgery    1.   She will continue on Gilenya. Her MS has been mostly stable. We will check some lab work to make sure that there is not severe lymphopenia or any hepatotoxicity. Sometime next year we will recheck the MRI of the brain to determine if any subclinical progression is occurring. If present, other DMT's will be discussed.  2.    Continue to be active and exercises as tolerated.  3.    Continue gabapentin and and etodolac (change to prn now). 4.    Renew Adderall  5.    RTC 4-5 months or sooner if new or worsening neurologic problems.   Richard A. Epimenio Foot, MD, PhD 09/07/2017, 7:06 PM Certified in Neurology, Clinical Neurophysiology, Sleep Medicine, Pain Medicine and Neuroimaging  [facility] [address] [place], [place] [address] [phone]  -       [facility]  PATIENT: Mary Beck DOB: [birth year]   _________________________________   HISTORICAL  CHIEF COMPLAINT:  Chief Complaint  Patient presents with  . Multiple Sclerosis    Sts. she continues to tolerate Gilenya well.  Has  been having more right hip pain./fim     HISTORY OF PRESENT ILLNESS:  Mary Beck is a 33 -year-old woman who was diagnosed with MS in 2010.      Lower back pain:   She had surgery 05/2016 in [facility].   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   She feels left leg weakness and pain is much better now.    Bladder function also improved.    MS:  She feels stable and has no new exacerbations.   She is on Gilenya and tolerates it well.  She likes it much better than Copaxone or Betaseron that she had been on in the past.    Her 11/10/2015 MRI was stable.     Gait/strength/senation:  Gait is doing well.    Balance is good.   She feels gait improved with surgery.   She has right hand numbness (old) but no weakness or other numbness.   Vision:   She denies any current visual symptoms. However, she did have pseudotumor cerebri about 15 or 20 years ago and had serial LP's and Diamox.  She improved after weight loss.  Bladder/bowel:  Bladder is also much better since surgery.   She is on oxybutynin with benefit.   She has only had a couple episodes of incontinence, both with urge first.  Fatigue/sleep:   This is stable .   She has fatigue, worse with the heat.  Her sleep onset insomnia is much better and she has only taken one Ambien in the last 2 months.   Adderall 20 mg once or twice a day has helped her fatigue during the day  Mood/cognition:  She feels her  mood is doing fairly well in general. She still has mild depression at times. She tolerates  it well. She still notes some difficulties with focus and attention. Adderall has helped this.  MS HIstory:   She was diagnosed in 2010 after presenting with numbness in the feet up to her knees. She had an MRI of the brain that was abnormal. This was followed by a lumbar puncture in the CSF was also abnormal. She was diagnosed with MS and was placed on Copaxone. She had an exacerbation and was switched to Betaseron. Due to needle fatigue she was switched to  Gilenya about 2 years ago. Since being on Gilenya her MS has been stable. She denies any exacerbations. She tolerates Gilenya well. She had an MRI of the brain early 2015 that was unchanged compared to her previous MRI.   MRI May 2017 shows a new large chronic focus in the left frontal lobe not present in 2015. MRI of the cervical spine shows 3 T2 hyperintense foci  MRI Brain 11/10/15 IMPRESSION: This MRI of the brain with and without contrast shows the following: 1. Scattered T2/FLAIR hyperintense foci in the hemispheres and right cerebellum in a pattern and configuration consistent with chronic demyelinating plaque associated with multiple sclerosis. None of the foci appears to be acute. However, when compared to the MRI dated 11/07/2013, one left frontal subcortical focus has occurred during the interim. 2, There are no acute findings.  MRi Cervical 11/10/15 IMPRESSION: This MRI of the cervical spine with and without contrast shows the following: 1. There are three T2 hyperintense foci within the spinal cord as detailed above consistent with chronic demyelinating plaque associated with multiple sclerosis. None of the foci enhances after contrast administration. 2. Mild degenerative changes at C5-C6 and C6-C7 that did not lead to any nerve root impingement. 3. There is a normal enhancement pattern.  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 2. Posterior element fusion anomaly/spina bifida at L3 and below. 3. Advanced facet disease in the lower lumbar spine. 4. 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     REVIEW OF SYSTEMS: Constitutional: No fevers, chills, sweats, or change in appetite.   She reports fatigue Eyes: No visual changes, double vision, .  However, mild eye pain Ear, nose and throat: No  hearing loss, ear pain, nasal congestion, sore throat Cardiovascular: No chest pain, palpitations Respiratory: No shortness of breath at rest or with exertion.   No wheezes GastrointestinaI: No nausea, vomiting, diarrhea, abdominal pain, fecal incontinence Genitourinary: No dysuria or urinary retention.  She notes frequency and nocturia. Musculoskeletal: No neck pain.  Notes back pain Integumentary: No rash, pruritus, skin lesions Neurological: as above.  Also, occasional Headache Psychiatric: Reports mild depression but little anxiety Endocrine: No palpitations, diaphoresis, change in appetite, change in weigh or increased thirst Hematologic/Lymphatic: No anemia, purpura, petechiae. Allergic/Immunologic: No itchy/runny eyes, nasal congestion, recent allergic reactions, rashes  ALLERGIES: No Known Allergies  HOME MEDICATIONS:  Current Outpatient Medications:  .  amphetamine-dextroamphetamine (ADDERALL) 20 MG tablet, Take 1 tablet (20 mg total) by mouth 2 (two) times daily., Disp: 60 tablet, Rfl: 0 .  buPROPion (WELLBUTRIN XL) 300 MG 24 hr tablet, TAKE ONE TABLET BY MOUTH DAILY, Disp: 30 tablet, Rfl: 11 .  cholecalciferol (VITAMIN D) 1000 UNITS tablet, Take 2,000 Units by  mouth daily. Take 1 capsules daily, Disp: , Rfl:  .  DULoxetine (CYMBALTA) 60 MG capsule, TAKE ONE CAPSULE BY MOUTH DAILY, Disp: 60 capsule, Rfl: 6 .  Fingolimod HCl (GILENYA) 0.5 MG CAPS, Take 1 capsule (0.5 mg total) by mouth daily., Disp: 90 capsule, Rfl: 3 .  ibuprofen (ADVIL,MOTRIN) 800 MG tablet, Take 1 tablet (800 mg total) by mouth every 8 (eight) hours as needed for mild pain., Disp: 30 tablet, Rfl: 0 .  loratadine (CLARITIN) 10 MG tablet, Take 10 mg by mouth daily., Disp: , Rfl:  .  oxybutynin (DITROPAN) 5 MG tablet, TAKE ONE TABLET BY MOUTH THREE TIMES A DAY, Disp: 90 tablet, Rfl: 11 .  PROAIR HFA 108 (90 Base) MCG/ACT inhaler, Inhale 2 puffs into the lungs every 6 (six) hours as needed for wheezing or  shortness of breath., Disp: 6.7 g, Rfl: 1 .  etodolac (LODINE) 400 MG tablet, TAKE ONE TABLET BY MOUTH TWICE A DAY (Patient not taking: Reported on 09/07/2017), Disp: 60 tablet, Rfl: 0  PAST MEDICAL HISTORY: Past Medical History:  Diagnosis Date  . Headache   . Multiple sclerosis (HCC)   . Pseudotumor cerebri   . Spina bifida (HCC)     PAST SURGICAL HISTORY: History reviewed. No pertinent surgical history.  FAMILY HISTORY: Family History  Problem Relation Age of Onset  . Multiple sclerosis Neg Hx     SOCIAL HISTORY:  Social History   Socioeconomic History  . Marital status: Single    Spouse name: Not on file  . Number of children: Not on file  . Years of education: Not on file  . Highest education level: Not on file  Social Needs  . Financial resource strain: Not on file  . Food insecurity - worry: Not on file  . Food insecurity - inability: Not on file  . Transportation needs - medical: Not on file  . Transportation needs - non-medical: Not on file  Occupational History  . Not on file  Tobacco Use  . Smoking status: Never Smoker  . Smokeless tobacco: Never Used  Substance and Sexual Activity  . Alcohol use: No    Alcohol/week: 0.0 oz  . Drug use: No  . Sexual activity: Yes    Birth control/protection: None  Other Topics Concern  . Not on file  Social History Narrative  . Not on file     PHYSICAL EXAM  Vitals:   09/07/17 1613  BP: 129/88  Pulse: (!) 111  Resp: 18  Weight: 193 lb 8 oz (87.8 kg)  Height: 5\' 3"  (1.6 m)    Body mass index is 34.28 kg/m.   General: The patient is well-developed and well-nourished and in no acute distress.   There is tenderness over the lower lumbar spine and right piriformis muscle    Neurologic Exam  Mental status: The patient is alert and oriented x 3 at the time of the examination. The patient has apparent normal recent and remote memory, with an apparently normal attention span and concentration ability.    Speech is normal.  Cranial nerves: Extraocular movements are full.    Facial strength and sensation is normal.  Trapezius strength is normal.  L. No dysarthria is noted.    No obvious hearing deficits are noted.  Motor:  Muscle bulk is normal.   Tone is normal. Strength is  5 / 5 in all 4 extremities .   Sensory: She had symmetric sensation in the legs.  Coordination: Cerebellar testing reveals good  finger-nose-finger and heel-to-shin bilaterally.  Gait and station: Station is normal.   The gait is fairly normal with a tandem gait is wide.  De. There is no Romberg sign..    Reflexes: Deep tendon reflexes are normal and symmetric in the arms.  She has increased reflexes in the knees with spread.  The right ankle reflex is reduced compared to the left.       DIAGNOSTIC DATA (LABS, IMAGING, TESTING) - I reviewed patient records, labs, notes, testing and imaging myself where available.     ASSESSMENT AND PLAN  Multiple sclerosis (HCC) - Plan: MR BRAIN W WO CONTRAST, MR CERVICAL SPINE W WO CONTRAST, MR THORACIC SPINE WO CONTRAST, CBC with Differential/Platelet  Spina bifida of lumbosacral region without hydrocephalus (HCC) - Plan: MR THORACIC SPINE WO CONTRAST, MR Lumbar Spine W Wo Contrast  Right-sided low back pain with right-sided sciatica, unspecified chronicity  Synovial cyst of lumbar facet joint  Occult spina bifida  Other fatigue  Urinary frequency  History of lumbar surgery    1.   Continue Gilenya.  We will check a CBC with differential today.   2.   We need to check an MRI of the brain and spine to determine if she is having any subclinical activity or breakthrough disease.  If present, we would need to consider a different disease modifying therapy.   3.     We also need to check an MRI of the lumbar spine with and without contrast due to her new radicular leg symptoms and her complicated surgical history to make sure that there is not recurrent or worsening  degenerative change or other process.  Based on results, we may need to refer her back to neurosurgery. 4.    Renew Adderall  5.    RTC 4-5 months or sooner if new or worsening neurologic problems.   Richard A. Epimenio Foot, MD, PhD 09/07/2017, 7:06 PM Certified in Neurology, Clinical Neurophysiology, Sleep Medicine, Pain Medicine and Neuroimaging  [facility] [address], [address] [place], [place] [address] [phone]  -

## 2017-09-08 ENCOUNTER — Telehealth: Payer: Self-pay | Admitting: Neurology

## 2017-09-08 LAB — CBC WITH DIFFERENTIAL/PLATELET
BASOS: 0 %
Basophils Absolute: 0 10*3/uL (ref 0.0–0.2)
EOS (ABSOLUTE): 0 10*3/uL (ref 0.0–0.4)
EOS: 1 %
Hematocrit: 38.5 % (ref 34.0–46.6)
Hemoglobin: 12.5 g/dL (ref 11.1–15.9)
Immature Grans (Abs): 0 10*3/uL (ref 0.0–0.1)
Immature Granulocytes: 0 %
LYMPHS ABS: 0.5 10*3/uL — AB (ref 0.7–3.1)
Lymphs: 11 %
MCH: 29.3 pg (ref 26.6–33.0)
MCHC: 32.5 g/dL (ref 31.5–35.7)
MCV: 90 fL (ref 79–97)
MONOS ABS: 0.5 10*3/uL (ref 0.1–0.9)
Monocytes: 11 %
Neutrophils Absolute: 3.3 10*3/uL (ref 1.4–7.0)
Neutrophils: 77 %
PLATELETS: 318 10*3/uL (ref 150–379)
RBC: 4.26 x10E6/uL (ref 3.77–5.28)
RDW: 14.9 % (ref 12.3–15.4)
WBC: 4.3 10*3/uL (ref 3.4–10.8)

## 2017-09-08 NOTE — Telephone Encounter (Signed)
MRI Brain auth: [id number] (exp. 10/23/17) Cervical auth: [id number] (exp. 10/23/17) Thoracic auth: [id number] (exp. 10/23/17) . The Lumbar is pending I faxed clinical notes to nurse reviewer.

## 2017-09-09 ENCOUNTER — Telehealth: Payer: Self-pay | Admitting: *Deleted

## 2017-09-09 NOTE — Telephone Encounter (Signed)
-----   Message from Asa Lente, MD sent at 09/09/2017 10:31 AM EDT ----- Please let the patient know that the lab work is ok --- the lymphocytes are low but that is expected with Gilenya.

## 2017-09-09 NOTE — Telephone Encounter (Signed)
I left a detailed message with lab results, ok per dpr. I advised patient to call back with any questions or concerns.   

## 2017-09-09 NOTE — Telephone Encounter (Signed)
Called and spoke with patient. Advised labs okay, lymphocyte count low but that is to be expected on Gilenya. She verbalized understanding.  She questioned about scheduling MRIs. I advised orders sent to [facility]. She can call to schedule if she would like. She asked if she can go somewhere close to work. She works in [employer]. I advised [facility] has extended hours. She is still interested in going somewhere in [address] if possible. She would like to speak with Irving Burton E. Next week about this. Advised I wills send her a message. She verbalized understanding.

## 2017-09-13 NOTE — Telephone Encounter (Signed)
Left a voicemail for pt to call back.

## 2017-10-09 ENCOUNTER — Ambulatory Visit
Admission: RE | Admit: 2017-10-09 | Discharge: 2017-10-09 | Disposition: A | Discharge status: Home / Self Care | Payer: 59 | Source: Ambulatory Visit | Attending: Neurology | Admitting: Neurology

## 2017-10-09 DIAGNOSIS — G35 Multiple sclerosis: Secondary | ICD-10-CM

## 2017-10-09 MED ORDER — GADOBENATE DIMEGLUMINE 529 MG/ML IV SOLN
18.0000 mL | Freq: Once | INTRAVENOUS | Status: AC | PRN
  Administered 2017-10-09: 18 mL via INTRAVENOUS

## 2017-10-12 ENCOUNTER — Ambulatory Visit
Admission: RE | Admit: 2017-10-12 | Discharge: 2017-10-12 | Disposition: A | Discharge status: Home / Self Care | Payer: BLUE CROSS/BLUE SHIELD | Source: Ambulatory Visit | Attending: Neurology | Admitting: Neurology

## 2017-10-12 DIAGNOSIS — Q057 Lumbar spina bifida without hydrocephalus: Secondary | ICD-10-CM

## 2017-10-12 DIAGNOSIS — G35 Multiple sclerosis: Secondary | ICD-10-CM | POA: Diagnosis not present

## 2017-10-12 MED ORDER — GADOBENATE DIMEGLUMINE 529 MG/ML IV SOLN
17.0000 mL | Freq: Once | INTRAVENOUS | Status: AC | PRN
  Administered 2017-10-12: 17 mL via INTRAVENOUS

## 2017-10-14 ENCOUNTER — Telehealth: Payer: Self-pay | Admitting: Neurology

## 2017-10-14 NOTE — Telephone Encounter (Signed)
I spoke to [facility] about the MRI of the thoracic and lumbar spine.  She does have to MS plaques in the thoracic spine and they are likely responsible for her leg weakness.  She also has a significant synovial cyst at L5-S1 that displaces the right S1 nerve root and likely explains her right-sided sciatic pain.  There is also postoperative changes with mild epidural fibrosis.  I cannot rule out that the epidural fibrosis also play some role in her pain.  We discussed that if the pain becomes much worse we would need to consider having her see surgery again for the L5-S1 level

## 2017-11-18 ENCOUNTER — Other Ambulatory Visit: Payer: Self-pay | Admitting: Neurology

## 2017-11-18 MED ORDER — AMPHETAMINE-DEXTROAMPHETAMINE 20 MG PO TABS: 20.0000 mg | ORAL_TABLET | Freq: Two times a day (BID) | ORAL | 0 refills | Status: DC

## 2017-11-18 NOTE — Telephone Encounter (Signed)
Pt requesting a refill for amphetamine-dextroamphetamine (ADDERALL) 20 MG tablet sent to [facility]

## 2017-11-18 NOTE — Telephone Encounter (Signed)
Patient is current on appointments.  [place] narcotic registry checked without issues noted. Prescription is due and will be sent directly to her requested pharmacy by MD.

## 2017-12-05 ENCOUNTER — Other Ambulatory Visit: Payer: Self-pay | Admitting: Neurology

## 2017-12-19 ENCOUNTER — Other Ambulatory Visit: Payer: Self-pay | Admitting: Neurology

## 2018-02-13 ENCOUNTER — Other Ambulatory Visit: Payer: Self-pay | Admitting: Neurology

## 2018-02-13 MED ORDER — AMPHETAMINE-DEXTROAMPHETAMINE 20 MG PO TABS: 20.0000 mg | ORAL_TABLET | Freq: Two times a day (BID) | ORAL | 0 refills | Status: DC

## 2018-02-13 NOTE — Telephone Encounter (Signed)
Pt request refill for amphetamine-dextroamphetamine (ADDERALL) 20 MG tablet and ambien sent to Karin Golden at [facility] [address], [place] - [address] [facility].

## 2018-02-16 ENCOUNTER — Telehealth: Payer: Self-pay | Admitting: Neurology

## 2018-02-16 MED ORDER — ZOLPIDEM TARTRATE 5 MG PO TABS: 5.0000 mg | ORAL_TABLET | Freq: Every evening | ORAL | 0 refills | Status: DC | PRN

## 2018-02-16 NOTE — Telephone Encounter (Signed)
At last ov, pt. was sleeping better, didn't need Ambien.  Now would like to have it again for prn use.  Per v/o RAS, rx. escribed to [facility]

## 2018-02-16 NOTE — Telephone Encounter (Signed)
Pt requesting a refill for Ambien sent to [facility].

## 2018-03-28 ENCOUNTER — Other Ambulatory Visit: Payer: Self-pay | Admitting: Neurology

## 2018-03-28 MED ORDER — ZOLPIDEM TARTRATE 5 MG PO TABS: 5.0000 mg | ORAL_TABLET | Freq: Every evening | ORAL | 0 refills | Status: DC | PRN

## 2018-03-28 MED ORDER — AMPHETAMINE-DEXTROAMPHETAMINE 20 MG PO TABS: 20.0000 mg | ORAL_TABLET | Freq: Two times a day (BID) | ORAL | 0 refills | Status: DC

## 2018-03-28 NOTE — Telephone Encounter (Signed)
Pt request refills for amphetamine-dextroamphetamine (ADDERALL) 20 MG tablet and zolpidem (AMBIEN) 5 MG tablet sent to Karin Golden at [facility] [address], [place] - [address] [facility]

## 2018-06-06 ENCOUNTER — Other Ambulatory Visit: Payer: Self-pay | Admitting: Neurology

## 2018-06-08 ENCOUNTER — Other Ambulatory Visit: Payer: Self-pay | Admitting: Neurology

## 2018-06-09 ENCOUNTER — Other Ambulatory Visit: Payer: Self-pay | Admitting: Neurology

## 2018-06-09 MED ORDER — AMPHETAMINE-DEXTROAMPHETAMINE 20 MG PO TABS: 20.0000 mg | ORAL_TABLET | Freq: Two times a day (BID) | ORAL | 0 refills | Status: DC

## 2018-06-09 MED ORDER — ZOLPIDEM TARTRATE 5 MG PO TABS: 5.0000 mg | ORAL_TABLET | Freq: Every evening | ORAL | 0 refills | Status: DC | PRN

## 2018-06-09 NOTE — Telephone Encounter (Signed)
Spoke with Dr. Epimenio FootSater- ok to refill this time. Need to schedule follow in January to continue receiving refills.

## 2018-06-09 NOTE — Telephone Encounter (Signed)
Checked drug registry. She last refilled both on 03/30/18 and not receiving from other MD's. Last seen 09/07/17 and no pending f/u. Will need to speak with Dr. Epimenio Foot

## 2018-06-09 NOTE — Telephone Encounter (Signed)
Pt requesting refills for amphetamine-dextroamphetamine (ADDERALL) 20 MG tablet and zolpidem (AMBIEN) 5 MG tablet [facility]

## 2018-06-09 NOTE — Telephone Encounter (Signed)
Called pt. Scheduled f/u for 07/17/17 at 9am. Pt verbalized understanding and aware she needs to f/u to continue receiving refills. Advised I will send refills requests to Dr. Epimenio FootSater.

## 2018-07-10 ENCOUNTER — Other Ambulatory Visit: Payer: Self-pay | Admitting: Neurology

## 2018-07-17 ENCOUNTER — Ambulatory Visit: Payer: PRIVATE HEALTH INSURANCE | Admitting: Neurology

## 2018-07-17 ENCOUNTER — Encounter: Payer: Self-pay | Admitting: Neurology

## 2018-07-17 ENCOUNTER — Telehealth: Payer: Self-pay | Admitting: *Deleted

## 2018-07-17 VITALS — BP 125/79 | HR 92 | Ht 63.0 in | Wt 200.0 lb

## 2018-07-17 DIAGNOSIS — R350 Frequency of micturition: Secondary | ICD-10-CM | POA: Diagnosis not present

## 2018-07-17 DIAGNOSIS — G35 Multiple sclerosis: Secondary | ICD-10-CM | POA: Diagnosis not present

## 2018-07-17 DIAGNOSIS — Q760 Spina bifida occulta: Secondary | ICD-10-CM

## 2018-07-17 DIAGNOSIS — R5383 Other fatigue: Secondary | ICD-10-CM

## 2018-07-17 DIAGNOSIS — M7138 Other bursal cyst, other site: Secondary | ICD-10-CM

## 2018-07-17 MED ORDER — AMPHETAMINE-DEXTROAMPHETAMINE 20 MG PO TABS: 20.0000 mg | ORAL_TABLET | Freq: Two times a day (BID) | ORAL | 0 refills | Status: DC

## 2018-07-17 MED ORDER — ZOLPIDEM TARTRATE 5 MG PO TABS: 5.0000 mg | ORAL_TABLET | Freq: Every evening | ORAL | 5 refills | Status: DC | PRN

## 2018-07-17 MED ORDER — MIRABEGRON ER 50 MG PO TB24: 50.0000 mg | ORAL_TABLET | Freq: Every day | ORAL | 11 refills | Status: DC

## 2018-07-17 MED ORDER — FINGOLIMOD HCL 0.5 MG PO CAPS: 1.0000 | ORAL_CAPSULE | Freq: Every day | ORAL | 3 refills | Status: DC

## 2018-07-17 NOTE — Progress Notes (Signed)
[facility]  PATIENT: Mary Beck DOB: 1975/11/06   _________________________________   HISTORICAL  CHIEF COMPLAINT:  Chief Complaint  Patient presents with  . Follow-up    RM 12, alone. Last seen 08/2017. Denies any new sx.  . Multiple Sclerosis    On Gilenya, tolerating well.. Needs refills [facility].    HISTORY OF PRESENT ILLNESS:  Mary Beck is a 43 y.o. woman who was diagnosed with MS in 2010.    Update  07/17/2018: She is on Gilenya and tolerates it well.  No exacerbations.  No new MS symptoms.    Her gait is doing well.  MRI of the brain  09/2017 showed no new lesions.  She has two lesions in T-spine that looked chronicShe has no trouble with stairs    She denies numbness or weakness in legs.    Vision is doing well and no color desaturation.    She has urinary frequency and some urge incontinence.     Ditropan helps some but not as much at night.   It makes her mouth dry if she takes 2 so most days she just takes one.   Fatigue is a problem and she does fewer activities because of it.  She is still working IT trainer, order processing, lab sample coordination).      She is using more Ambien to help her fall asleep (5 mg 3-4 times a week).   Cognition is ok, with improved focus and attention on Adderall.    Mood is ok.  She is on Cymbalta.     She had decompression for a tethered cord  Update 09/07/2017:    She notes a pinching sensation in the right buttock/lower back that radiates some into the right leg to mid-thigh, worsening the last couple months.   Standing a long time or walking increases the pain.  She notes the leg will give out when the pain is worse but not otherwise.    She does not note numbness in the leg.    She had surgery for a tethered cord, diastematomyelia and spina bifida    She also had a right L5-S1 synovial cyst resection and L2L3-L5S1 decompressive laminectomy  She feels her MS is mostly stable.   She is on  Gilenya and she tolerates it well.  Her gait is sometimes a little off balance but she has no recent falls.  Her bladder has done better since her lumbar surgery.   Vision is doing well.    Her fatigue is a problem.   She is sleeping ok most nights and no longer uses Ambien.     Mood is reduced with mild depression.  Cognition is fine.      From 03/09/2017 Lower back pain:   She had surgery 05/2016 in [facility].   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   She feels left leg weakness and pain is much better now.    Bladder function also improved.    MS:  She feels stable and has no new exacerbations.   She is on Gilenya and tolerates it well.  She likes it much better than Copaxone or Betaseron that she had been on in the past.    Her 11/10/2015 MRI was stable.     Gait/strength/senation:  Gait is doing well.    Balance is good.   She feels gait improved with surgery.   She has right hand numbness (old) but  no weakness or other numbness.   Vision:   She denies any current visual symptoms. However, she did have pseudotumor cerebri about 15 or 20 years ago and had serial LP's and Diamox.  She improved after weight loss.  Bladder/bowel:  Bladder is also much better since surgery.   She is on oxybutynin with benefit.   She has only had a couple episodes of incontinence, both with urge first.  Fatigue/sleep:   This is stable .   She has fatigue, worse with the heat.  Her sleep onset insomnia is much better and she has only taken one Ambien in the last 2 months.   Adderall 20 mg once or twice a day has helped her fatigue during the day  Mood/cognition:  She feels her mood is doing fairly well in general. She still has mild depression at times. She tolerates  it well. She still notes some difficulties with focus and attention. Adderall has helped this.  MS HIstory:   She was diagnosed in 2010 after presenting with numbness in the feet up to her knees. She had an MRI of the brain  that was abnormal. This was followed by a lumbar puncture in the CSF was also abnormal. She was diagnosed with MS and was placed on Copaxone. She had an exacerbation and was switched to Betaseron. Due to needle fatigue she was switched to Gilenya about 2 years ago. Since being on Gilenya her MS has been stable. She denies any exacerbations. She tolerates Gilenya well. She had an MRI of the brain early 2015 that was unchanged compared to her previous MRI.   MRI May 2017 shows a new large chronic focus in the left frontal lobe not present in 2015. MRI of the cervical spine shows 3 T2 hyperintense foci  MRI Brain 11/10/15 IMPRESSION: This MRI of the brain with and without contrast shows the following: 1. Scattered T2/FLAIR hyperintense foci in the hemispheres and right cerebellum in a pattern and configuration consistent with chronic demyelinating plaque associated with multiple sclerosis. None of the foci appears to be acute. However, when compared to the MRI dated 11/07/2013, one left frontal subcortical focus has occurred during the interim. 2, There are no acute findings.  MRi Cervical 11/10/15 IMPRESSION: This MRI of the cervical spine with and without contrast shows the following: 1. There are three T2 hyperintense foci within the spinal cord as detailed above consistent with chronic demyelinating plaque associated with multiple sclerosis. None of the foci enhances after contrast administration. 2. Mild degenerative changes at C5-C6 and C6-C7 that did not lead to any nerve root impingement. 3. There is a normal enhancement pattern.  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 2. Posterior element fusion anomaly/spina bifida at L3 and below. 3. Advanced facet disease in the lower lumbar spine. 4. 14 mm right-sided synovial cyst with mass effect on the thecal sac  and the right S1 nerve root.     REVIEW OF SYSTEMS: Constitutional: No fevers, chills, sweats, or change in appetite.   She reports fatigue Eyes: No visual changes, double vision, .  However, mild eye pain Ear, nose and throat: No hearing loss, ear pain, nasal congestion, sore throat Cardiovascular: No chest pain, palpitations Respiratory: No shortness of breath at rest or with exertion.   No wheezes GastrointestinaI: No nausea, vomiting, diarrhea, abdominal pain, fecal incontinence Genitourinary: No dysuria or urinary  retention.  She notes frequency and nocturia. Musculoskeletal: No neck pain.  Notes back pain Integumentary: No rash, pruritus, skin lesions Neurological: as above.  Also, occasional Headache Psychiatric: Reports mild depression but little anxiety Endocrine: No palpitations, diaphoresis, change in appetite, change in weigh or increased thirst Hematologic/Lymphatic: No anemia, purpura, petechiae. Allergic/Immunologic: No itchy/runny eyes, nasal congestion, recent allergic reactions, rashes  ALLERGIES: No Known Allergies  HOME MEDICATIONS:  Current Outpatient Medications:  .  amphetamine-dextroamphetamine (ADDERALL) 20 MG tablet, Take 1 tablet (20 mg total) by mouth 2 (two) times daily., Disp: 60 tablet, Rfl: 0 .  buPROPion (WELLBUTRIN XL) 300 MG 24 hr tablet, TAKE ONE TABLET BY MOUTH DAILY, Disp: 30 tablet, Rfl: 0 .  cholecalciferol (VITAMIN D) 1000 UNITS tablet, Take 2,000 Units by mouth daily. Take 1 capsules daily, Disp: , Rfl:  .  DULoxetine (CYMBALTA) 60 MG capsule, TAKE ONE CAPSULE BY MOUTH DAILY, Disp: 30 capsule, Rfl: 1 .  etodolac (LODINE) 400 MG tablet, TAKE ONE TABLET BY MOUTH TWICE A DAY, Disp: 60 tablet, Rfl: 0 .  Fingolimod HCl (GILENYA) 0.5 MG CAPS, Take 1 capsule (0.5 mg total) by mouth daily., Disp: 90 capsule, Rfl: 3 .  ibuprofen (ADVIL,MOTRIN) 800 MG tablet, Take 1 tablet (800 mg total) by mouth every 8 (eight) hours as needed for mild pain.,  Disp: 30 tablet, Rfl: 0 .  loratadine (CLARITIN) 10 MG tablet, Take 10 mg by mouth daily., Disp: , Rfl:  .  oxybutynin (DITROPAN) 5 MG tablet, TAKE ONE TABLET BY MOUTH THREE TIMES A DAY, Disp: 270 tablet, Rfl: 1 .  PROAIR HFA 108 (90 Base) MCG/ACT inhaler, Inhale 2 puffs into the lungs every 6 (six) hours as needed for wheezing or shortness of breath., Disp: 6.7 g, Rfl: 1 .  zolpidem (AMBIEN) 5 MG tablet, Take 1 tablet (5 mg total) by mouth at bedtime as needed for sleep., Disp: 30 tablet, Rfl: 5 .  mirabegron ER (MYRBETRIQ) 50 MG TB24 tablet, Take 1 tablet (50 mg total) by mouth daily., Disp: 30 tablet, Rfl: 11  PAST MEDICAL HISTORY: Past Medical History:  Diagnosis Date  . Headache   . Multiple sclerosis (HCC)   . Pseudotumor cerebri   . Spina bifida (HCC)     PAST SURGICAL HISTORY: History reviewed. No pertinent surgical history.  FAMILY HISTORY: Family History  Problem Relation Age of Onset  . Multiple sclerosis Neg Hx     SOCIAL HISTORY:  Social History   Socioeconomic History  . Marital status: Single    Spouse name: Not on file  . Number of children: Not on file  . Years of education: Not on file  . Highest education level: Not on file  Occupational History  . Not on file  Social Needs  . Financial resource strain: Not on file  . Food insecurity:    Worry: Not on file    Inability: Not on file  . Transportation needs:    Medical: Not on file    Non-medical: Not on file  Tobacco Use  . Smoking status: Never Smoker  . Smokeless tobacco: Never Used  Substance and Sexual Activity  . Alcohol use: No    Alcohol/week: 0.0 standard drinks  . Drug use: No  . Sexual activity: Yes    Birth control/protection: None  Lifestyle  . Physical activity:    Days per week: Not on file    Minutes per session: Not on file  . Stress: Not on file  Relationships  . Social  connections:    Talks on phone: Not on file    Gets together: Not on file    Attends religious  service: Not on file    Active member of club or organization: Not on file    Attends meetings of clubs or organizations: Not on file    Relationship status: Not on file  . Intimate partner violence:    Fear of current or ex partner: Not on file    Emotionally abused: Not on file    Physically abused: Not on file    Forced sexual activity: Not on file  Other Topics Concern  . Not on file  Social History Narrative  . Not on file     PHYSICAL EXAM  Vitals:   07/17/18 0917  BP: 125/79  Pulse: 92  Weight: 200 lb (90.7 kg)  Height: 5\' 3"  (1.6 m)    Body mass index is 35.43 kg/m.   General: The patient is well-developed and well-nourished and in no acute distress    Neurologic Exam  Mental status: The patient is alert and oriented x 3 at the time of the examination. The patient has apparent normal recent and remote memory, with an apparently normal attention span and concentration ability.   Speech is normal.  Cranial nerves: Extraocular movements are full.     Facial symmetry is present. There is good facial sensation to soft touch bilaterally.Facial strength is normal.  Trapezius and sternocleidomastoid strength is normal. No dysarthria is noted.    No obvious hearing deficits are noted.  Motor:  Muscle bulk is normal.   Tone is normal. Strength is  5 / 5 in all 4 extremities .   Sensory: She has mildly reduced touch and vibration sensation in the right hand compared to the left hand. Leg sensation was normal.   Coordination: Cerebellar testing reveals good finger-nose-finger and heel-to-shin bilaterally.  Gait and station: Station is normal.   The gait is normal and the tandem gait is minimally wide. There is no Romberg sign..    Reflexes: Deep tendon reflexes are normal and symmetric in the arms. Reflexes were increased in the knees, spread on the left.. Her right  ankle DTR reduced relative to left.    DIAGNOSTIC DATA (LABS, IMAGING, TESTING) - I reviewed patient  records, labs, notes, testing and imaging myself where available.     ASSESSMENT AND PLAN  MS (multiple sclerosis) (HCC)  Occult spina bifida  Synovial cyst of lumbar facet joint  Urinary frequency  Other fatigue    1.   She will continue on Gilenya. Her MS has been mostly stable. We will check some lab work to make sure that there is not severe lymphopenia or any hepatotoxicity. Sometime next year we will recheck the MRI of the brain to determine if any subclinical progression is occurring. If present, other DMT's will be discussed.  2.    Continue to be active and exercises as tolerated.  3.    Continue gabapentin and and etodolac (change to prn now). 4.    Renew Adderall  5.    RTC 4-5 months or sooner if new or worsening neurologic problems.   Ebunoluwa Gernert A. Epimenio Foot, MD, PhD 07/17/2018, 9:46 AM Certified in Neurology, Clinical Neurophysiology, Sleep Medicine, Pain Medicine and Neuroimaging  [facility] [address] [place], [place] [address] [phone]  -       [facility]  PATIENT: AMILIAN JOVIC DOB: Apr 04, 1976   _________________________________  HISTORICAL  CHIEF COMPLAINT:  Chief Complaint  Patient presents with  . Follow-up    RM 12, alone. Last seen 08/2017. Denies any new sx.  . Multiple Sclerosis    On Gilenya, tolerating well.. Needs [facility]ills [facility].    HISTORY OF PRESENT ILLNESS:  Jasper Rilingmily Burchette is a 43 -year-old woman who was diagnosed with MS in 2010.      Lower back pain:   She had surgery 05/2016 in [facility].   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   She feels left leg weakness and pain is much better now.    Bladder function also improved.    MS:  She feels stable and has no new exacerbations.   She is on Gilenya and tolerates it well.  She likes it much better than Copaxone or Betaseron that she had been on in the past.    Her 11/10/2015 MRI was  stable.     Gait/strength/senation:  Gait is doing well.    Balance is good.   She feels gait improved with surgery.   She has right hand numbness (old) but no weakness or other numbness.   Vision:   She denies any current visual symptoms. However, she did have pseudotumor cerebri about 15 or 20 years ago and had serial LP's and Diamox.  She improved after weight loss.  Bladder/bowel:  Bladder is also much better since surgery.   She is on oxybutynin with benefit.   She has only had a couple episodes of incontinence, both with urge first.  Fatigue/sleep:   This is stable .   She has fatigue, worse with the heat.  Her sleep onset insomnia is much better and she has only taken one Ambien in the last 2 months.   Adderall 20 mg once or twice a day has helped her fatigue during the day  Mood/cognition:  She feels her mood is doing fairly well in general. She still has mild depression at times. She tolerates  it well. She still notes some difficulties with focus and attention. Adderall has helped this.  MS HIstory:   She was diagnosed in 2010 after presenting with numbness in the feet up to her knees. She had an MRI of the brain that was abnormal. This was followed by a lumbar puncture in the CSF was also abnormal. She was diagnosed with MS and was placed on Copaxone. She had an exacerbation and was switched to Betaseron. Due to needle fatigue she was switched to Gilenya about 2 years ago. Since being on Gilenya her MS has been stable. She denies any exacerbations. She tolerates Gilenya well. She had an MRI of the brain early 2015 that was unchanged compared to her previous MRI.   MRI May 2017 shows a new large chronic focus in the left frontal lobe not present in 2015. MRI of the cervical spine shows 3 T2 hyperintense foci  MRI Brain 11/10/15 IMPRESSION: This MRI of the brain with and without contrast shows the following: 1. Scattered T2/FLAIR hyperintense foci in the hemispheres and right cerebellum  in a pattern and configuration consistent with chronic demyelinating plaque associated with multiple sclerosis. None of the foci appears to be acute. However, when compared to the MRI dated 11/07/2013, one left frontal subcortical focus has occurred during the interim. 2, There are no acute findings.  MRi Cervical 11/10/15 IMPRESSION: This MRI of the cervical spine with and without contrast shows the following: 1. There are  three T2 hyperintense foci within the spinal cord as detailed above consistent with chronic demyelinating plaque associated with multiple sclerosis. None of the foci enhances after contrast administration. 2. Mild degenerative changes at C5-C6 and C6-C7 that did not lead to any nerve root impingement. 3. There is a normal enhancement pattern.  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 2. Posterior element fusion anomaly/spina bifida at L3 and below. 3. Advanced facet disease in the lower lumbar spine. 4. 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     REVIEW OF SYSTEMS: Constitutional: No fevers, chills, sweats, or change in appetite.   She reports fatigue Eyes: No visual changes, double vision, .  However, mild eye pain Ear, nose and throat: No hearing loss, ear pain, nasal congestion, sore throat Cardiovascular: No chest pain, palpitations Respiratory: No shortness of breath at rest or with exertion.   No wheezes GastrointestinaI: No nausea, vomiting, diarrhea, abdominal pain, fecal incontinence Genitourinary: No dysuria or urinary retention.  She notes frequency and nocturia. Musculoskeletal: No neck pain.  Notes back pain Integumentary: No rash, pruritus, skin lesions Neurological: as above.  Also, occasional Headache Psychiatric: Reports mild depression but little  anxiety Endocrine: No palpitations, diaphoresis, change in appetite, change in weigh or increased thirst Hematologic/Lymphatic: No anemia, purpura, petechiae. Allergic/Immunologic: No itchy/runny eyes, nasal congestion, recent allergic reactions, rashes  ALLERGIES: No Known Allergies  HOME MEDICATIONS:  Current Outpatient Medications:  .  amphetamine-dextroamphetamine (ADDERALL) 20 MG tablet, Take 1 tablet (20 mg total) by mouth 2 (two) times daily., Disp: 60 tablet, Rfl: 0 .  buPROPion (WELLBUTRIN XL) 300 MG 24 hr tablet, TAKE ONE TABLET BY MOUTH DAILY, Disp: 30 tablet, Rfl: 0 .  cholecalciferol (VITAMIN D) 1000 UNITS tablet, Take 2,000 Units by mouth daily. Take 1 capsules daily, Disp: , Rfl:  .  DULoxetine (CYMBALTA) 60 MG capsule, TAKE ONE CAPSULE BY MOUTH DAILY, Disp: 30 capsule, Rfl: 1 .  etodolac (LODINE) 400 MG tablet, TAKE ONE TABLET BY MOUTH TWICE A DAY, Disp: 60 tablet, Rfl: 0 .  Fingolimod HCl (GILENYA) 0.5 MG CAPS, Take 1 capsule (0.5 mg total) by mouth daily., Disp: 90 capsule, Rfl: 3 .  ibuprofen (ADVIL,MOTRIN) 800 MG tablet, Take 1 tablet (800 mg total) by mouth every 8 (eight) hours as needed for mild pain., Disp: 30 tablet, Rfl: 0 .  loratadine (CLARITIN) 10 MG tablet, Take 10 mg by mouth daily., Disp: , Rfl:  .  oxybutynin (DITROPAN) 5 MG tablet, TAKE ONE TABLET BY MOUTH THREE TIMES A DAY, Disp: 270 tablet, Rfl: 1 .  PROAIR HFA 108 (90 Base) MCG/ACT inhaler, Inhale 2 puffs into the lungs every 6 (six) hours as needed for wheezing or shortness of breath., Disp: 6.7 g, Rfl: 1 .  zolpidem (AMBIEN) 5 MG tablet, Take 1 tablet (5 mg total) by mouth at bedtime as needed for sleep., Disp: 30 tablet, Rfl: 5 .  mirabegron ER (MYRBETRIQ) 50 MG TB24 tablet, Take 1 tablet (50 mg total) by mouth daily., Disp: 30 tablet, Rfl: 11  PAST MEDICAL HISTORY: Past Medical History:  Diagnosis Date  . Headache   . Multiple sclerosis (HCC)   . Pseudotumor cerebri   . Spina bifida (HCC)      PAST SURGICAL HISTORY: History reviewed. No pertinent surgical history.  FAMILY HISTORY: Family History  Problem Relation Age of Onset  . Multiple sclerosis Neg Hx     SOCIAL HISTORY:  Social History   Socioeconomic History  . Marital status: Single    Spouse name: Not on file  . Number of children: Not on file  . Years of education: Not on file  . Highest education level: Not on file  Occupational History  . Not on file  Social Needs  . Financial resource strain: Not on file  . Food insecurity:    Worry: Not on file    Inability: Not on file  . Transportation needs:    Medical: Not on file    Non-medical: Not on file  Tobacco Use  . Smoking status: Never Smoker  . Smokeless tobacco: Never Used  Substance and Sexual Activity  . Alcohol use: No    Alcohol/week: 0.0 standard drinks  . Drug use: No  . Sexual activity: Yes    Birth control/protection: None  Lifestyle  . Physical activity:    Days per week: Not on file    Minutes per session: Not on file  . Stress: Not on file  Relationships  . Social connections:    Talks on phone: Not on file    Gets together: Not on file    Attends religious service: Not on file    Active member of club or organization: Not on file    Attends meetings of clubs or organizations: Not on file    Relationship status: Not on file  . Intimate partner violence:    Fear of current or ex partner: Not on file    Emotionally abused: Not on file    Physically abused: Not on file    Forced sexual activity: Not on file  Other Topics Concern  . Not on file  Social History Narrative  . Not on file     PHYSICAL EXAM  Vitals:   07/17/18 0917  BP: 125/79  Pulse: 92  Weight: 200 lb (90.7 kg)  Height: 5\' 3"  (1.6 m)    Body mass index is 35.43 kg/m.   General: The patient is well-developed and well-nourished and in no acute distress.   There is tenderness over the lower lumbar spine and right piriformis muscle     Neurologic Exam  Mental status: The patient is alert and oriented x 3 at the time of the examination. The patient has apparent normal recent and remote memory, with an apparently normal attention span and concentration ability.   Speech is normal.  Cranial nerves: Extraocular movements are full.    Facial strength and sensation is normal.  Trapezius strength is normal.  L. No dysarthria is noted.    No obvious hearing deficits are noted.  Motor:  Muscle bulk is normal.   Tone is normal. Strength is  5 / 5 in all 4 extremities .   Sensory: She had symmetric sensation in the legs.  Coordination: Cerebellar testing reveals good finger-nose-finger and heel-to-shin bilaterally.  Gait and station: Station is normal.   The gait is fairly normal with a tandem gait is wide.  De. There is no Romberg sign..    Reflexes: Deep tendon reflexes are normal and symmetric in the arms.  She has increased reflexes in the knees with spread.  The right ankle reflex is reduced compared to the left.       DIAGNOSTIC DATA (LABS, IMAGING, TESTING) - I reviewed patient records, labs, notes, testing and imaging myself where available.  ASSESSMENT AND PLAN  MS (multiple sclerosis) (HCC)  Occult spina bifida  Synovial cyst of lumbar facet joint  Urinary frequency  Other fatigue    1.   Continue Gilenya.  We will check a CBC with differential today.   2.   Myrbetriq 50 mg for bladder function.   Consider changing oxybutynin to Vesicare if combination is necessary due to less dry mouth on Vesicare.   If these do not help, refer to urology 3.   Stay active and exercise  4.    Continue Adderall  5.    Mood is better and she can taper off Duloxetine.   If she remains well, consider stopping Wellbutrin in a few months. RTC 4-5 months or sooner if new or worsening neurologic problems.   Temari Schooler A. Epimenio Foot, MD, PhD 07/17/2018, 9:46 AM Certified in Neurology, Clinical Neurophysiology, Sleep Medicine, Pain  Medicine and Neuroimaging  [facility] [address], [address] [place], [place] [address] [phone]  -

## 2018-07-17 NOTE — Telephone Encounter (Signed)
Initiated PA Myrbetriq 50mg  ER on covermymeds. [facility] - Rx #: [id number]. Received the following response:  "[facility] is processing your PA request and will respond shortly with next steps. If you need assistance, please chat with CoverMyMeds or call us at [phone]."

## 2018-07-18 ENCOUNTER — Telehealth: Payer: Self-pay | Admitting: *Deleted

## 2018-07-18 LAB — CBC WITH DIFFERENTIAL/PLATELET
Basophils Absolute: 0 10*3/uL (ref 0.0–0.2)
Basos: 0 %
EOS (ABSOLUTE): 0 10*3/uL (ref 0.0–0.4)
EOS: 1 %
HEMATOCRIT: 38.7 % (ref 34.0–46.6)
Hemoglobin: 12.4 g/dL (ref 11.1–15.9)
Immature Grans (Abs): 0 10*3/uL (ref 0.0–0.1)
Immature Granulocytes: 0 %
LYMPHS ABS: 0.2 10*3/uL — AB (ref 0.7–3.1)
Lymphs: 6 %
MCH: 28.8 pg (ref 26.6–33.0)
MCHC: 32 g/dL (ref 31.5–35.7)
MCV: 90 fL (ref 79–97)
MONOS ABS: 0.5 10*3/uL (ref 0.1–0.9)
Monocytes: 14 %
Neutrophils Absolute: 2.7 10*3/uL (ref 1.4–7.0)
Neutrophils: 79 %
Platelets: 321 10*3/uL (ref 150–450)
RBC: 4.3 x10E6/uL (ref 3.77–5.28)
RDW: 13.6 % (ref 11.7–15.4)
WBC: 3.4 10*3/uL (ref 3.4–10.8)

## 2018-07-18 LAB — COMPREHENSIVE METABOLIC PANEL WITH GFR
ALT: 12 IU/L (ref 0–32)
AST: 16 IU/L (ref 0–40)
Albumin/Globulin Ratio: 1.5 (ref 1.2–2.2)
Albumin: 3.9 g/dL (ref 3.8–4.8)
Alkaline Phosphatase: 58 IU/L (ref 39–117)
BUN/Creatinine Ratio: 19 (ref 9–23)
BUN: 16 mg/dL (ref 6–24)
Bilirubin Total: 0.4 mg/dL (ref 0.0–1.2)
CO2: 26 mmol/L (ref 20–29)
Calcium: 9.1 mg/dL (ref 8.7–10.2)
Chloride: 104 mmol/L (ref 96–106)
Creatinine, Ser: 0.83 mg/dL (ref 0.57–1.00)
GFR calc Af Amer: 99 mL/min/1.73
GFR calc non Af Amer: 86 mL/min/1.73
Globulin, Total: 2.6 g/dL (ref 1.5–4.5)
Glucose: 84 mg/dL (ref 65–99)
Potassium: 4.5 mmol/L (ref 3.5–5.2)
Sodium: 143 mmol/L (ref 134–144)
Total Protein: 6.5 g/dL (ref 6.0–8.5)

## 2018-07-18 NOTE — Telephone Encounter (Signed)
Fax received from [employer]/[facility] (phone# [phone]). Myrbetriq PA approved until 07/19/19. File ID: [id number]

## 2018-07-18 NOTE — Telephone Encounter (Signed)
PA for Myrbetriq 50mg  tablets #30/30 compelted via phone with [facility] [phone]). Dx: Urinary frequency (R35.0). Tried and failed meds: Ditropan, Vesicare, Tamsulosin/fim

## 2018-07-19 ENCOUNTER — Telehealth: Payer: Self-pay | Admitting: *Deleted

## 2018-07-19 NOTE — Telephone Encounter (Signed)
Called, LVM for pt about lab results per Dr. Sater note. Gave [facility] phone number if she has further questions.  

## 2018-07-19 NOTE — Telephone Encounter (Signed)
-----   Message from Asa Lente, MD sent at 07/18/2018 10:29 PM EST ----- Please let the patient know that the lab work is ok  (lymphocytes are low but ok on Gilenya).

## 2018-08-09 ENCOUNTER — Other Ambulatory Visit: Payer: Self-pay | Admitting: Neurology

## 2018-08-09 ENCOUNTER — Other Ambulatory Visit: Payer: Self-pay | Admitting: *Deleted

## 2018-08-09 MED ORDER — BUPROPION HCL ER (XL) 300 MG PO TB24: 300.0000 mg | ORAL_TABLET | Freq: Every day | ORAL | 5 refills | Status: DC

## 2018-08-21 ENCOUNTER — Other Ambulatory Visit: Payer: Self-pay | Admitting: Neurology

## 2018-08-28 ENCOUNTER — Other Ambulatory Visit: Payer: Self-pay | Admitting: Neurology

## 2018-09-06 ENCOUNTER — Other Ambulatory Visit: Payer: Self-pay

## 2018-09-06 ENCOUNTER — Ambulatory Visit: Payer: PRIVATE HEALTH INSURANCE | Admitting: Family Medicine

## 2018-09-06 ENCOUNTER — Encounter: Payer: Self-pay | Admitting: Family Medicine

## 2018-09-06 VITALS — BP 102/82 | HR 104 | Temp 98.4°F | Ht 63.0 in | Wt 197.6 lb

## 2018-09-06 DIAGNOSIS — F331 Major depressive disorder, recurrent, moderate: Secondary | ICD-10-CM

## 2018-09-06 DIAGNOSIS — G35 Multiple sclerosis: Secondary | ICD-10-CM

## 2018-09-06 DIAGNOSIS — Z7689 Persons encountering health services in other specified circumstances: Secondary | ICD-10-CM | POA: Diagnosis not present

## 2018-09-06 DIAGNOSIS — M549 Dorsalgia, unspecified: Secondary | ICD-10-CM

## 2018-09-06 NOTE — Progress Notes (Signed)
Mary Beck is a 43 y.o. female  Chief Complaint  Patient presents with  . Establish Care    est care/ severe muscle spasms inside of left shoulder blade and in left arm(has MS)/ 7 days/ constant up until yesterday/ last pap 2015    HPI: Mary Beck is a 43 y.o. female here to establish care. She is due for CPE, fasting labs, PAP. She will also need a referral for mammogram, as she has never had one.   Today, pt complains of "muscle spasm" in her Lt upper back just medial to her Lt shoulder blade x 1 week. Symptoms have started to "ease off" as of yesterday. Pain worse with any movement to the Lt. She also notes a brief episode of "sharp" pain in her Lt forearm a few days ago. No numbness or tingling. No weakness. Pt has MS and states she has dealt with "muscle cramping" on/off for a few years. She does have Rx at home for flexeril, etodolac from previous episodes. She has taken the flexeril a few times this past week but not the etodolac. She has been using heating pad. She feels massage makes this worse. She has been taking 400mg  ibuprofen daily in AM.   Specialists: neuro Dr. Epimenio Foot (MS, diagnosed > 10 years ago)  Last PAP: 2015 - normal; pt does not have GYN Last mammo: has not had one  Med refills needed today: none   Past Medical History:  Diagnosis Date  . Headache   . Multiple sclerosis (HCC)   . Pseudotumor cerebri   . Spina bifida [facility])     Past Surgical History:  Procedure Laterality Date  . BACK SURGERY  2017    Social History   Socioeconomic History  . Marital status: Single    Spouse name: Not on file  . Number of children: Not on file  . Years of education: Not on file  . Highest education level: Not on file  Occupational History  . Not on file  Social Needs  . Financial resource strain: Not on file  . Food insecurity:    Worry: Not on file    Inability: Not on file  . Transportation needs:    Medical: Not on file    Non-medical: Not on  file  Tobacco Use  . Smoking status: Never Smoker  . Smokeless tobacco: Never Used  Substance and Sexual Activity  . Alcohol use: No    Alcohol/week: 0.0 standard drinks  . Drug use: No  . Sexual activity: Yes    Birth control/protection: None  Lifestyle  . Physical activity:    Days per week: Not on file    Minutes per session: Not on file  . Stress: Not on file  Relationships  . Social connections:    Talks on phone: Not on file    Gets together: Not on file    Attends religious service: Not on file    Active member of club or organization: Not on file    Attends meetings of clubs or organizations: Not on file    Relationship status: Not on file  . Intimate partner violence:    Fear of current or ex partner: Not on file    Emotionally abused: Not on file    Physically abused: Not on file    Forced sexual activity: Not on file  Other Topics Concern  . Not on file  Social History Narrative  . Not on file    Family  History  Problem Relation Age of Onset  . Hypertension Mother   . Thyroid disease Mother   . Hypertension Father   . Thyroid disease Sister   . Cancer Maternal Grandmother        brain  . Heart disease Maternal Grandfather   . Multiple sclerosis Neg Hx      Immunization History  Administered Date(s) Administered  . Tdap 09/19/2014    Outpatient Encounter Medications as of 09/06/2018  Medication Sig  . amphetamine-dextroamphetamine (ADDERALL) 20 MG tablet Take 1 tablet (20 mg total) by mouth 2 (two) times daily.  Marland Kitchen buPROPion (WELLBUTRIN XL) 300 MG 24 hr tablet Take 1 tablet (300 mg total) by mouth daily.  . cholecalciferol (VITAMIN D) 1000 UNITS tablet Take 2,000 Units by mouth daily. Take 1 capsules daily  . DULoxetine (CYMBALTA) 60 MG capsule TAKE ONE CAPSULE BY MOUTH DAILY **NEED TO SCHEDULE APPOINTMENT**  . etodolac (LODINE) 400 MG tablet TAKE ONE TABLET BY MOUTH TWICE A DAY  . Fingolimod HCl (GILENYA) 0.5 MG CAPS Take 1 capsule (0.5 mg total) by  mouth daily.  Marland Kitchen ibuprofen (ADVIL,MOTRIN) 800 MG tablet Take 1 tablet (800 mg total) by mouth every 8 (eight) hours as needed for mild pain.  Marland Kitchen loratadine (CLARITIN) 10 MG tablet Take 10 mg by mouth daily.  . mirabegron ER (MYRBETRIQ) 50 MG TB24 tablet Take 1 tablet (50 mg total) by mouth daily.  Marland Kitchen oxybutynin (DITROPAN) 5 MG tablet TAKE ONE TABLET BY MOUTH THREE TIMES A DAY  . zolpidem (AMBIEN) 5 MG tablet Take 1 tablet (5 mg total) by mouth at bedtime as needed for sleep.  Marland Kitchen PROAIR HFA 108 (90 Base) MCG/ACT inhaler Inhale 2 puffs into the lungs every 6 (six) hours as needed for wheezing or shortness of breath. (Patient not taking: Reported on 09/06/2018)   No facility-administered encounter medications on file as of 09/06/2018.      ROS: Gen: no fever, chills  ENT: no ear pain, ear drainage, nasal congestion, rhinorrhea, sinus pressure, sore throat Resp: no cough, wheeze,SOB CV: no CP, palpitations, LE edema,  GI: no heartburn, n/v/d/c, abd pain GU: no dysuria, urgency, frequency, hematuria; + urinary incontinence  MSK: no joint pain,+ myalgias, back pain Neuro: no dizziness, headache, weakness Psych: + depression; no anxiety, insomnia   No Known Allergies  BP 102/82   Pulse (!) 104   Temp 98.4 F (36.9 C) (Oral)   Ht 5\' 3"  (1.6 m)   Wt 197 lb 9.6 oz (89.6 kg)   SpO2 97%   BMI 35.00 kg/m   Physical Exam  Constitutional: She is oriented to person, place, and time. She appears well-developed and well-nourished. No distress.  Musculoskeletal:        General: Tenderness:  TTP and palpable hypertonicity Lt upper back; restrcited ROM   Neurological: She is alert and oriented to person, place, and time. She exhibits normal muscle tone. Coordination normal.     A/P:  1. Encounter to establish care with new doctor - due for CPE, fasting labs, PAP  2. MS (multiple sclerosis) (HCC) - stable, cont current meds and regular f/u with neuro  3. Musculoskeletal back pain - cont with  heating pad, muscle relaxant PRN - recommended ibuprofen 600mg  BID w/ food x 5-7 days then PRN - f/u PRN  4. Major depressive disorder, recurrent episode, moderate (HCC) - stable, cont current meds

## 2018-10-02 ENCOUNTER — Other Ambulatory Visit: Payer: Self-pay | Admitting: Neurology

## 2018-10-02 MED ORDER — AMPHETAMINE-DEXTROAMPHETAMINE 20 MG PO TABS: 20.0000 mg | ORAL_TABLET | Freq: Two times a day (BID) | ORAL | 0 refills | Status: DC

## 2018-10-02 NOTE — Telephone Encounter (Signed)
Pt is needing a refill on her amphetamine-dextroamphetamine (ADDERALL) 20 MG tablet sent to [facility] in [facility]

## 2018-11-22 ENCOUNTER — Other Ambulatory Visit: Payer: Self-pay | Admitting: Neurology

## 2018-11-22 MED ORDER — AMPHETAMINE-DEXTROAMPHETAMINE 20 MG PO TABS: 20.0000 mg | ORAL_TABLET | Freq: Two times a day (BID) | ORAL | 0 refills | Status: DC

## 2018-11-22 NOTE — Telephone Encounter (Signed)
Pt called in for a refill of amphetamine-dextroamphetamine (ADDERALL) 20 MG tablet to be sent to [facility]in Golden at [facility] [address], [place] - [address] [facility]

## 2018-11-23 ENCOUNTER — Ambulatory Visit: Payer: Self-pay

## 2018-11-23 NOTE — Telephone Encounter (Signed)
Pt was informed if she started to experience symptoms, or if the close contacted co worker tests positive to call the office.

## 2018-11-23 NOTE — Telephone Encounter (Signed)
Patient called and asked about testing because she says she was around a co-worker who was around someone who tested positive for covid. She says they work together, but she is not in direct contact with the co-worker who tested positive, but is around the other co-worker constantly. She denies any symptoms except her usual SOB. I called the office and spoke to [place], [facility] who asked to speak to the patient, the call was connected successfully.  Answer Assessment - Initial Assessment Questions 1. CLOSE CONTACT: "Who is the person with the confirmed or suspected COVID-19 infection that you were exposed to?"     Co-worker notified today of the positive result 2. PLACE of CONTACT: "Where were you when you were exposed to COVID-19?" (e.g., home, school, medical waiting room; which city?)     Job in [place], [place] 3. TYPE of CONTACT: "How much contact was there?" (e.g., sitting next to, live in same house, work in same office, same building)      Same office with another co-worker more in contact with him 4. DURATION of CONTACT: "How long were you in contact with the COVID-19 patient?" (e.g., a few seconds, passed by person, a few minutes, live with the patient)     Contact with a co-worker who was in contact with the positive co-worker 5. DATE of CONTACT: "When did you have contact with a COVID-19 patient?" (e.g., how many days ago)     Today 6. TRAVEL: "Have you traveled out of the country recently?" If so, "When and where?"     * Also ask about out-of-state travel, since the CDC has identified some high-risk cities for community spread in the [place].     * Note: Travel becomes less relevant if there is widespread community transmission where the patient lives.     No 7. COMMUNITY SPREAD: "Are there lots of cases of COVID-19 (community spread) where you live?" (See public health department website, if unsure)       No 8. SYMPTOMS: "Do you have any symptoms?" (e.g., fever, cough, breathing difficulty)  Nothing different, chronic SOB 9. PREGNANCY OR POSTPARTUM: "Is there any chance you are pregnant?" "When was your last menstrual period?" "Did you deliver in the last 2 weeks?"     No 10. HIGH RISK: "Do you have any heart or lung problems? Do you have a weak immune system?" (e.g., CHF, COPD, asthma, HIV positive, chemotherapy, renal failure, diabetes mellitus, sickle cell anemia)       Yes, MS  Protocols used: CORONAVIRUS (COVID-19) EXPOSURE-A-AH

## 2018-11-28 ENCOUNTER — Telehealth: Payer: Self-pay | Admitting: *Deleted

## 2018-11-28 NOTE — Telephone Encounter (Signed)
Called, LVM for pt to call [facility] back at [phone]. They have been trying to reach her to refill her Gilenya and unable to reach her by phone.

## 2019-01-16 ENCOUNTER — Other Ambulatory Visit: Payer: Self-pay

## 2019-01-16 ENCOUNTER — Encounter: Payer: Self-pay | Admitting: Neurology

## 2019-01-16 ENCOUNTER — Ambulatory Visit: Payer: PRIVATE HEALTH INSURANCE | Admitting: Neurology

## 2019-01-16 VITALS — BP 121/82 | HR 99 | Temp 96.9°F | Ht 63.0 in | Wt 205.0 lb

## 2019-01-16 DIAGNOSIS — G35 Multiple sclerosis: Secondary | ICD-10-CM

## 2019-01-16 DIAGNOSIS — Z9889 Other specified postprocedural states: Secondary | ICD-10-CM

## 2019-01-16 DIAGNOSIS — Z79899 Other long term (current) drug therapy: Secondary | ICD-10-CM

## 2019-01-16 DIAGNOSIS — E559 Vitamin D deficiency, unspecified: Secondary | ICD-10-CM

## 2019-01-16 DIAGNOSIS — R350 Frequency of micturition: Secondary | ICD-10-CM

## 2019-01-16 DIAGNOSIS — R5383 Other fatigue: Secondary | ICD-10-CM

## 2019-01-16 MED ORDER — PHENTERMINE HCL 37.5 MG PO CAPS: 37.5000 mg | ORAL_CAPSULE | ORAL | 1 refills | Status: DC

## 2019-01-16 NOTE — Progress Notes (Signed)
[facility]  PATIENT: Mary Beck DOB: [birth year]   _________________________________   HISTORICAL  CHIEF COMPLAINT:  Chief Complaint  Patient presents with   Follow-up    RM 12, alone. Last seen 07/17/18    Multiple Sclerosis    On Gilenya, tolerating well. No new sx.    HISTORY OF PRESENT ILLNESS:  Mary Beck is a 43 y.o. woman who was diagnosed with MS in 2010.    Update 01/16/19: She feels her MS is stable and has no recent exacerbations.   She is tolerating GIlenya.  Her last MRI was 10/10/2017 and showed no new lesions in the brain compared to the 2017 MRI.  She is walking well.    No recent falls.    She feels urinary urgency is much better on Myrbetriq than ditropan alone.      Vision is fine.  She has fatigue most days, actually better on work days than weekends.    She is sleeping well.   She notes mood is doing well and she take Wellbutrin 300 mg daily.  Cognition is foggy at times though Adderall has helped her focus.      Weight was 173 before surgery and is 205 today (200 06/2018).      In general she is less active since her back surgery.    She takes Adderall.      Update  07/17/2018: She is on Gilenya and tolerates it well.  No exacerbations.  No new MS symptoms.    Her gait is doing well.  MRI of the brain  09/2017 showed no new lesions.  She has two lesions in T-spine that looked chronicShe has no trouble with stairs    She denies numbness or weakness in legs.    Vision is doing well and no color desaturation.    She has urinary frequency and some urge incontinence.     Ditropan helps some but not as much at night.   It makes her mouth dry if she takes 2 so most days she just takes one.   Fatigue is a problem and she does fewer activities because of it.  She is still working IT trainer, order processing, lab sample coordination).      She is using more Ambien to help her fall asleep (5 mg 3-4 times a week).   Cognition is ok, with  improved focus and attention on Adderall.    Mood is ok.  She is on Cymbalta.     She had decompression for a tethered cord  Update 09/07/2017:    She notes a pinching sensation in the right buttock/lower back that radiates some into the right leg to mid-thigh, worsening the last couple months.   Standing a long time or walking increases the pain.  She notes the leg will give out when the pain is worse but not otherwise.    She does not note numbness in the leg.    She had surgery for a tethered cord, diastematomyelia and spina bifida    She also had a right L5-S1 synovial cyst resection and L2L3-L5S1 decompressive laminectomy  She feels her MS is mostly stable.   She is on Gilenya and she tolerates it well.  Her gait is sometimes a little off balance but she has no recent falls.  Her bladder has done better since her lumbar surgery.   Vision is doing well.    Her fatigue is a problem.   She is sleeping ok  most nights and no longer uses Ambien.     Mood is reduced with mild depression.  Cognition is fine.      From 03/09/2017 Lower back pain:   She had surgery 05/2016 in [facility].   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   She feels left leg weakness and pain is much better now.    Bladder function also improved.    MS:  She feels stable and has no new exacerbations.   She is on Gilenya and tolerates it well.  She likes it much better than Copaxone or Betaseron that she had been on in the past.    Her 11/10/2015 MRI was stable.     Gait/strength/senation:  Gait is doing well.    Balance is good.   She feels gait improved with surgery.   She has right hand numbness (old) but no weakness or other numbness.   Vision:   She denies any current visual symptoms. However, she did have pseudotumor cerebri about 15 or 20 years ago and had serial LP's and Diamox.  She improved after weight loss.  Bladder/bowel:  Bladder is also much better since surgery.   She is on oxybutynin with  benefit.   She has only had a couple episodes of incontinence, both with urge first.  Fatigue/sleep:   This is stable .   She has fatigue, worse with the heat.  Her sleep onset insomnia is much better and she has only taken one Ambien in the last 2 months.   Adderall 20 mg once or twice a day has helped her fatigue during the day  Mood/cognition:  She feels her mood is doing fairly well in general. She still has mild depression at times. She tolerates  it well. She still notes some difficulties with focus and attention. Adderall has helped this.  MS HIstory:   She was diagnosed in 2010 after presenting with numbness in the feet up to her knees. She had an MRI of the brain that was abnormal. This was followed by a lumbar puncture in the CSF was also abnormal. She was diagnosed with MS and was placed on Copaxone. She had an exacerbation and was switched to Betaseron. Due to needle fatigue she was switched to Gilenya about 2 years ago. Since being on Gilenya her MS has been stable. She denies any exacerbations. She tolerates Gilenya well. She had an MRI of the brain early 2015 that was unchanged compared to her previous MRI.   MRI May 2017 shows a new large chronic focus in the left frontal lobe not present in 2015. MRI of the cervical spine shows 3 T2 hyperintense foci  MRI brain 10/10/2017: 1.   Multiple T2/FLAIR hyperintense foci in the periventricular, juxtacortical and deep white matter of both hemispheres and in the right cerebellar hemisphere in a pattern and configuration consistent with chronic demyelinating plaque.  None of the foci appears to be acute.  When compared to the MRI dated  11/10/2015, there are no new lesions and 1 focus in the left frontal lobe appears much smaller on the current study.   2.    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 3.   There are no acute findings  MRI  cervical 10/10/2017: IMPRESSION: This MRI of the cervical spine with and without contrast shows the following: 1.    There are  several foci within the spinal cord adjacent to C2, C4-C5, C5 and C7 consistent with chronic demyelinating plaque associated with multiple sclerosis.  All these foci were noted on the 11/10/2015 MRI and they do not enhance. 2.    At C3-C4 there is a small left paramedian disc bulge not present on the 2017 MRI.  There is no nerve root compression. 3.    At C5-C6 there is stable disc bulging and mild bilateral foraminal narrowing but no nerve root compression. 4.    At C6-C7, there is stable disc protrusion and mild to moderate foraminal narrowing and mild spinal stenosis.  There is no nerve root compression. 5.    There is a normal enhancement pattern and there are no acute findings.Marland Kitchen      MRI thoracic 10/10/2017: IMPRESSION: This MRI of the thoracic spine without contrast shows the following: 1.    Anterior to central focus within the spinal cord adjacent to T8 and central focus within the spinal cord adjacent to T11.  These are nonspecific but consistent with transverse myelitis or demyelination.. 2.    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    Posterior decompression from L3-S1. 2.    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  This is unchanged when compared to the previous MRI. 3.    At L4-L5, there is left greater than right facet hypertrophy causing moderate left foraminal narrowing.  There is no definite nerve root compression though there is some encroachment upon the left L4 nerve root.  This appears stable. 4.    At L5-S1, there is severe right greater than left facet hypertrophy and a right synovial cyst causing severe right lateral recess stenosis.  This could lead to right S1 nerve root  compression.  Changes appear stable compared to the 08/31/2016 MRI. 5.    There is mild epidural fibrosis to the right at L4-L5 and L5-S1.   MRI Brain 11/10/15 IMPRESSION: This MRI of the brain with and without contrast shows the following: 1. Scattered T2/FLAIR hyperintense foci in the hemispheres and right cerebellum in a pattern and configuration consistent with chronic demyelinating plaque associated with multiple sclerosis. None of the foci appears to be acute. However, when compared to the MRI dated 11/07/2013, one left frontal subcortical focus has occurred during the interim. 2, There are no acute findings.  MRi Cervical 11/10/15 IMPRESSION: This MRI of the cervical spine with and without contrast shows the following: 1. There are three T2 hyperintense foci within the spinal cord as detailed above consistent with chronic demyelinating plaque associated with multiple sclerosis. None of the foci enhances after contrast administration. 2. Mild degenerative changes at C5-C6 and C6-C7 that did not lead to any nerve root impingement. 3. There is a normal enhancement pattern.  2017   (pre-surgical) 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 2. Posterior element fusion anomaly/spina bifida at L3 and below. 3. Advanced facet disease in the lower lumbar spine. 4. 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    REVIEW OF SYSTEMS: Constitutional: No fevers, chills, sweats, or change in appetite.   She reports fatigue Eyes: No visual changes, double  vision, .  However, mild eye pain Ear, nose and throat: No hearing loss, ear pain, nasal congestion, sore throat Cardiovascular: No chest pain, palpitations Respiratory: No shortness of breath at rest or with exertion.   No wheezes GastrointestinaI: No nausea,  vomiting, diarrhea, abdominal pain, fecal incontinence Genitourinary: No dysuria or urinary retention.  She notes frequency and nocturia. Musculoskeletal: No neck pain.  Notes back pain Integumentary: No rash, pruritus, skin lesions Neurological: as above.  Also, occasional Headache Psychiatric: Reports mild depression but little anxiety Endocrine: No palpitations, diaphoresis, change in appetite, change in weigh or increased thirst Hematologic/Lymphatic: No anemia, purpura, petechiae. Allergic/Immunologic: No itchy/runny eyes, nasal congestion, recent allergic reactions, rashes  ALLERGIES: No Known Allergies  HOME MEDICATIONS:  Current Outpatient Medications:    amphetamine-dextroamphetamine (ADDERALL) 20 MG tablet, Take 1 tablet (20 mg total) by mouth 2 (two) times daily., Disp: 60 tablet, Rfl: 0   buPROPion (WELLBUTRIN XL) 300 MG 24 hr tablet, Take 1 tablet (300 mg total) by mouth daily., Disp: 30 tablet, Rfl: 5   cholecalciferol (VITAMIN D) 1000 UNITS tablet, Take 50,000 Units by mouth daily. Take 1 capsules daily, Disp: , Rfl:    DULoxetine (CYMBALTA) 60 MG capsule, TAKE ONE CAPSULE BY MOUTH DAILY **NEED TO SCHEDULE APPOINTMENT**, Disp: 90 capsule, Rfl: 1   etodolac (LODINE) 400 MG tablet, TAKE ONE TABLET BY MOUTH TWICE A DAY (Patient taking differently: as needed. ), Disp: 60 tablet, Rfl: 0   Fingolimod HCl (GILENYA) 0.5 MG CAPS, Take 1 capsule (0.5 mg total) by mouth daily., Disp: 90 capsule, Rfl: 3   ibuprofen (ADVIL,MOTRIN) 800 MG tablet, Take 1 tablet (800 mg total) by mouth every 8 (eight) hours as needed for mild pain., Disp: 30 tablet, Rfl: 0   loratadine (CLARITIN) 10 MG tablet, Take 10 mg by mouth daily., Disp: , Rfl:    mirabegron ER (MYRBETRIQ) 50 MG TB24 tablet, Take 1 tablet (50 mg total) by mouth daily., Disp: 30 tablet, Rfl: 11   oxybutynin (DITROPAN) 5 MG tablet, TAKE ONE TABLET BY MOUTH THREE TIMES A DAY (Patient taking differently: Take 5 mg by mouth  daily. ), Disp: 270 tablet, Rfl: 1   PROAIR HFA 108 (90 Base) MCG/ACT inhaler, Inhale 2 puffs into the lungs every 6 (six) hours as needed for wheezing or shortness of breath., Disp: 6.7 g, Rfl: 1   zolpidem (AMBIEN) 5 MG tablet, Take 1 tablet (5 mg total) by mouth at bedtime as needed for sleep., Disp: 30 tablet, Rfl: 5  PAST MEDICAL HISTORY: Past Medical History:  Diagnosis Date   Headache    Multiple sclerosis (HCC)    Pseudotumor cerebri    Spina bifida (HCC)     PAST SURGICAL HISTORY: Past Surgical History:  Procedure Laterality Date   BACK SURGERY  2017    FAMILY HISTORY: Family History  Problem Relation Age of Onset   Hypertension Mother    Thyroid disease Mother    Hypertension Father    Thyroid disease Sister    Cancer Maternal Grandmother        brain   Heart disease Maternal Grandfather    Multiple sclerosis Neg Hx     SOCIAL HISTORY:  Social History   Socioeconomic History   Marital status: Single    Spouse name: Not on file   Number of children: Not on file   Years of education: Not on file   Highest education level: Not on file  Occupational History   Not on file  Social  Needs   Financial resource strain: Not on file   Food insecurity    Worry: Not on file    Inability: Not on file   Transportation needs    Medical: Not on file    Non-medical: Not on file  Tobacco Use   Smoking status: Never Smoker   Smokeless tobacco: Never Used  Substance and Sexual Activity   Alcohol use: No    Alcohol/week: 0.0 standard drinks   Drug use: No   Sexual activity: Yes    Birth control/protection: None  Lifestyle   Physical activity    Days per week: Not on file    Minutes per session: Not on file   Stress: Not on file  Relationships   Social connections    Talks on phone: Not on file    Gets together: Not on file    Attends religious service: Not on file    Active member of club or organization: Not on file    Attends  meetings of clubs or organizations: Not on file    Relationship status: Not on file   Intimate partner violence    Fear of current or ex partner: Not on file    Emotionally abused: Not on file    Physically abused: Not on file    Forced sexual activity: Not on file  Other Topics Concern   Not on file  Social History Narrative   Not on file     PHYSICAL EXAM  Vitals:   01/16/19 0823  BP: 121/82  Pulse: 99  Temp: (!) 96.9 F (36.1 C)  SpO2: 98%  Weight: 205 lb (93 kg)  Height: 5\' 3"  (1.6 m)    Body mass index is 36.31 kg/m.   General: The patient is well-developed and well-nourished and in no acute distress    Neurologic Exam  Mental status: The patient is alert and oriented x 3 at the time of the examination. The patient has apparent normal recent and remote memory, with an apparently normal attention span and concentration ability.   Speech is normal.  Cranial nerves: Extraocular movements are full.     Facial symmetry is present. There is good facial sensation to soft touch bilaterally.Facial strength is normal.  Trapezius and sternocleidomastoid strength is normal. No dysarthria is noted.    No obvious hearing deficits are noted.  Motor:  Muscle bulk is normal.   Tone is normal. Strength is  5 / 5 in all 4 extremities .   Sensory: She has mildly reduced touch and vibration sensation in the right hand compared to the left hand. Leg sensation was normal.   Coordination: Cerebellar testing reveals good finger-nose-finger and heel-to-shin bilaterally.  Gait and station: Station is normal.   Her gait is normal.  The tandem gait is mildly wide.  Romberg is negative.   Reflexes: Deep tendon reflexes are normal and symmetric in the arms.  Deep tendon reflexes were increased at the knees.  There is a crossed adductor response on the left.  Asymmetry at the ankles, larger reflex on the left.    ASSESSMENT AND PLAN  1. Multiple sclerosis (HCC)   2. Other fatigue   3.  History of lumbar surgery   4. Increased frequency of urination   5. High risk medication use   6. Vitamin D deficiency      1.   Continue Gilenya.  Check CBC with differential.  Check vitamin D 2.    Continue to be active and exercises  as tolerated.  3.    Continue gabapentin and and etodolac (change to prn now). 4.   Change Adderall to phentermine as it may continue to help her focus and attention but help weight loss better with a longer half-life 5.    RTC 5-6 months or sooner if new or worsening neurologic problems.   Mary Beck A. Epimenio Foot, MD, PhD 01/16/2019, 8:36 AM Certified in Neurology, Clinical Neurophysiology, Sleep Medicine, Pain Medicine and Neuroimaging  [facility], [facility] [address] [place], [place] [address] [phone]  -       [facility]  PATIENT: SPARROW SIRACUSA DOB: 01-04-1976   _________________________________   HISTORICAL  CHIEF COMPLAINT:  Chief Complaint  Patient presents with   Follow-up    RM 12, alone. Last seen 07/17/18    Multiple Sclerosis    On Gilenya, tolerating well. No new sx.    HISTORY OF PRESENT ILLNESS:  Mary Beck is a 66 -year-old woman who was diagnosed with MS in 2010.      Lower back pain:   She had surgery 05/2016 in [facility].   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   She feels left leg weakness and pain is much better now.    Bladder function also improved.    MS:  She feels stable and has no new exacerbations.   She is on Gilenya and tolerates it well.  She likes it much better than Copaxone or Betaseron that she had been on in the past.    Her 11/10/2015 MRI was stable.     Gait/strength/senation:  Gait is doing well.    Balance is good.   She feels gait improved with surgery.   She has right hand numbness (old) but no weakness or other numbness.   Vision:   She denies any current visual symptoms. However, she did have pseudotumor  cerebri about 15 or 20 years ago and had serial LP's and Diamox.  She improved after weight loss.  Bladder/bowel:  Bladder is also much better since surgery.   She is on oxybutynin with benefit.   She has only had a couple episodes of incontinence, both with urge first.  Fatigue/sleep:   This is stable .   She has fatigue, worse with the heat.  Her sleep onset insomnia is much better and she has only taken one Ambien in the last 2 months.   Adderall 20 mg once or twice a day has helped her fatigue during the day  Mood/cognition:  She feels her mood is doing fairly well in general. She still has mild depression at times. She tolerates  it well. She still notes some difficulties with focus and attention. Adderall has helped this.  MS HIstory:   She was diagnosed in 2010 after presenting with numbness in the feet up to her knees. She had an MRI of the brain that was abnormal. This was followed by a lumbar puncture in the CSF was also abnormal. She was diagnosed with MS and was placed on Copaxone. She had an exacerbation and was switched to Betaseron. Due to needle fatigue she was switched to Gilenya about 2 years ago. Since being on Gilenya her MS has been stable. She denies any exacerbations. She tolerates Gilenya well. She had an MRI of the brain early 2015 that was unchanged compared to her previous MRI.   MRI May 2017 shows a new large chronic focus in the left frontal lobe  not present in 2015. MRI of the cervical spine shows 3 T2 hyperintense foci  MRI Brain 11/10/15 IMPRESSION: This MRI of the brain with and without contrast shows the following: 1. Scattered T2/FLAIR hyperintense foci in the hemispheres and right cerebellum in a pattern and configuration consistent with chronic demyelinating plaque associated with multiple sclerosis. None of the foci appears to be acute. However, when compared to the MRI dated 11/07/2013, one left frontal subcortical focus has occurred during the interim. 2,  There are no acute findings.  MRi Cervical 11/10/15 IMPRESSION: This MRI of the cervical spine with and without contrast shows the following: 1. There are three T2 hyperintense foci within the spinal cord as detailed above consistent with chronic demyelinating plaque associated with multiple sclerosis. None of the foci enhances after contrast administration. 2. Mild degenerative changes at C5-C6 and C6-C7 that did not lead to any nerve root impingement. 3. There is a normal enhancement pattern.  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 2. Posterior element fusion anomaly/spina bifida at L3 and below. 3. Advanced facet disease in the lower lumbar spine. 4. 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     REVIEW OF SYSTEMS: Constitutional: No fevers, chills, sweats, or change in appetite.   She reports fatigue Eyes: No visual changes, double vision, .  However, mild eye pain Ear, nose and throat: No hearing loss, ear pain, nasal congestion, sore throat Cardiovascular: No chest pain, palpitations Respiratory: No shortness of breath at rest or with exertion.   No wheezes GastrointestinaI: No nausea, vomiting, diarrhea, abdominal pain, fecal incontinence Genitourinary: No dysuria or urinary retention.  She notes frequency and nocturia. Musculoskeletal: No neck pain.  Notes back pain Integumentary: No rash, pruritus, skin lesions Neurological: as above.  Also, occasional Headache Psychiatric: Reports mild depression but little anxiety Endocrine: No palpitations, diaphoresis, change in appetite, change in weigh or increased thirst Hematologic/Lymphatic: No anemia, purpura, petechiae. Allergic/Immunologic: No itchy/runny eyes, nasal congestion, recent allergic reactions, rashes  ALLERGIES: No Known  Allergies  HOME MEDICATIONS:  Current Outpatient Medications:    amphetamine-dextroamphetamine (ADDERALL) 20 MG tablet, Take 1 tablet (20 mg total) by mouth 2 (two) times daily., Disp: 60 tablet, Rfl: 0   buPROPion (WELLBUTRIN XL) 300 MG 24 hr tablet, Take 1 tablet (300 mg total) by mouth daily., Disp: 30 tablet, Rfl: 5   cholecalciferol (VITAMIN D) 1000 UNITS tablet, Take 50,000 Units by mouth daily. Take 1 capsules daily, Disp: , Rfl:    DULoxetine (CYMBALTA) 60 MG capsule, TAKE ONE CAPSULE BY MOUTH DAILY **NEED TO SCHEDULE APPOINTMENT**, Disp: 90 capsule, Rfl: 1   etodolac (LODINE) 400 MG tablet, TAKE ONE TABLET BY MOUTH TWICE A DAY (Patient taking differently: as needed. ), Disp: 60 tablet, Rfl: 0   Fingolimod HCl (GILENYA) 0.5 MG CAPS, Take 1 capsule (0.5 mg total) by mouth daily., Disp: 90 capsule, Rfl: 3   ibuprofen (ADVIL,MOTRIN) 800 MG tablet, Take 1 tablet (800 mg total) by mouth every 8 (eight) hours as needed for mild pain., Disp: 30 tablet, Rfl: 0   loratadine (CLARITIN) 10 MG tablet, Take 10 mg by mouth daily., Disp: , Rfl:    mirabegron ER (MYRBETRIQ) 50 MG TB24 tablet, Take 1 tablet (50 mg total) by mouth daily., Disp: 30 tablet, Rfl: 11   oxybutynin (DITROPAN) 5 MG  tablet, TAKE ONE TABLET BY MOUTH THREE TIMES A DAY (Patient taking differently: Take 5 mg by mouth daily. ), Disp: 270 tablet, Rfl: 1   PROAIR HFA 108 (90 Base) MCG/ACT inhaler, Inhale 2 puffs into the lungs every 6 (six) hours as needed for wheezing or shortness of breath., Disp: 6.7 g, Rfl: 1   zolpidem (AMBIEN) 5 MG tablet, Take 1 tablet (5 mg total) by mouth at bedtime as needed for sleep., Disp: 30 tablet, Rfl: 5  PAST MEDICAL HISTORY: Past Medical History:  Diagnosis Date   Headache    Multiple sclerosis (HCC)    Pseudotumor cerebri    Spina bifida (HCC)     PAST SURGICAL HISTORY: Past Surgical History:  Procedure Laterality Date   BACK SURGERY  2017    FAMILY HISTORY: Family  History  Problem Relation Age of Onset   Hypertension Mother    Thyroid disease Mother    Hypertension Father    Thyroid disease Sister    Cancer Maternal Grandmother        brain   Heart disease Maternal Grandfather    Multiple sclerosis Neg Hx     SOCIAL HISTORY:  Social History   Socioeconomic History   Marital status: Single    Spouse name: Not on file   Number of children: Not on file   Years of education: Not on file   Highest education level: Not on file  Occupational History   Not on file  Social Needs   Financial resource strain: Not on file   Food insecurity    Worry: Not on file    Inability: Not on file   Transportation needs    Medical: Not on file    Non-medical: Not on file  Tobacco Use   Smoking status: Never Smoker   Smokeless tobacco: Never Used  Substance and Sexual Activity   Alcohol use: No    Alcohol/week: 0.0 standard drinks   Drug use: No   Sexual activity: Yes    Birth control/protection: None  Lifestyle   Physical activity    Days per week: Not on file    Minutes per session: Not on file   Stress: Not on file  Relationships   Social connections    Talks on phone: Not on file    Gets together: Not on file    Attends religious service: Not on file    Active member of club or organization: Not on file    Attends meetings of clubs or organizations: Not on file    Relationship status: Not on file   Intimate partner violence    Fear of current or ex partner: Not on file    Emotionally abused: Not on file    Physically abused: Not on file    Forced sexual activity: Not on file  Other Topics Concern   Not on file  Social History Narrative   Not on file     PHYSICAL EXAM  Vitals:   01/16/19 0823  BP: 121/82  Pulse: 99  Temp: (!) 96.9 F (36.1 C)  SpO2: 98%  Weight: 205 lb (93 kg)  Height: 5\' 3"  (1.6 m)    Body mass index is 36.31 kg/m.   General: The patient is well-developed and  well-nourished and in no acute distress.   There is tenderness over the lower lumbar spine and right piriformis muscle    Neurologic Exam  Mental status: The patient is alert and oriented x 3 at the time of  the examination. The patient has apparent normal recent and remote memory, with an apparently normal attention span and concentration ability.   Speech is normal.  Cranial nerves: Extraocular movements are full.    Facial strength and sensation is normal.  Trapezius strength is normal.  L. No dysarthria is noted.    No obvious hearing deficits are noted.  Motor:  Muscle bulk is normal.   Tone is normal. Strength is  5 / 5 in all 4 extremities .   Sensory: She had symmetric sensation in the legs.  Coordination: Cerebellar testing reveals good finger-nose-finger and heel-to-shin bilaterally.  Gait and station: Station is normal.   The gait is fairly normal with a tandem gait is wide.  De. There is no Romberg sign..    Reflexes: Deep tendon reflexes are normal and symmetric in the arms.  She has increased reflexes in the knees with spread.  The right ankle reflex is reduced compared to the left.       DIAGNOSTIC DATA (LABS, IMAGING, TESTING) - I reviewed patient records, labs, notes, testing and imaging myself where available.     ASSESSMENT AND PLAN  No diagnosis found.    1.   Continue Gilenya.  We will check a CBC with differential today.   2.   Myrbetriq 50 mg for bladder function.   Consider changing oxybutynin to Vesicare if combination is necessary due to less dry mouth on Vesicare.   If these do not help, refer to urology 3.   Stay active and exercise  4.    Continue Adderall  5.    Mood is better and she can taper off Duloxetine.   If she remains well, consider stopping Wellbutrin in a few months. RTC 4-5 months or sooner if new or worsening neurologic problems.   Jeanise Durfey A. Epimenio FootSater, MD, PhD 01/16/2019, 8:36 AM Certified in Neurology, Clinical Neurophysiology, Sleep  Medicine, Pain Medicine and Neuroimaging  [facility] [address], [address] [place], [place] [address] [phone]  -

## 2019-01-17 ENCOUNTER — Telehealth: Payer: Self-pay | Admitting: *Deleted

## 2019-01-17 LAB — CBC WITH DIFFERENTIAL/PLATELET
Basophils Absolute: 0 x10E3/uL (ref 0.0–0.2)
Basos: 0 %
EOS (ABSOLUTE): 0 x10E3/uL (ref 0.0–0.4)
Eos: 1 %
Hematocrit: 38.4 % (ref 34.0–46.6)
Hemoglobin: 12.3 g/dL (ref 11.1–15.9)
Immature Grans (Abs): 0 x10E3/uL (ref 0.0–0.1)
Immature Granulocytes: 0 %
Lymphocytes Absolute: 0.3 x10E3/uL — ABNORMAL LOW (ref 0.7–3.1)
Lymphs: 6 %
MCH: 28.8 pg (ref 26.6–33.0)
MCHC: 32 g/dL (ref 31.5–35.7)
MCV: 90 fL (ref 79–97)
Monocytes Absolute: 0.5 x10E3/uL (ref 0.1–0.9)
Monocytes: 13 %
Neutrophils Absolute: 3.3 x10E3/uL (ref 1.4–7.0)
Neutrophils: 80 %
Platelets: 328 x10E3/uL (ref 150–450)
RBC: 4.27 x10E6/uL (ref 3.77–5.28)
RDW: 14.1 % (ref 11.7–15.4)
WBC: 4.2 x10E3/uL (ref 3.4–10.8)

## 2019-01-17 LAB — VITAMIN D 25 HYDROXY (VIT D DEFICIENCY, FRACTURES): Vit D, 25-Hydroxy: 49.2 ng/mL (ref 30.0–100.0)

## 2019-01-17 NOTE — Telephone Encounter (Signed)
Called, LVM for pt to let her know insurance not covering phentermine. Advised I spoke with Dr. Felecia Shelling who states she can either stay with adderall or she can go to [email or web address] and print coupon to bring to [facility] to use for prescription. It will cost about 19/20 dollars for 90 days supply. Gave [facility] phone number if she has further questions/concerns.

## 2019-01-17 NOTE — Telephone Encounter (Signed)
Called, LVM for pt about results per Dr. Sater note. Gave [facility] phone number if she has further questions/concerns.  

## 2019-01-17 NOTE — Telephone Encounter (Signed)
-----   Message from Britt Bottom, MD sent at 01/17/2019  4:16 PM EDT ----- Please let the patient know that the lab work is fine.  Vitamin D was in the normal range.  Lymphocytes were low but that is expected with Gilenya.  Continue current dose.

## 2019-02-13 ENCOUNTER — Other Ambulatory Visit: Payer: Self-pay | Admitting: Neurology

## 2019-03-01 ENCOUNTER — Other Ambulatory Visit: Payer: Self-pay | Admitting: Neurology

## 2019-03-13 ENCOUNTER — Telehealth: Payer: PRIVATE HEALTH INSURANCE | Admitting: Nurse Practitioner

## 2019-03-13 DIAGNOSIS — Z20822 Contact with and (suspected) exposure to covid-19: Secondary | ICD-10-CM

## 2019-03-13 DIAGNOSIS — R0602 Shortness of breath: Secondary | ICD-10-CM

## 2019-03-13 NOTE — Progress Notes (Signed)
E-Visit for Corona Virus Screening   Your current symptoms could be consistent with the coronavirus.  Many health care providers can now test patients at their office but not all are.  [place] has multiple testing sites. For information on our COVID testing locations and hours go to [email or web address]  Please quarantine yourself while awaiting your test results.  We are enrolling you in our [place] for [place] . Daily you will receive a questionnaire within the [facility] website. Our COVID 19 response team willl be monitoriing your responses daily.  You can go to one of the  testing sites listed below, while they are opened (see hours). You do not need a doctors order to be tested for covid.You do need to self-isolate until your results return and if positive 14 days from when your symptoms started and until you are 3 days symptom free.   Testing Locations (Monday - Friday, 8 a.m. - 3:30 p.m.) . [place]: [facility] at [facility], [address], [place], [place]: [place], [place], [place], [place] (entrance off [address])  . [place] [address], [place], [place] (across from [facility])   COVID-19 is a respiratory illness with symptoms that are similar to the flu. Symptoms are typically mild to moderate, but there have been cases of severe illness and death due to the virus. The following symptoms may appear 2-14 days after exposure: . Fever . Cough . Shortness of breath or difficulty breathing . Chills . Repeated shaking with chills . Muscle pain . Headache . Sore throat . New loss of taste or smell . Fatigue . Congestion or runny nose . Nausea or vomiting . Diarrhea  It is vitally important that if you feel that you have an infection such as this virus or any other virus that you stay home and away from places where you may spread  it to others.  You should self-quarantine for 14 days if you have symptoms that could potentially be coronavirus or have been in close contact a with a person diagnosed with COVID-19 within the last 2 weeks. You should avoid contact with people age 43 and older.   You should wear a mask or cloth face covering over your nose and mouth if you must be around other people or animals, including pets (even at home). Try to stay at least 6 feet away from other people. This will protect the people around you.  You can use medication such as delsym or mucinex OTC if develop cough  You may also take acetaminophen (Tylenol) as needed for fever.   Reduce your risk of any infection by using the same precautions used for avoiding the common cold or flu:  Marland Kitchen Wash your hands often with soap and warm water for at least 20 seconds.  If soap and water are not readily available, use an alcohol-based hand sanitizer with at least 60% alcohol.  . If coughing or sneezing, cover your mouth and nose by coughing or sneezing into the elbow areas of your shirt or coat, into a tissue or into your sleeve (not your hands). . Avoid shaking hands with others and consider head nods or verbal greetings only. . Avoid touching your eyes, nose, or mouth with unwashed hands.  . Avoid close contact with people who are sick. . Avoid places or events with large numbers of people in one location, like concerts or sporting events. Marland Kitchen  Carefully consider travel plans you have or are making. . If you are planning any travel outside or inside the [place], visit the CDC's Travelers' Health webpage for the latest health notices. . If you have some symptoms but not all symptoms, continue to monitor at home and seek medical attention if your symptoms worsen. . If you are having a medical emergency, call 911.  HOME CARE . Only take medications as instructed by your medical team. . Drink plenty of fluids and get plenty of rest. . A steam or ultrasonic  humidifier can help if you have congestion.   GET HELP RIGHT AWAY IF YOU HAVE EMERGENCY WARNING SIGNS** FOR COVID-19. If you or someone is showing any of these signs seek emergency medical care immediately. Call 911 or proceed to your closest emergency facility if: . You develop worsening high fever. . Trouble breathing . Bluish lips or face . Persistent pain or pressure in the chest . New confusion . Inability to wake or stay awake . You cough up blood. . Your symptoms become more severe  **This list is not all possible symptoms. Contact your medical provider for any symptoms that are sever or concerning to you.   MAKE SURE YOU   Understand these instructions.  Will watch your condition.  Will get help right away if you are not doing well or get worse.  Your e-visit answers were reviewed by a board certified advanced clinical practitioner to complete your personal care plan.  Depending on the condition, your plan could have included both over the counter or prescription medications.  If there is a problem please reply once you have received a response from your provider.  Your safety is important to [place].  If you have drug allergies check your prescription carefully.    You can use MyChart to ask questions about today's visit, request a non-urgent call back, or ask for a work or school excuse for 24 hours related to this e-Visit. If it has been greater than 24 hours you will need to follow up with your provider, or enter a new e-Visit to address those concerns. You will get an e-mail in the next two days asking about your experience.  I hope that your e-visit has been valuable and will speed your recovery. Thank you for using e-visits.   5-10 minutes spent reviewing and documenting in chart.5-10 minutes spent reviewing and documenting in chart.

## 2019-03-14 ENCOUNTER — Other Ambulatory Visit: Payer: Self-pay

## 2019-03-14 DIAGNOSIS — Z20822 Contact with and (suspected) exposure to covid-19: Secondary | ICD-10-CM

## 2019-03-16 LAB — NOVEL CORONAVIRUS, NAA: SARS-CoV-2, NAA: NOT DETECTED

## 2019-03-17 ENCOUNTER — Other Ambulatory Visit: Payer: Self-pay | Admitting: Neurology

## 2019-04-03 ENCOUNTER — Telehealth: Payer: Self-pay

## 2019-04-03 NOTE — Telephone Encounter (Signed)
I called pt that [facility] sent [place] a fax that they are unable to reach her about refilling her Gilenya. Pt stated she did speak with [facility] and they will be mailing her the [place] prescription.

## 2019-04-03 NOTE — Telephone Encounter (Signed)
Noted  

## 2019-05-24 NOTE — Telephone Encounter (Signed)
Per Hilda Blades, she spoke w/ pt.

## 2019-05-29 ENCOUNTER — Encounter: Payer: Self-pay | Admitting: *Deleted

## 2019-05-31 ENCOUNTER — Telehealth: Payer: Self-pay | Admitting: *Deleted

## 2019-05-31 NOTE — Telephone Encounter (Signed)
Took call from phone staff and spoke with Mary Beck with [place] program. Pt reported to them that she lost insurance and only has 4 days left of Gilenya. She is going to apply for pt assistance. In the meantime, they are requesting temporary supply rx sent to them for 14-day supply with 3 refills until she can work out pt assistance. I had MD sign order and faxed back to them at [phone]. Received fax confirmation.

## 2019-06-04 ENCOUNTER — Other Ambulatory Visit: Payer: Self-pay | Admitting: *Deleted

## 2019-06-04 MED ORDER — SOLIFENACIN SUCCINATE 10 MG PO TABS: ORAL_TABLET | ORAL | 11 refills | Status: DC

## 2019-06-05 ENCOUNTER — Telehealth: Payer: Self-pay | Admitting: *Deleted

## 2019-06-05 NOTE — Telephone Encounter (Addendum)
Faxed completed/signed PAP application back to [employer] for [place] at [phone]. Received fax confirmation.   Gave completed form to medical records to process/mail copy to pt as requested.

## 2019-06-06 ENCOUNTER — Telehealth: Payer: Self-pay | Admitting: Neurology

## 2019-06-06 NOTE — Telephone Encounter (Signed)
[place] from [place] go Program called back states her and RN were disconnected. Please call back at [phone]

## 2019-06-06 NOTE — Telephone Encounter (Signed)
Took call from phone staff and spoke with [facility]. She transferred me to [place] and I spoke with Hassan Rowan who transferred me to pharmacist. Clarified directions should be take 1 capsule by mouth daily. Dispense 14 day supply of Gilenya 0.5mg  and if needed, up to 3 additional supplies of 14 days each for a max of a 56 day supply. She verbalized understanding, nothing further needed.

## 2019-06-06 NOTE — Telephone Encounter (Signed)
We did not get disconnected, she connected me to pharmacist and I provided VO for emergency supply of Gilenya.

## 2019-07-03 ENCOUNTER — Telehealth: Payer: Self-pay | Admitting: Neurology

## 2019-07-03 NOTE — Telephone Encounter (Signed)
Marcelino Duster from [employer] called stating that a start form needs to be faxed to Fax# [phone] Please advise.

## 2019-07-03 NOTE — Telephone Encounter (Signed)
Called, LVM for Mary Beck returning her call. Asked her to call back to see what start form is needed? Pt has been established on Gilenya. We sent back completed PAP to them on 06/05/2019 for Gilenya.

## 2019-07-04 NOTE — Telephone Encounter (Signed)
Received fax from [employer] that pt approved for patient assistance for Gilenya until 07/03/2020 at no out of pocket cost. Pt ID [id number]. Sent copy to epic to be scanned.  

## 2019-07-04 NOTE — Telephone Encounter (Signed)
Received fax from [employer] that pt approved for patient assistance for Gilenya until 07/03/2020 at no out of pocket cost. Pt ID [id number]. Sent copy to epic to be scanned.

## 2019-07-19 ENCOUNTER — Encounter: Payer: Self-pay | Admitting: Neurology

## 2019-07-19 ENCOUNTER — Ambulatory Visit: Payer: 59 | Admitting: Neurology

## 2019-07-19 VITALS — BP 142/93 | HR 108 | Temp 97.6°F | Ht 63.0 in | Wt 191.0 lb

## 2019-07-19 DIAGNOSIS — M7138 Other bursal cyst, other site: Secondary | ICD-10-CM | POA: Diagnosis not present

## 2019-07-19 DIAGNOSIS — Q057 Lumbar spina bifida without hydrocephalus: Secondary | ICD-10-CM | POA: Insufficient documentation

## 2019-07-19 DIAGNOSIS — G35 Multiple sclerosis: Secondary | ICD-10-CM

## 2019-07-19 DIAGNOSIS — G5601 Carpal tunnel syndrome, right upper limb: Secondary | ICD-10-CM | POA: Insufficient documentation

## 2019-07-19 DIAGNOSIS — R350 Frequency of micturition: Secondary | ICD-10-CM

## 2019-07-19 DIAGNOSIS — F09 Unspecified mental disorder due to known physiological condition: Secondary | ICD-10-CM

## 2019-07-19 DIAGNOSIS — Z79899 Other long term (current) drug therapy: Secondary | ICD-10-CM | POA: Diagnosis not present

## 2019-07-19 DIAGNOSIS — R5383 Other fatigue: Secondary | ICD-10-CM

## 2019-07-19 MED ORDER — PHENTERMINE HCL 37.5 MG PO CAPS: 37.5000 mg | ORAL_CAPSULE | ORAL | 1 refills | Status: DC

## 2019-07-19 NOTE — Progress Notes (Signed)
[facility]  PATIENT: Mary Beck DOB: 1975-07-01   _________________________________   HISTORICAL  CHIEF COMPLAINT:  Chief Complaint  Patient presents with  . Follow-up    RM 12, alone. Last seen 12/2018  . Multiple Sclerosis    On Gilenya    HISTORY OF PRESENT ILLNESS:  Mary Beck is a 44 y.o. woman with relapsing remitting MS diagnosed in 2010.    Update 07/19/2019: She is tolerating Gilenya well for her RRMS.   She has no exacerbations though has noted some numbness in her right hand at times.    Gait is fine ans she can go downstairs without holding hte bannister.   Strength is fine.    She denies numbness/dysesthesias in legs.   Bladder function is much better on solifenacin   Vision is fine.  She has fatigue daily which is helped by phentermine.   She has also lost about 5 pounds. Mood is ok with Wellbutrin but she still has mild depression.   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   She has difficulty with processing and word finding.   She has had trouble with executive function tasks.   She takes zolpidem rarely fpr insomnia.      She saw Norton Pastel, Psychologist for neurocognitive evaluation in 12/18/2015: Overall, Mary Beck's neurocognitive profile is consistent with a mild neurocognitive disorder (i.e., MCI), predominantly dysexecutive, with the combination of her multiple sclerosis and mood symptomology as likely etiologies.      Update 01/16/19: She feels her MS is stable and has no recent exacerbations.   She is tolerating GIlenya.  Her last MRI was 10/10/2017 and showed no new lesions in the brain compared to the 2017 MRI.  She is walking well.    No recent falls.    She feels urinary urgency is much better on Myrbetriq than ditropan alone.      Vision is fine.  She has fatigue most days, actually better on work days than weekends.    She is sleeping well.   She notes mood is  doing well and she take Wellbutrin 300 mg daily.  Cognition is foggy at times though Adderall has helped her focus.      Weight was 173 before surgery and is 205 today (200 06/2018).      In general she is less active since her back surgery.    She takes Adderall.      Update  07/17/2018: She is on Gilenya and tolerates it well.  No exacerbations.  No new MS symptoms.    Her gait is doing well.  MRI of the brain  09/2017 showed no new lesions.  She has two lesions in T-spine that looked chronicShe has no trouble with stairs    She denies numbness or weakness in legs.    Vision is doing well and no color desaturation.    She has urinary frequency and some urge incontinence.     Ditropan helps some but not as much at night.   It makes her mouth dry if she takes 2 so most days she just takes one.   Fatigue is a problem and she does fewer activities because of it.  She is still working Consulting civil engineer, order processing, lab sample coordination).      She is using more Ambien to help her fall asleep (5 mg 3-4 times a week).   Cognition is ok, with improved focus and attention  on Adderall.    Mood is ok.  She is on Cymbalta.     She had decompression for a tethered cord  Update 09/07/2017:    She notes a pinching sensation in the right buttock/lower back that radiates some into the right leg to mid-thigh, worsening the last couple months.   Standing a long time or walking increases the pain.  She notes the leg will give out when the pain is worse but not otherwise.    She does not note numbness in the leg.    She had surgery for a tethered cord, diastematomyelia and spina bifida    She also had a right L5-S1 synovial cyst resection and L2L3-L5S1 decompressive laminectomy  She feels her MS is mostly stable.   She is on Gilenya and she tolerates it well.  Her gait is sometimes a little off balance but she has no recent falls.  Her bladder has done better since her lumbar surgery.   Vision is doing well.    Her  fatigue is a problem.   She is sleeping ok most nights and no longer uses Ambien.     Mood is reduced with mild depression.  Cognition is fine.      From 03/09/2017 Lower back pain:   She had surgery 05/2016 in [facility].   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   She feels left leg weakness and pain is much better now.    Bladder function also improved.    MS:  She feels stable and has no new exacerbations.   She is on Gilenya and tolerates it well.  She likes it much better than Copaxone or Betaseron that she had been on in the past.    Her 11/10/2015 MRI was stable.     Gait/strength/senation:  Gait is doing well.    Balance is good.   She feels gait improved with surgery.   She has right hand numbness (old) but no weakness or other numbness.   Vision:   She denies any current visual symptoms. However, she did have pseudotumor cerebri about 15 or 20 years ago and had serial LP's and Diamox.  She improved after weight loss.  Bladder/bowel:  Bladder is also much better since surgery.   She is on oxybutynin with benefit.   She has only had a couple episodes of incontinence, both with urge first.  Fatigue/sleep:   This is stable .   She has fatigue, worse with the heat.  Her sleep onset insomnia is much better and she has only taken one Ambien in the last 2 months.   Adderall 20 mg once or twice a day has helped her fatigue during the day  Mood/cognition:  She feels her mood is doing fairly well in general. She still has mild depression at times. She tolerates  it well. She still notes some difficulties with focus and attention. Adderall has helped this.  MS HIstory:   She was diagnosed in 2010 after presenting with numbness in the feet up to her knees. She had an MRI of the brain that was abnormal. This was followed by a lumbar puncture in the CSF was also abnormal. She was diagnosed with MS and was placed on Copaxone. She had an exacerbation and was switched to  Betaseron. Due to needle fatigue she was switched to Gilenya about 2 years ago. Since being on Gilenya her MS has been stable. She denies any exacerbations. She tolerates  Gilenya well. She had an MRI of the brain early 2015 that was unchanged compared to her previous MRI.   MRI May 2017 shows a new large chronic focus in the left frontal lobe not present in 2015. MRI of the cervical spine shows 3 T2 hyperintense foci  MRI brain 10/10/2017: 1.   Multiple T2/FLAIR hyperintense foci in the periventricular, juxtacortical and deep white matter of both hemispheres and in the right cerebellar hemisphere in a pattern and configuration consistent with chronic demyelinating plaque.  None of the foci appears to be acute.  When compared to the MRI dated  11/10/2015, there are no new lesions and 1 focus in the left frontal lobe appears much smaller on the current study.   2.    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 3.   There are no acute findings  MRI cervical 10/10/2017: IMPRESSION: This MRI of the cervical spine with and without contrast shows the following: 1.    There are several foci within the spinal cord adjacent to C2, C4-C5, C5 and C7 consistent with chronic demyelinating plaque associated with multiple sclerosis.  All these foci were noted on the 11/10/2015 MRI and they do not enhance. 2.    At C3-C4 there is a small left paramedian disc bulge not present on the 2017 MRI.  There is no nerve root compression. 3.    At C5-C6 there is stable disc bulging and mild bilateral foraminal narrowing but no nerve root compression. 4.    At C6-C7, there is stable disc protrusion and mild to moderate foraminal narrowing and mild spinal stenosis.  There is no nerve root compression. 5.    There is a normal enhancement pattern and there are no acute findings.Marland Kitchen      MRI thoracic 10/10/2017: IMPRESSION: This MRI  of the thoracic spine without contrast shows the following: 1.    Anterior to central focus within the spinal cord adjacent to T8 and central focus within the spinal cord adjacent to T11.  These are nonspecific but consistent with transverse myelitis or demyelination.. 2.    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    Posterior decompression from L3-S1. 2.    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  This is unchanged when compared to the previous MRI. 3.    At L4-L5, there is left greater than right facet hypertrophy causing moderate left foraminal narrowing.  There is no definite nerve root compression though there is some encroachment upon the left L4 nerve root.  This appears stable. 4.    At L5-S1, there is severe right greater than left facet hypertrophy and a right synovial cyst causing severe right lateral recess stenosis.  This could lead to right S1 nerve root compression.  Changes appear stable compared to the 08/31/2016 MRI. 5.    There is mild epidural fibrosis to the right at L4-L5 and L5-S1.   MRI Brain 11/10/15 IMPRESSION: This MRI of the brain with and without contrast shows the following: 1. Scattered T2/FLAIR hyperintense foci in the hemispheres and right cerebellum in a pattern and configuration consistent with chronic demyelinating plaque associated with multiple sclerosis. None of the foci appears to be acute. However, when compared to the MRI dated  11/07/2013, one left frontal subcortical focus has occurred during the interim. 2, There are no acute findings.  MRi Cervical 11/10/15 IMPRESSION: This MRI of the cervical spine with and without contrast shows the following: 1. There are three T2 hyperintense foci within the spinal cord as detailed above consistent with chronic demyelinating plaque  associated with multiple sclerosis. None of the foci enhances after contrast administration. 2. Mild degenerative changes at C5-C6 and C6-C7 that did not lead to any nerve root impingement. 3. There is a normal enhancement pattern.  2017   (pre-surgical) 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 2. Posterior element fusion anomaly/spina bifida at L3 and below. 3. Advanced facet disease in the lower lumbar spine. 4. 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    REVIEW OF SYSTEMS: Constitutional: No fevers, chills, sweats, or change in appetite.   She reports fatigue Eyes: No visual changes, double vision, .  However, mild eye pain Ear, nose and throat: No hearing loss, ear pain, nasal congestion, sore throat Cardiovascular: No chest pain, palpitations Respiratory: No shortness of breath at rest or with exertion.   No wheezes GastrointestinaI: No nausea, vomiting, diarrhea, abdominal pain, fecal incontinence Genitourinary: No dysuria or urinary retention.  She notes frequency and nocturia. Musculoskeletal: No neck pain.  Notes back pain Integumentary: No rash, pruritus, skin lesions Neurological: as above.  Also, occasional Headache Psychiatric: Reports mild depression but little anxiety Endocrine: No palpitations, diaphoresis, change in appetite, change in weigh or increased thirst Hematologic/Lymphatic: No anemia, purpura, petechiae. Allergic/Immunologic: No itchy/runny eyes, nasal congestion, recent allergic reactions, rashes  ALLERGIES: No Known Allergies  HOME MEDICATIONS:  Current Outpatient Medications:  .  buPROPion (WELLBUTRIN XL) 300 MG 24 hr tablet, TAKE 1 TABLET BY MOUTH DAILY, Disp: 30 tablet, Rfl: 5 .  cholecalciferol (VITAMIN D) 1000 UNITS tablet, Take 50,000 Units by mouth daily.  Take 1 capsules daily, Disp: , Rfl:  .  Fingolimod HCl (GILENYA) 0.5 MG CAPS, Take 1 capsule (0.5 mg total) by mouth daily., Disp: 90 capsule, Rfl: 3 .  ibuprofen (ADVIL,MOTRIN) 800 MG tablet, Take 1 tablet (800 mg total) by mouth every 8 (eight) hours as needed for mild pain., Disp: 30 tablet, Rfl: 0 .  loratadine (CLARITIN) 10 MG tablet, Take 10 mg by mouth daily., Disp: , Rfl:  .  PROAIR HFA 108 (90 Base) MCG/ACT inhaler, Inhale 2 puffs into the lungs every 6 (six) hours as needed for wheezing or shortness of breath., Disp: 6.7 g, Rfl: 1 .  solifenacin (VESICARE) 10 MG tablet, Take 1 tablet by mouth daily, Disp: 30 tablet, Rfl: 11 .  zolpidem (AMBIEN) 5 MG tablet, Take 1 tablet (5 mg total) by mouth at bedtime as needed for sleep., Disp: 30 tablet, Rfl: 5 .  phentermine 37.5 MG capsule, Take 1 capsule (37.5 mg total) by mouth every morning., Disp: 90 capsule, Rfl: 1  PAST MEDICAL HISTORY: Past Medical History:  Diagnosis Date  . Headache   . Multiple sclerosis (HCC)   . Pseudotumor cerebri   . Spina bifida (HCC)     PAST SURGICAL HISTORY: Past Surgical History:  Procedure Laterality Date  . BACK SURGERY  2017    FAMILY HISTORY: Family History  Problem Relation Age of Onset  . Hypertension Mother   . Thyroid disease Mother   .  Hypertension Father   . Thyroid disease Sister   . Cancer Maternal Grandmother        brain  . Heart disease Maternal Grandfather   . Multiple sclerosis Neg Hx     SOCIAL HISTORY:  Social History   Socioeconomic History  . Marital status: Single    Spouse name: Not on file  . Number of children: Not on file  . Years of education: Not on file  . Highest education level: Not on file  Occupational History  . Not on file  Tobacco Use  . Smoking status: Never Smoker  . Smokeless tobacco: Never Used  Substance and Sexual Activity  . Alcohol use: No    Alcohol/week: 0.0 standard drinks  . Drug use: No  . Sexual activity: Yes    Birth  control/protection: None  Other Topics Concern  . Not on file  Social History Narrative  . Not on file   Social Determinants of Health   Financial Resource Strain:   . Difficulty of Paying Living Expenses: Not on file  Food Insecurity:   . Worried About Programme researcher, broadcasting/film/video in the Last Year: Not on file  . Ran Out of Food in the Last Year: Not on file  Transportation Needs:   . Lack of Transportation (Medical): Not on file  . Lack of Transportation (Non-Medical): Not on file  Physical Activity:   . Days of Exercise per Week: Not on file  . Minutes of Exercise per Session: Not on file  Stress:   . Feeling of Stress : Not on file  Social Connections:   . Frequency of Communication with Friends and Family: Not on file  . Frequency of Social Gatherings with Friends and Family: Not on file  . Attends Religious Services: Not on file  . Active Member of Clubs or Organizations: Not on file  . Attends Banker Meetings: Not on file  . Marital Status: Not on file  Intimate Partner Violence:   . Fear of Current or Ex-Partner: Not on file  . Emotionally Abused: Not on file  . Physically Abused: Not on file  . Sexually Abused: Not on file     PHYSICAL EXAM  Vitals:   07/19/19 0841  BP: (!) 142/93  Pulse: (!) 108  Temp: 97.6 F (36.4 C)  Weight: 191 lb (86.6 kg)  Height:  (1.6 m)    Body mass index is 33.83 kg/m.   General: The patient is well-developed and well-nourished and in no acute distress    Neurologic Exam  Mental status: The patient is alert and oriented x 3 at the time of the examination. The patient has apparent normal recent and remote memory, with an apparently normal attention span and concentration ability.   Speech is normal.  Cranial nerves: Extraocular movements are full.     Facial symmetry is present. There is good facial sensation to soft touch bilaterally.Facial strength is normal.  Trapezius and sternocleidomastoid strength is  normal. No dysarthria is noted.    No obvious hearing deficits are noted.  Motor:  Muscle bulk is normal.   Tone is normal. Strength is  5 / 5 in all 4 extremities .   Sensory: She has mildly reduced touch and vibration sensation in the right hand compared to the left hand. Leg sensation was normal.   Coordination: Cerebellar testing reveals good finger-nose-finger and heel-to-shin bilaterally.  Gait and station: Station is normal.   Her gait is normal.  The  tandem gait is mildly wide.  Romberg is negative.   Reflexes: Deep tendon reflexes are normal and symmetric in the arms.  Deep tendon reflexes were increased at the knees.  There is a crossed adductor response on the left.  Asymmetry at the ankles, larger reflex on the left.    ASSESSMENT AND PLAN  1. Multiple sclerosis (HCC)   2. High risk medication use   3. Spina bifida of lumbosacral region without hydrocephalus (HCC)   4. Synovial cyst of lumbar facet joint   5. Increased frequency of urination   6. Other fatigue   7. Cognitive deficit secondary to multiple sclerosis (HCC)   8. Right carpal tunnel syndrome      1.   Continue Gilenya.  Check CBC with differential.  Check vitamin D 2.    Continue to be active and exercises as tolerated.  3.    Continue gabapentin and and etodolac (change to prn now). 4.   Change Adderall to phentermine as it may continue to help her focus and attention but help weight loss better with a longer half-life 5.    RTC 5-6 months or sooner if new or worsening neurologic problems.   Lesley Galentine A. Epimenio Foot, MD, PhD 07/19/2019, 9:25 AM Certified in Neurology, Clinical Neurophysiology, Sleep Medicine, Pain Medicine and Neuroimaging  [facility] [address] [place], [place] [address] [phone]  -       [facility]  PATIENT: Mary Beck DOB: [birth year]   _________________________________   HISTORICAL  CHIEF COMPLAINT:  Chief  Complaint  Patient presents with  . Follow-up    RM 12, alone. Last seen 12/2018  . Multiple Sclerosis    On Gilenya    HISTORY OF PRESENT ILLNESS:  Tata Timmins is a 88 -year-old woman who was diagnosed with MS in 2010.      Lower back pain:   She had surgery 05/2016 in [facility].   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   She feels left leg weakness and pain is much better now.    Bladder function also improved.    MS:  She feels stable and has no new exacerbations.   She is on Gilenya and tolerates it well.  She likes it much better than Copaxone or Betaseron that she had been on in the past.    Her 11/10/2015 MRI was stable.     Gait/strength/senation:  Gait is doing well.    Balance is good.   She feels gait improved with surgery.   She has right hand numbness (old) but no weakness or other numbness.   Vision:   She denies any current visual symptoms. However, she did have pseudotumor cerebri about 15 or 20 years ago and had serial LP's and Diamox.  She improved after weight loss.  Bladder/bowel:  Bladder is also much better since surgery.   She is on oxybutynin with benefit.   She has only had a couple episodes of incontinence, both with urge first.  Fatigue/sleep:   This is stable .   She has fatigue, worse with the heat.  Her sleep onset insomnia is much better and she has only taken one Ambien in the last 2 months.   Adderall 20 mg once or twice a day has helped her fatigue during the day  Mood/cognition:  She feels her mood is doing fairly well in general. She still has mild depression at times. She tolerates  it well. She still notes some difficulties with focus and attention. Adderall has helped this.  MS HIstory:   She was diagnosed in 2010 after presenting with numbness in the feet up to her knees. She had an MRI of the brain that was abnormal. This was followed by a lumbar puncture in the CSF was also abnormal. She was diagnosed with MS and was  placed on Copaxone. She had an exacerbation and was switched to Betaseron. Due to needle fatigue she was switched to Gilenya about 2 years ago. Since being on Gilenya her MS has been stable. She denies any exacerbations. She tolerates Gilenya well. She had an MRI of the brain early 2015 that was unchanged compared to her previous MRI.   MRI May 2017 shows a new large chronic focus in the left frontal lobe not present in 2015. MRI of the cervical spine shows 3 T2 hyperintense foci  MRI Brain 11/10/15 IMPRESSION: This MRI of the brain with and without contrast shows the following: 1. Scattered T2/FLAIR hyperintense foci in the hemispheres and right cerebellum in a pattern and configuration consistent with chronic demyelinating plaque associated with multiple sclerosis. None of the foci appears to be acute. However, when compared to the MRI dated 11/07/2013, one left frontal subcortical focus has occurred during the interim. 2, There are no acute findings.  MRi Cervical 11/10/15 IMPRESSION: This MRI of the cervical spine with and without contrast shows the following: 1. There are three T2 hyperintense foci within the spinal cord as detailed above consistent with chronic demyelinating plaque associated with multiple sclerosis. None of the foci enhances after contrast administration. 2. Mild degenerative changes at C5-C6 and C6-C7 that did not lead to any nerve root impingement. 3. There is a normal enhancement pattern.  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 2. Posterior element fusion anomaly/spina bifida at L3 and below. 3. Advanced facet disease in the lower lumbar spine. 4. 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     REVIEW OF SYSTEMS: Constitutional: No fevers, chills, sweats, or change in  appetite.   She reports fatigue Eyes: No visual changes, double vision, .  However, mild eye pain Ear, nose and throat: No hearing loss, ear pain, nasal congestion, sore throat Cardiovascular: No chest pain, palpitations Respiratory: No shortness of breath at rest or with exertion.   No wheezes GastrointestinaI: No nausea, vomiting, diarrhea, abdominal pain, fecal incontinence Genitourinary: No dysuria or urinary retention.  She notes frequency and nocturia. Musculoskeletal: No neck pain.  Notes back pain Integumentary: No rash, pruritus, skin lesions Neurological: as above.  Also, occasional Headache Psychiatric: Reports mild depression but little anxiety Endocrine: No palpitations, diaphoresis, change in appetite, change in weigh or increased thirst Hematologic/Lymphatic: No anemia, purpura, petechiae. Allergic/Immunologic: No itchy/runny eyes, nasal congestion, recent allergic reactions, rashes  ALLERGIES: No Known Allergies  HOME MEDICATIONS:  Current Outpatient Medications:  .  buPROPion (WELLBUTRIN XL) 300 MG 24 hr tablet, TAKE 1 TABLET BY MOUTH DAILY, Disp: 30 tablet, Rfl: 5 .  cholecalciferol (VITAMIN D) 1000 UNITS tablet, Take 50,000 Units by mouth daily. Take 1 capsules daily, Disp: , Rfl:  .  Fingolimod HCl (GILENYA) 0.5 MG CAPS, Take 1 capsule (0.5 mg total) by mouth daily., Disp: 90 capsule, Rfl: 3 .  ibuprofen (ADVIL,MOTRIN) 800 MG tablet, Take 1  tablet (800 mg total) by mouth every 8 (eight) hours as needed for mild pain., Disp: 30 tablet, Rfl: 0 .  loratadine (CLARITIN) 10 MG tablet, Take 10 mg by mouth daily., Disp: , Rfl:  .  PROAIR HFA 108 (90 Base) MCG/ACT inhaler, Inhale 2 puffs into the lungs every 6 (six) hours as needed for wheezing or shortness of breath., Disp: 6.7 g, Rfl: 1 .  solifenacin (VESICARE) 10 MG tablet, Take 1 tablet by mouth daily, Disp: 30 tablet, Rfl: 11 .  zolpidem (AMBIEN) 5 MG tablet, Take 1 tablet (5 mg total) by mouth at bedtime as needed  for sleep., Disp: 30 tablet, Rfl: 5 .  phentermine 37.5 MG capsule, Take 1 capsule (37.5 mg total) by mouth every morning., Disp: 90 capsule, Rfl: 1  PAST MEDICAL HISTORY: Past Medical History:  Diagnosis Date  . Headache   . Multiple sclerosis (HCC)   . Pseudotumor cerebri   . Spina bifida (HCC)     PAST SURGICAL HISTORY: Past Surgical History:  Procedure Laterality Date  . BACK SURGERY  2017    FAMILY HISTORY: Family History  Problem Relation Age of Onset  . Hypertension Mother   . Thyroid disease Mother   . Hypertension Father   . Thyroid disease Sister   . Cancer Maternal Grandmother        brain  . Heart disease Maternal Grandfather   . Multiple sclerosis Neg Hx     SOCIAL HISTORY:  Social History   Socioeconomic History  . Marital status: Single    Spouse name: Not on file  . Number of children: Not on file  . Years of education: Not on file  . Highest education level: Not on file  Occupational History  . Not on file  Tobacco Use  . Smoking status: Never Smoker  . Smokeless tobacco: Never Used  Substance and Sexual Activity  . Alcohol use: No    Alcohol/week: 0.0 standard drinks  . Drug use: No  . Sexual activity: Yes    Birth control/protection: None  Other Topics Concern  . Not on file  Social History Narrative  . Not on file   Social Determinants of Health   Financial Resource Strain:   . Difficulty of Paying Living Expenses: Not on file  Food Insecurity:   . Worried About Programme researcher, broadcasting/film/video in the Last Year: Not on file  . Ran Out of Food in the Last Year: Not on file  Transportation Needs:   . Lack of Transportation (Medical): Not on file  . Lack of Transportation (Non-Medical): Not on file  Physical Activity:   . Days of Exercise per Week: Not on file  . Minutes of Exercise per Session: Not on file  Stress:   . Feeling of Stress : Not on file  Social Connections:   . Frequency of Communication with Friends and Family: Not on file   . Frequency of Social Gatherings with Friends and Family: Not on file  . Attends Religious Services: Not on file  . Active Member of Clubs or Organizations: Not on file  . Attends Banker Meetings: Not on file  . Marital Status: Not on file  Intimate Partner Violence:   . Fear of Current or Ex-Partner: Not on file  . Emotionally Abused: Not on file  . Physically Abused: Not on file  . Sexually Abused: Not on file     PHYSICAL EXAM  Vitals:   07/19/19 0841  BP: (!) 142/93  Pulse: (!) 108  Temp: 97.6 F (36.4 C)  Weight: 191 lb (86.6 kg)  Height:  (1.6 m)    Body mass index is 33.83 kg/m.   General: The patient is well-developed and well-nourished and in no acute distress.   There is tenderness over the lower lumbar spine and right piriformis muscle    Neurologic Exam  Mental status: The patient is alert and oriented x 3 at the time of the examination. The patient has apparent normal recent and remote memory, with an apparently normal attention span and concentration ability.   Speech is normal.  Cranial nerves: Extraocular movements are full.    Facial strength and sensation is normal.  Trapezius strength is normal.  L. No dysarthria is noted.    No obvious hearing deficits are noted.  Motor:  Muscle bulk is normal.   Tone is normal. Strength is  5 / 5 in all 4 extremities .   Sensory: She had symmetric sensation in the legs.   Reduced sensation over thenar eminence and Tinels sign at that side  Coordination: Cerebellar testing reveals good finger-nose-finger and heel-to-shin bilaterally.  Gait and station: Station is normal.   The gait is fairly normal but tandem gait is wide.   There is no Romberg sign..    Reflexes: Deep tendon reflexes are normal and symmetric in the arms.  Increased DTRs at knees with spread.  The right ankle reflex is reduced compared to the left.       DIAGNOSTIC DATA (LABS, IMAGING, TESTING) - I reviewed patient records,  labs, notes, testing and imaging myself where available.     ASSESSMENT AND PLAN  Multiple sclerosis (HCC) - Plan: CBC with Differential/Platelet, Comprehensive metabolic panel  High risk medication use - Plan: CBC with Differential/Platelet, Comprehensive metabolic panel  Spina bifida of lumbosacral region without hydrocephalus (HCC)  Synovial cyst of lumbar facet joint  Increased frequency of urination  Other fatigue  Cognitive deficit secondary to multiple sclerosis (HCC)  Right carpal tunnel syndrome    1.   Continue Gilenya.  We will check a CBC with differential today.   2.   Vesicare for bladder dysfunction 3.   Stay active and exercise  4.    Continue phentermine 5.    Due to her cognitive and physical issues and fatigue related to MS, she is disabled.. 6.   Writ splint for CTS.  If that worsens, check NCV/EMG 7.   RTC  6 months or sooner if new or worsening neurologic problems.   Karren Newland A. Epimenio Foot, MD, PhD 07/19/2019, 9:25 AM Certified in Neurology, Clinical Neurophysiology, Sleep Medicine, Pain Medicine and Neuroimaging  [facility] [address] [place], [place] [address] [phone]  -

## 2019-07-20 LAB — CBC WITH DIFFERENTIAL/PLATELET
Basophils Absolute: 0 10*3/uL (ref 0.0–0.2)
Basos: 0 %
EOS (ABSOLUTE): 0 10*3/uL (ref 0.0–0.4)
Eos: 1 %
Hematocrit: 40.2 % (ref 34.0–46.6)
Hemoglobin: 13.2 g/dL (ref 11.1–15.9)
Immature Grans (Abs): 0 10*3/uL (ref 0.0–0.1)
Immature Granulocytes: 1 %
Lymphocytes Absolute: 0.3 10*3/uL — ABNORMAL LOW (ref 0.7–3.1)
Lymphs: 5 %
MCH: 30.2 pg (ref 26.6–33.0)
MCHC: 32.8 g/dL (ref 31.5–35.7)
MCV: 92 fL (ref 79–97)
Monocytes Absolute: 0.6 10*3/uL (ref 0.1–0.9)
Monocytes: 11 %
Neutrophils Absolute: 4.4 10*3/uL (ref 1.4–7.0)
Neutrophils: 82 %
Platelets: 304 10*3/uL (ref 150–450)
RBC: 4.37 x10E6/uL (ref 3.77–5.28)
RDW: 13.8 % (ref 11.7–15.4)
WBC: 5.4 10*3/uL (ref 3.4–10.8)

## 2019-07-20 LAB — COMPREHENSIVE METABOLIC PANEL
ALT: 15 IU/L (ref 0–32)
AST: 14 IU/L (ref 0–40)
Albumin/Globulin Ratio: 1.9 (ref 1.2–2.2)
Albumin: 3.9 g/dL (ref 3.8–4.8)
Alkaline Phosphatase: 76 IU/L (ref 39–117)
BUN/Creatinine Ratio: 12 (ref 9–23)
BUN: 10 mg/dL (ref 6–24)
Bilirubin Total: 0.2 mg/dL (ref 0.0–1.2)
CO2: 26 mmol/L (ref 20–29)
Calcium: 8.9 mg/dL (ref 8.7–10.2)
Chloride: 104 mmol/L (ref 96–106)
Creatinine, Ser: 0.84 mg/dL (ref 0.57–1.00)
GFR calc Af Amer: 97 mL/min/{1.73_m2} (ref >59)
GFR calc non Af Amer: 84 mL/min/{1.73_m2} (ref >59)
Globulin, Total: 2.1 g/dL (ref 1.5–4.5)
Glucose: 78 mg/dL (ref 65–99)
Potassium: 4.2 mmol/L (ref 3.5–5.2)
Sodium: 142 mmol/L (ref 134–144)
Total Protein: 6 g/dL (ref 6.0–8.5)

## 2019-08-30 ENCOUNTER — Other Ambulatory Visit: Payer: Self-pay | Admitting: Neurology

## 2019-09-01 ENCOUNTER — Other Ambulatory Visit: Payer: Self-pay | Admitting: Neurology

## 2020-01-16 ENCOUNTER — Ambulatory Visit: Payer: 59 | Admitting: Family Medicine

## 2020-02-26 ENCOUNTER — Other Ambulatory Visit: Payer: Self-pay | Admitting: Neurology

## 2020-03-27 ENCOUNTER — Other Ambulatory Visit: Payer: Self-pay | Admitting: Neurology

## 2020-04-02 ENCOUNTER — Other Ambulatory Visit: Payer: Self-pay | Admitting: Neurology

## 2020-04-02 NOTE — Progress Notes (Signed)
Chief Complaint  Patient presents with  . Follow-up    6 month f/u, states she has doing well since last visit. Denies any new falls.   . room 2    alone     HISTORY OF PRESENT ILLNESS: Today 04/03/20  Mary Beck is a 44 y.o. female here today for follow up for RRMS. She continues Gilenya. Last MR brian, cervical, thoracic and lumbar spine in 09/2017 were stable.   She feels that she is doing about the same. No major changes in gait, strength or vision. She feels she needs an updated eye exam as it is more difficult to see small print. She is using readers. Back pain is unchanged and manageable.   Mood is stable on Wellbutrin. She is sleeping well. She rarely uses Ambien. She has not taken phentermine recently. She does not feel it helped. She was previously taking Adderall but didn't feel it helped either.   Vesicare is not working as well. She is having more accidents, urinary frequency and urgency. She feels that symptoms were better managed with oxybutynin and Mybetriq.   She continues Vit d supplements OTC.   HISTORY (copied from Mary Beck note on 07/19/2019)  Mary Beck is a 44 y.o. woman with relapsing remitting MS diagnosed in 2010.    Update 07/19/2019: She is tolerating Gilenya well for her RRMS.   She has no exacerbations though has noted some numbness in her right hand at times.    Gait is fine ans she can go downstairs without holding hte bannister.   Strength is fine.    She denies numbness/dysesthesias in legs.   Bladder function is much better on solifenacin   Vision is fine.  She has fatigue daily which is helped by phentermine.   She has also lost about 5 pounds. Mood is ok with Wellbutrin but she still has mild depression.   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   She has difficulty with processing and word finding.   She has had trouble with executive function tasks.   She takes zolpidem rarely  fpr insomnia.      She saw Mary Beck, Psychologist for neurocognitive evaluation in 12/18/2015: Overall, Mary Beck's neurocognitive profile is consistent with a mild neurocognitive disorder (i.e., MCI), predominantly dysexecutive, with the combination of her multiple sclerosis and mood symptomology as likely etiologies.     Update 01/16/19: She feels her MS is stable and has no recent exacerbations.   She is tolerating GIlenya.  Her last MRI was 10/10/2017 and showed no new lesions in the brain compared to the 2017 MRI.  She is walking well.    No recent falls.    She feels urinary urgency is much better on Myrbetriq than ditropan alone.      Vision is fine.  She has fatigue most days, actually better on work days than weekends.    She is sleeping well.   She notes mood is doing well and she take Wellbutrin 300 mg daily.  Cognition is foggy at times though Adderall has helped her focus.      Weight was 173 before surgery and is 205 today (200 06/2018).      In general she is less active since her back surgery.    She takes Adderall.      Update  07/17/2018: She is on Gilenya and tolerates it well.  No exacerbations.  No new MS symptoms.  Her gait is doing well.  MRI of the brain  09/2017 showed no new lesions.  She has two lesions in T-spine that looked chronicShe has no trouble with stairs    She denies numbness or weakness in legs.    Vision is doing well and no color desaturation.    She has urinary frequency and some urge incontinence.     Ditropan helps some but not as much at night.   It makes her mouth dry if she takes 2 so most days she just takes one.   Fatigue is a problem and she does fewer activities because of it.  She is still working IT trainer, order processing, lab sample coordination).      She is using more Ambien to help her fall asleep (5 mg 3-4 times a week).   Cognition is ok, with improved focus and attention on Adderall.    Mood is ok.  She is on Cymbalta.      She had decompression for a tethered cord  Update 09/07/2017:    She notes a pinching sensation in the right buttock/lower back that radiates some into the right leg to mid-thigh, worsening the last couple months.   Standing a long time or walking increases the pain.  She notes the leg will give out when the pain is worse but not otherwise.    She does not note numbness in the leg.    She had surgery for a tethered cord, diastematomyelia and spina bifida    She also had a right L5-S1 synovial cyst resection and L2L3-L5S1 decompressive laminectomy  She feels her MS is mostly stable.   She is on Gilenya and she tolerates it well.  Her gait is sometimes a little off balance but she has no recent falls.  Her bladder has done better since her lumbar surgery.   Vision is doing well.    Her fatigue is a problem.   She is sleeping ok most nights and no longer uses Ambien.     Mood is reduced with mild depression.  Cognition is fine.      From 03/09/2017 Lower back pain:   She had surgery 05/2016 in [facility].   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   She feels left leg weakness and pain is much better now.    Bladder function also improved.    MS:  She feels stable and has no new exacerbations.   She is on Gilenya and tolerates it well.  She likes it much better than Copaxone or Betaseron that she had been on in the past.    Her 11/10/2015 MRI was stable.     Gait/strength/senation:  Gait is doing well.    Balance is good.   She feels gait improved with surgery.   She has right hand numbness (old) but no weakness or other numbness.   Vision:   She denies any current visual symptoms. However, she did have pseudotumor cerebri about 15 or 20 years ago and had serial LP's and Diamox.  She improved after weight loss.  Bladder/bowel:  Bladder is also much better since surgery.   She is on oxybutynin with benefit.   She has only had a couple episodes of incontinence, both with  urge first.  Fatigue/sleep:   This is stable .   She has fatigue, worse with the heat.  Her sleep onset insomnia is much better and she has only taken one Ambien  in the last 2 months.   Adderall 20 mg once or twice a day has helped her fatigue during the day  Mood/cognition:  She feels her mood is doing fairly well in general. She still has mild depression at times. She tolerates  it well. She still notes some difficulties with focus and attention. Adderall has helped this.  MS HIstory:   She was diagnosed in 2010 after presenting with numbness in the feet up to her knees. She had an MRI of the brain that was abnormal. This was followed by a lumbar puncture in the CSF was also abnormal. She was diagnosed with MS and was placed on Copaxone. She had an exacerbation and was switched to Betaseron. Due to needle fatigue she was switched to Gilenya about 2 years ago. Since being on Gilenya her MS has been stable. She denies any exacerbations. She tolerates Gilenya well. She had an MRI of the brain early 2015 that was unchanged compared to her previous MRI.   MRI May 2017 shows a new large chronic focus in the left frontal lobe not present in 2015. MRI of the cervical spine shows 3 T2 hyperintense foci  MRI brain 10/10/2017: 1. Multiple T2/FLAIR hyperintense foci in the periventricular, juxtacortical and deep white matter of both hemispheres and in the right cerebellar hemisphere in a pattern and configuration consistent with chronic demyelinating plaque. None of the foci appears to be acute. When compared to the MRI dated 11/10/2015, there are no new lesions and 1 focus in the left frontal lobe appears much smaller on the current study.  2. 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 3. There are no acute findings  MRI cervical 10/10/2017: IMPRESSION: This MRI of the cervical spine with and  without contrast shows the following: 1. There are several foci within the spinal cord adjacent to C2, C4-C5, C5 and C7 consistent with chronic demyelinating plaque associated with multiple sclerosis. All these foci were noted on the 11/10/2015 MRI and they do not enhance. 2. At C3-C4 there is a small left paramedian disc bulge not present on the 2017 MRI. There is no nerve root compression. 3. At C5-C6 there is stable disc bulging and mild bilateral foraminal narrowing but no nerve root compression. 4. At C6-C7, there is stable disc protrusion and mild to moderate foraminal narrowing and mild spinal stenosis. There is no nerve root compression. 5. There is a normal enhancement pattern and there are no acute findings.Marland Kitchen   MRI thoracic 10/10/2017: IMPRESSION: This MRI of the thoracic spine without contrast shows the following: 1. Anterior to central focus within the spinal cord adjacent to T8 and central focus within the spinal cord adjacent to T11. These are nonspecific but consistent with transverse myelitis or demyelination.. 2. 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 Posterior decompression from L3-S1. 2. 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 This is unchanged when compared to the previous MRI. 3. At L4-L5, there is left greater than right facet hypertrophy causing moderate left foraminal narrowing. There is no definite nerve root compression though there is some encroachment upon the left L4 nerve root. This appears stable. 4. At L5-S1, there is severe right greater than left facet hypertrophy and a right synovial cyst causing severe  right lateral recess stenosis. This could lead to right S1 nerve root compression. Changes appear stable compared to the 08/31/2016 MRI. 5. There is  mild epidural fibrosis to the right at L4-L5 and L5-S1.   MRI Brain 11/10/15 IMPRESSION: This MRI of the brain with and without contrast shows the following: 1. Scattered T2/FLAIR hyperintense foci in the hemispheres and right cerebellum in a pattern and configuration consistent with chronic demyelinating plaque associated with multiple sclerosis. None of the foci appears to be acute. However, when compared to the MRI dated 11/07/2013, one left frontal subcortical focus has occurred during the interim. 2, There are no acute findings.  MRi Cervical 11/10/15 IMPRESSION: This MRI of the cervical spine with and without contrast shows the following: 1. There are three T2 hyperintense foci within the spinal cord as detailed above consistent with chronic demyelinating plaque associated with multiple sclerosis. None of the foci enhances after contrast administration. 2. Mild degenerative changes at C5-C6 and C6-C7 that did not lead to any nerve root impingement. 3. There is a normal enhancement pattern.  2017   (pre-surgical) 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 2. Posterior element fusion anomaly/spina bifida at L3 and below. 3. Advanced facet disease in the lower lumbar spine. 4. 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     REVIEW OF SYSTEMS: Out of a complete 14 system review of symptoms, the patient complains only of the following symptoms, fatigue, depression, chronic back pain, intermittent insomnia, and all other reviewed systems are negative.   ALLERGIES: No Known Allergies   HOME MEDICATIONS: Outpatient Medications Prior to Visit  Medication Sig Dispense Refill  . buPROPion (WELLBUTRIN XL) 300 MG 24 hr tablet Take 1 tablet (300 mg total) by mouth daily. PLEASE CALL OFFICE AT  [phone] TO SCHEDULE FOLLOW UP FOR FUTURE REFILLS 30 tablet 0  . cholecalciferol (VITAMIN D) 1000 UNITS tablet Take 50,000 Units by mouth daily. Take 1 capsules daily    . Fingolimod HCl (GILENYA) 0.5 MG CAPS Take 1 capsule (0.5 mg total) by mouth daily. 90 capsule 3  . ibuprofen (ADVIL,MOTRIN) 800 MG tablet Take 1 tablet (800 mg total) by mouth every 8 (eight) hours as needed for mild pain. 30 tablet 0  . loratadine (CLARITIN) 10 MG tablet Take 10 mg by mouth daily.    . phentermine 37.5 MG capsule Take 1 capsule (37.5 mg total) by mouth every morning. 90 capsule 1  . PROAIR HFA 108 (90 Base) MCG/ACT inhaler Inhale 2 puffs into the lungs every 6 (six) hours as needed for wheezing or shortness of breath. 6.7 g 1  . zolpidem (AMBIEN) 5 MG tablet Take 1 tablet (5 mg total) by mouth at bedtime as needed for sleep. 30 tablet 5  . solifenacin (VESICARE) 10 MG tablet Take 1 tablet by mouth daily 30 tablet 11   No facility-administered medications prior to visit.     PAST MEDICAL HISTORY: Past Medical History:  Diagnosis Date  . Headache   . Multiple sclerosis (HCC)   . Pseudotumor cerebri   . Spina bifida (HCC)      PAST SURGICAL HISTORY: Past Surgical History:  Procedure Laterality Date  . BACK SURGERY  2017     FAMILY HISTORY: Family History  Problem Relation Age of Onset  . Hypertension Mother   .  Thyroid disease Mother   . Hypertension Father   . Thyroid disease Sister   . Cancer Maternal Grandmother        brain  . Heart disease Maternal Grandfather   . Multiple sclerosis Neg Hx      SOCIAL HISTORY: Social History   Socioeconomic History  . Marital status: Single    Spouse name: Not on file  . Number of children: Not on file  . Years of education: Not on file  . Highest education level: Not on file  Occupational History  . Not on file  Tobacco Use  . Smoking status: Never Smoker  . Smokeless tobacco: Never Used  Vaping Use  . Vaping Use: Never used    Substance and Sexual Activity  . Alcohol use: No    Alcohol/week: 0.0 standard drinks  . Drug use: No  . Sexual activity: Yes    Birth control/protection: None  Other Topics Concern  . Not on file  Social History Narrative  . Not on file   Social Determinants of Health   Financial Resource Strain:   . Difficulty of Paying Living Expenses: Not on file  Food Insecurity:   . Worried About Programme researcher, broadcasting/film/video in the Last Year: Not on file  . Ran Out of Food in the Last Year: Not on file  Transportation Needs:   . Lack of Transportation (Medical): Not on file  . Lack of Transportation (Non-Medical): Not on file  Physical Activity:   . Days of Exercise per Week: Not on file  . Minutes of Exercise per Session: Not on file  Stress:   . Feeling of Stress : Not on file  Social Connections:   . Frequency of Communication with Friends and Family: Not on file  . Frequency of Social Gatherings with Friends and Family: Not on file  . Attends Religious Services: Not on file  . Active Member of Clubs or Organizations: Not on file  . Attends Banker Meetings: Not on file  . Marital Status: Not on file  Intimate Partner Violence:   . Fear of Current or Ex-Partner: Not on file  . Emotionally Abused: Not on file  . Physically Abused: Not on file  . Sexually Abused: Not on file      PHYSICAL EXAM  Vitals:   04/03/20 0824  BP: 130/86  Pulse: 95  Weight: 187 lb 9.6 oz (85.1 kg)  Height:  (1.6 m)   Body mass index is 33.23 kg/m.   Generalized: Well developed, in no acute distress   Neurological examination  Mentation: Alert oriented to time, place, history taking. Follows all commands speech and language fluent Cranial nerve II-XII: Pupils were equal round reactive to light. Extraocular movements were full, visual field were full on confrontational test. Facial sensation and strength were normal. Uvula tongue midline. Head turning and shoulder shrug  were normal  and symmetric. Motor: The motor testing reveals 5 over 5 strength of all 4 extremities. Good symmetric motor tone is noted throughout.  Sensory: Sensory testing is intact to soft touch on all 4 extremities. No evidence of extinction is noted.  Coordination: Cerebellar testing reveals good finger-nose-finger and heel-to-shin bilaterally.  Gait and station: Gait is normal.  Reflexes: Deep tendon reflexes are brisk but symmetric bilaterally.     DIAGNOSTIC DATA (LABS, IMAGING, TESTING) - I reviewed patient records, labs, notes, testing and imaging myself where available.  Lab Results  Component Value Date   WBC 5.4 07/19/2019  HGB 13.2 07/19/2019   HCT 40.2 07/19/2019   MCV 92 07/19/2019   PLT 304 07/19/2019      Component Value Date/Time   NA 142 07/19/2019 0923   K 4.2 07/19/2019 0923   CL 104 07/19/2019 0923   CO2 26 07/19/2019 0923   GLUCOSE 78 07/19/2019 0923   GLUCOSE 109 (H) 05/18/2016 0449   BUN 10 07/19/2019 0923   CREATININE 0.84 07/19/2019 0923   CALCIUM 8.9 07/19/2019 0923   PROT 6.0 07/19/2019 0923   ALBUMIN 3.9 07/19/2019 0923   AST 14 07/19/2019 0923   ALT 15 07/19/2019 0923   ALKPHOS 76 07/19/2019 0923   BILITOT 0.2 07/19/2019 0923   GFRNONAA 84 07/19/2019 0923   GFRAA 97 07/19/2019 0923   No results found for: CHOL, HDL, LDLCALC, LDLDIRECT, TRIG, CHOLHDL No results found for: UMPN3I No results found for: VITAMINB12 No results found for: TSH    ASSESSMENT AND PLAN  44 y.o. year old female  has a past medical history of Headache, Multiple sclerosis (HCC), Pseudotumor cerebri, and Spina bifida (HCC). here with   Relapsing remitting multiple sclerosis (HCC) - Plan: CBC with Differential/Platelets, Hepatic Function Panel, Vitamin D, 25-hydroxy  Other fatigue - Plan: Armodafinil 150 MG tablet  Increased frequency of urination - Plan: oxybutynin (DITROPAN-XL) 5 MG 24 hr tablet, mirabegron ER (MYRBETRIQ) 25 MG TB24 tablet  Chronic back pain, unspecified  back location, unspecified back pain laterality  Vitamin D deficiency - Plan: Vitamin D, 25-hydroxy   We will continue Gilenya as prescribed. I will update labs and MRI brain, cervical and thoracic spine. We will switch her back to oxybutynin and Mybrtriq as this seemed to work better than Vesicare alone. I will start armodafinil 150mg  for MS fatigue. May increase if needed. She will continue Wellbutrin daily and Ambien as needed. PDMP reviewed and shows appropriate refills. She was encouraged to stay active and well hydrated. She will return to see [place] in 6 months. She verbalizes understanding and agreement with this plan.    I spent 35 minutes of face-to-face and non-face-to-face time with patient.  This included previsit chart review, lab review, study review, order entry, electronic health record documentation, patient education.    Shawnie Dapper, MSN, FNP-C 04/03/2020, 8:59 AM  [facility] [address], [address] [place], [place] [address] [phone]

## 2020-04-02 NOTE — Patient Instructions (Addendum)
We will continue Gilenya as prescribed. I will update labs and MRI of brain, cervical and thoracic spine.   We will switch you back to oxybutynin and Myrbetriq. Stop Vesicare.   I will start armodafinil 150mg  daily for fatigue. Please make sure to stay well hydrated.   Continue Wellbutrin daily and Ambien as needed.   Follow up in 6 months    Armodafinil tablets What is this medicine? ARMODAFINIL (ar moe DAF i nil) is used to treat excessive sleepiness caused by certain sleep disorders. This includes narcolepsy, sleep apnea, and shift work sleep disorder. This medicine may be used for other purposes; ask your health care provider or pharmacist if you have questions. COMMON BRAND NAME(S): Nuvigil What should I tell my health care provider before I take this medicine? They need to know if you have any of these conditions:  bipolar disorder  depression  drug or alcohol abuse or addiction  heart disease  high blood pressure  kidney disease  liver disease  schizophrenia  suicidal thoughts, plans, or attempt; a previous suicide attempt by you or a family member  an unusual or allergic reaction to armodafinil, modafinil, medicines, foods, dyes, or preservatives  pregnant or trying to get pregnant  breast-feeding How should I use this medicine? Take this medicine by mouth with a glass of water. Follow the directions on the prescription label. Take your doses at regular intervals. Do not take your medicine more often than directed. Do not stop taking this medicine suddenly except upon the advice of your doctor. Stopping this medicine too quickly may cause serious side effects or your condition may worsen. A special MedGuide will be given to you by the pharmacist with each prescription and refill. Be sure to read this information carefully each time. Talk to your pediatrician regarding the use of this medicine in children. While this drug may be prescribed for children as young as  57 years of age for selected conditions, precautions do apply. Overdosage: If you think you have taken too much of this medicine contact a poison control center or emergency room at once. NOTE: This medicine is only for you. Do not share this medicine with others. What if I miss a dose? If you miss a dose, take it as soon as you can. If it is almost time for your next dose, take only that dose. Do not take double or extra doses. What may interact with this medicine? Do not take this medicine with any of the following medications:  amphetamine or dextroamphetamine  dexmethylphenidate or methylphenidate  MAOIs like Carbex, Eldepryl, Marplan, Nardil, and Parnate  pemoline  procarbazine This medicine may also interact with the following medications:  antifungal medicines like itraconazole or ketoconazole  barbiturates, like phenobarbital  birth control pills or other hormone-containing birth control devices or implants  carbamazepine  cyclosporine  diazepam  medicines for depression, anxiety, or psychotic disturbances  phenytoin  propranolol  triazolam  warfarin This list may not describe all possible interactions. Give your health care provider a list of all the medicines, herbs, non-prescription drugs, or dietary supplements you use. Also tell them if you smoke, drink alcohol, or use illegal drugs. Some items may interact with your medicine. What should I watch for while using this medicine? Visit your doctor or healthcare provider for regular checks on your progress. The full effect of this medicine may not be seen right away. This medicine may affect your concentration, function, or may hide signs that you are tired. You  may get dizzy. This medicine will not eliminate your abnormal tendency to fall asleep and is not a replacement for sleep. Do not change your previous behavior regarding potentially dangerous activities, such as driving, using machinery, or doing anything  that needs mental alertness until you know how this drug affects you. Alcohol can make you more dizzy and may interfere with your response to this medicine or your alertness. Avoid alcoholic drinks. This medicine may cause serious skin reactions. They can happen weeks to months after starting the medicine. Contact your healthcare provider right away if you notice fevers or flu-like symptoms with a rash. The rash may be red or purple and then turn into blisters or peeling of the skin. Or, you might notice a red rash with swelling of the face, lips, or lymph nodes in your neck or under your arms. Birth control pills may not work properly while you are taking this medicine. While using birth control pills, you will need an additional barrier method or an alternative non-hormonal method of birth control during treatment with armodafinil and for 1 month after stopping armodafinil. Talk to your doctor about which extra method of birth control is right for you. It is unknown if the effects of this medicine will be increased by the use of caffeine. Caffeine is found in many foods, beverages, and medications. Ask your doctor if you should limit or change your intake of caffeine-containing products while on this medicine. Do not stop previously prescribed treatments for your condition, such as a CPAP machine, except on the advise of your physician or healthcare provider. What side effects may I notice from receiving this medicine? Side effects that you should report to your doctor or health care professional as soon as possible:  allergic reactions like skin rash, itching or hives, swelling of the face, lips, or tongue  anxiety  breathing or swallowing problems  chest pain  depressed mood  elevated mood, decreased need for sleep, racing thoughts, impulsive behavior  fast, irregular heartbeat  hallucination, loss of contact with reality  increased blood pressure  mouth sores, blisters, or peeling  skin  rash, fever, and swollen lymph nodes  redness, blistering, peeling, or loosening of the skin, including inside the mouth  sore throat, fever, or chills  suicidal thoughts or other mood changes  tremors  vomiting Side effects that usually do not require medical attention (report to your doctor or health care professional if they continue or are bothersome):  headache  nausea, diarrhea, or stomach upset  nervousness  trouble sleeping This list may not describe all possible side effects. Call your doctor for medical advice about side effects. You may report side effects to FDA at 1-800-FDA-1088. Where should I keep my medicine? Keep out of the reach of children. Store at room temperature between 20 and 25 degrees C (68 and 77 degrees F). Throw away any unused medicine after the expiration date. NOTE: This sheet is a summary. It may not cover all possible information. If you have questions about this medicine, talk to your doctor, pharmacist, or health care provider.  2020 Elsevier/Gold Standard (2018-08-29 10:01:22)   Multiple Sclerosis Multiple sclerosis (MS) is a disease of the brain, spinal cord, and optic nerves (central nervous system). It causes the body's disease-fighting (immune) system to destroy the protective covering (myelin sheath) around nerves in the brain. When this happens, signals (nerve impulses) going to and from the brain and spinal cord do not get sent properly or may not get sent  at all. There are several types of MS:  Relapsing-remitting MS. This is the most common type. This causes sudden attacks of symptoms. After an attack, you may recover completely until the next attack, or some symptoms may remain permanently.  Secondary progressive MS. This usually develops after the onset of relapsing-remitting MS. Similar to relapsing-remitting MS, this type also causes sudden attacks of symptoms. Attacks may be less frequent, but symptoms slowly get worse  (progress) over time.  Primary progressive MS. This causes symptoms that steadily progress over time. This type of MS does not cause sudden attacks of symptoms. The age of onset of MS varies, but it often develops between 6-25 years of age. MS is a lifelong (chronic) condition. There is no cure, but treatment can help slow down the progression of the disease. What are the causes? The cause of this condition is not known. What increases the risk? You are more likely to develop this condition if:  You are a woman.  You have a relative with MS. However, the condition is not passed from parent to child (inherited).  You have a lack (deficiency) of vitamin D.  You smoke. MS is more common in the [place] than in the [place]. What are the signs or symptoms? Relapsing-remitting and secondary progressive MS cause symptoms to occur in episodes or attacks that may last weeks to months. There may be long periods between attacks in which there are almost no symptoms. Primary progressive MS causes symptoms to steadily progress after they develop. Symptoms of MS vary because of the many different ways it affects the central nervous system. The main symptoms include:  Vision problems and eye pain.  Numbness.  Weakness.  Inability to move your arms, hands, feet, or legs (paralysis).  Balance problems.  Shaking that you cannot control (tremors).  Muscle spasms.  Problems with thinking (cognitive changes). MS can also cause symptoms that are associated with the disease, but are not always the direct result of an MS attack. They may include:  Inability to control urination or bowel movements (incontinence).  Headaches.  Fatigue.  Inability to tolerate heat.  Emotional changes.  Depression.  Pain. How is this diagnosed? This condition is diagnosed based on:  Your symptoms.  A neurological exam. This involves checking central nervous system function,  such as nerve function, reflexes, and coordination.  MRIs of the brain and spinal cord.  Lab tests, including a lumbar puncture that tests the fluid that surrounds the brain and spinal cord (cerebrospinal fluid).  Tests to measure the electrical activity of the brain in response to stimulation (evoked potentials). How is this treated? There is no cure for MS, but medicines can help decrease the number and frequency of attacks and help relieve nuisance symptoms. Treatment options may include:  Medicines that reduce the frequency of attacks. These medicines may be given by injection, by mouth (orally), or through an IV.  Medicines that reduce inflammation (steroids). These may provide short-term relief of symptoms.  Medicines to help control pain, depression, fatigue, or incontinence.  Vitamin D, if you have a deficiency.  Using devices to help you move around (assistive devices), such as braces, a cane, or a walker.  Physical therapy to strengthen and stretch your muscles.  Occupational therapy to help you with everyday tasks.  Alternative or complementary treatments such as exercise, massage, or acupuncture. Follow these instructions at home:  Take over-the-counter and prescription medicines only as told by your health care provider.  Do not drive or use heavy machinery while taking prescription pain medicine.  Use assistive devices as recommended by your physical therapist or your health care provider.  Exercise as directed by your health care provider.  Return to your normal activities as told by your health care provider. Ask your health care provider what activities are safe for you.  Reach out for support. Share your feelings with friends, family, or a support group.  Keep all follow-up visits as told by your health care provider and therapists. This is important. Where to find more information  National Multiple Sclerosis Society:  https://www.nationalmssociety.org Contact a health care provider if:  You feel depressed.  You develop new pain or numbness.  You have tremors.  You have problems with sexual function. Get help right away if:  You develop paralysis.  You develop numbness.  You have problems with your bladder or bowel function.  You develop double vision.  You lose vision in one or both eyes.  You develop suicidal thoughts.  You develop severe confusion. If you ever feel like you may hurt yourself or others, or have thoughts about taking your own life, get help right away. You can go to your nearest emergency department or call:  Your local emergency services (911 in the [place]  A suicide crisis helpline, such as the National Suicide Prevention Lifeline at 440-865-0860. This is open 24 hours a day. Summary  Multiple sclerosis (MS) is a disease of the central nervous system that causes the body's immune system to destroy the protective covering (myelin sheath) around nerves in the brain.  There are 3 types of MS: relapsing-remitting, secondary progressive, and primary progressive. Relapsing-remitting and secondary progressive MS cause symptoms to occur in episodes or attacks that may last weeks to months. Primary progressive MS causes symptoms to steadily progress after they develop.  There is no cure for MS, but medicines can help decrease the number and frequency of attacks and help relieve nuisance symptoms. Treatment may also include physical or occupational therapy.  If you develop numbness, paralysis, vision problems, or other neurological symptoms, get help right away. This information is not intended to replace advice given to you by your health care provider. Make sure you discuss any questions you have with your health care provider. Document Revised: 05/20/2017 Document Reviewed: 08/16/2016 Elsevier Patient Education  2020 [employer].

## 2020-04-03 ENCOUNTER — Encounter: Payer: Self-pay | Admitting: Family Medicine

## 2020-04-03 ENCOUNTER — Other Ambulatory Visit: Payer: Self-pay

## 2020-04-03 ENCOUNTER — Ambulatory Visit (INDEPENDENT_AMBULATORY_CARE_PROVIDER_SITE_OTHER): Payer: 59 | Admitting: Family Medicine

## 2020-04-03 VITALS — BP 130/86 | HR 95 | Ht 63.0 in | Wt 187.6 lb

## 2020-04-03 DIAGNOSIS — R5383 Other fatigue: Secondary | ICD-10-CM

## 2020-04-03 DIAGNOSIS — G35 Multiple sclerosis: Secondary | ICD-10-CM | POA: Diagnosis not present

## 2020-04-03 DIAGNOSIS — R350 Frequency of micturition: Secondary | ICD-10-CM | POA: Diagnosis not present

## 2020-04-03 DIAGNOSIS — M549 Dorsalgia, unspecified: Secondary | ICD-10-CM

## 2020-04-03 DIAGNOSIS — E559 Vitamin D deficiency, unspecified: Secondary | ICD-10-CM

## 2020-04-03 DIAGNOSIS — G8929 Other chronic pain: Secondary | ICD-10-CM

## 2020-04-03 MED ORDER — ARMODAFINIL 150 MG PO TABS: 150.0000 mg | ORAL_TABLET | Freq: Every day | ORAL | 1 refills | Status: DC

## 2020-04-03 MED ORDER — MIRABEGRON ER 25 MG PO TB24: 25.0000 mg | ORAL_TABLET | Freq: Every day | ORAL | 3 refills | Status: DC

## 2020-04-03 MED ORDER — OXYBUTYNIN CHLORIDE ER 5 MG PO TB24: 10.0000 mg | ORAL_TABLET | Freq: Every day | ORAL | 3 refills | Status: DC

## 2020-04-03 NOTE — Telephone Encounter (Signed)
This was refilled on 03/27/2020 by our office. Pt needs to schedule a follow up appointment.

## 2020-04-03 NOTE — Progress Notes (Signed)
I have read the note, and I agree with the clinical assessment and plan.  Euphemia Lingerfelt A. Shashana Fullington, MD, PhD, FAAN Certified in Neurology, Clinical Neurophysiology, Sleep Medicine, Pain Medicine and Neuroimaging  [facility] [address], [address] [place], [place] [address] [phone]  

## 2020-04-04 LAB — HEPATIC FUNCTION PANEL
ALT: 7 IU/L (ref 0–32)
AST: 10 IU/L (ref 0–40)
Albumin: 3.9 g/dL (ref 3.8–4.8)
Alkaline Phosphatase: 62 IU/L (ref 44–121)
Bilirubin Total: 0.3 mg/dL (ref 0.0–1.2)
Bilirubin, Direct: 0.12 mg/dL (ref 0.00–0.40)
Total Protein: 5.8 g/dL — ABNORMAL LOW (ref 6.0–8.5)

## 2020-04-04 LAB — VITAMIN D 25 HYDROXY (VIT D DEFICIENCY, FRACTURES): Vit D, 25-Hydroxy: 49.9 ng/mL (ref 30.0–100.0)

## 2020-04-04 LAB — CBC WITH DIFFERENTIAL/PLATELET
Basophils Absolute: 0 10*3/uL (ref 0.0–0.2)
Basos: 0 %
EOS (ABSOLUTE): 0 10*3/uL (ref 0.0–0.4)
Eos: 1 %
Hematocrit: 36.9 % (ref 34.0–46.6)
Hemoglobin: 12.5 g/dL (ref 11.1–15.9)
Immature Grans (Abs): 0 10*3/uL (ref 0.0–0.1)
Immature Granulocytes: 0 %
Lymphocytes Absolute: 0.2 10*3/uL — ABNORMAL LOW (ref 0.7–3.1)
Lymphs: 5 %
MCH: 31.3 pg (ref 26.6–33.0)
MCHC: 33.9 g/dL (ref 31.5–35.7)
MCV: 92 fL (ref 79–97)
Monocytes Absolute: 0.6 10*3/uL (ref 0.1–0.9)
Monocytes: 15 %
Neutrophils Absolute: 3.2 10*3/uL (ref 1.4–7.0)
Neutrophils: 79 %
Platelets: 297 10*3/uL (ref 150–450)
RBC: 4 x10E6/uL (ref 3.77–5.28)
RDW: 13.6 % (ref 11.7–15.4)
WBC: 4 10*3/uL (ref 3.4–10.8)

## 2020-04-07 ENCOUNTER — Telehealth: Payer: Self-pay | Admitting: Family Medicine

## 2020-04-07 NOTE — Telephone Encounter (Signed)
[facility] auth: [id number], [phone] & [phone] (exp. 04/07/20 to 07/06/20) order sent to [facility] . They will reach out to the patient to schedule.

## 2020-04-07 NOTE — Telephone Encounter (Signed)
[facility] pending  °

## 2020-04-14 ENCOUNTER — Telehealth: Payer: Self-pay | Admitting: *Deleted

## 2020-04-14 NOTE — Telephone Encounter (Addendum)
Armodafinil PA, key: [id number], G32, R53.83. This request is  being processed.  To check for an update later, open this request again from your dashboard. If you have any questions, please contact [facility] at [phone].

## 2020-04-14 NOTE — Telephone Encounter (Signed)
I relayed to her via VM and also Designer, industrial/product.

## 2020-04-14 NOTE — Telephone Encounter (Signed)
-----   Message from Shawnie Dapper, NP sent at 04/14/2020  7:23 AM EDT ----- Please let her know that her labs look good. Her lymphocyte count is 0.2. 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 I will continue to monitor it closely.

## 2020-04-16 MED ORDER — MODAFINIL 100 MG PO TABS: 100.0000 mg | ORAL_TABLET | Freq: Every day | ORAL | 1 refills | Status: DC

## 2020-04-16 NOTE — Addendum Note (Signed)
Addended by: Shawnie Dapper L on: 04/16/2020 04:57 PM   Modules accepted: Orders

## 2020-04-16 NOTE — Telephone Encounter (Signed)
Called patient, LVM advising her that her insurance denied armodafinil. NP has sent in Rx for modafinil, which will require PA. Advised I was told her insurance will approve it. Left # for questions.

## 2020-04-16 NOTE — Telephone Encounter (Addendum)
Per CMM, armodafinil denied, PA Case: [id number]. No reason given. Called [facility] line, spoke with Caryn Bee to check reason for denial. He stated it is: Diagnosis not considered a medically accepted indication. Alternatives are dexmethyfenadate, methyfenadate, and modafinil which requires PA but is approved for her indication.  Will inform NP.

## 2020-04-17 ENCOUNTER — Telehealth: Payer: Self-pay | Admitting: *Deleted

## 2020-04-17 ENCOUNTER — Encounter: Payer: Self-pay | Admitting: *Deleted

## 2020-04-17 NOTE — Telephone Encounter (Signed)
Modafinil P,  key: [id number], G35. Tried /failed adderall- ineffective. [facility] has received your information, and the request will be reviewed. You may close this dialog, return to your dashboard, and perform other tasks.  You will receive an electronic determination in CoverMyMeds. You can see the latest determination by locating this request on your dashboard or by reopening this request. You will also receive a faxed copy of the determination. If you have any questions please contact [facility] at [phone].

## 2020-04-17 NOTE — Telephone Encounter (Signed)
Modafinil approved, PA Case: [id number],  Coverage Starts on: 04/17/2020 12:00:00 AM, Coverage Ends on: 04/17/2021 12:00:00 AM. Sent my chart to advise patient.

## 2020-04-19 ENCOUNTER — Ambulatory Visit: Payer: Medicaid Other | Attending: Internal Medicine

## 2020-04-19 DIAGNOSIS — Z23 Encounter for immunization: Secondary | ICD-10-CM

## 2020-04-19 NOTE — Progress Notes (Signed)
   Covid-19 Vaccination Clinic  Name:  SEERAT PEADEN    MRN: [id number] DOB: 1975/10/22  04/19/2020  Ms. Giller was observed post Covid-19 immunization for 15 minutes without incident. She was provided with Vaccine Information Sheet and instruction to access the V-Safe system.   Ms. Heal was instructed to call 911 with any severe reactions post vaccine: Marland Kitchen Difficulty breathing  . Swelling of face and throat  . A fast heartbeat  . A bad rash all over body  . Dizziness and weakness

## 2020-04-26 ENCOUNTER — Other Ambulatory Visit: Payer: Self-pay | Admitting: Neurology

## 2020-05-20 ENCOUNTER — Telehealth: Payer: Self-pay | Admitting: *Deleted

## 2020-05-20 NOTE — Telephone Encounter (Signed)
Faxed in complete/signed prescriber application to [employer] at [phone]. Received fax confirmation.

## 2020-05-20 NOTE — Telephone Encounter (Signed)
Called and spoke with patient. Advised it is time to re-enroll in the [employer] patient assistance program for her Gilenya. Current enrollment expires on 06/20/20. Advised we will send in our portion, she needs to complete her portion. He has not received application in the mail. She will come this week to pick up her application to fill out and send in. I placed up front for pick up.  FYI -she can also go to [email or web address] to download an editable version or call them at [phone].   For reference: they can fax completed form to [phone] or Mail it to them at: [employer] [address] [address], [place], [place] [address]   The fax we received states re-enrollment begins early January and may take 6-8 weeks to complete. If pt requires refills of Gilenya during re-enrollment period, they can call [phone].

## 2020-05-26 ENCOUNTER — Other Ambulatory Visit: Payer: Self-pay | Admitting: *Deleted

## 2020-05-26 MED ORDER — GILENYA 0.5 MG PO CAPS
1.0000 | ORAL_CAPSULE | Freq: Every day | ORAL | 3 refills | Status: DC
Start: 2020-05-26 — End: 2021-04-21

## 2020-05-31 ENCOUNTER — Other Ambulatory Visit: Payer: Self-pay | Admitting: Neurology

## 2020-06-05 ENCOUNTER — Other Ambulatory Visit: Payer: Self-pay | Admitting: Neurology

## 2020-06-11 ENCOUNTER — Ambulatory Visit
Admission: RE | Admit: 2020-06-11 | Discharge: 2020-06-11 | Disposition: A | Discharge status: Home / Self Care | Payer: 59 | Source: Ambulatory Visit | Attending: Family Medicine | Admitting: Family Medicine

## 2020-06-11 DIAGNOSIS — G35 Multiple sclerosis: Secondary | ICD-10-CM

## 2020-06-11 MED ORDER — GADOBENATE DIMEGLUMINE 529 MG/ML IV SOLN
15.0000 mL | Freq: Once | INTRAVENOUS | Status: AC | PRN
  Administered 2020-06-11: 15 mL via INTRAVENOUS

## 2020-06-16 ENCOUNTER — Other Ambulatory Visit: Payer: Self-pay

## 2020-06-16 ENCOUNTER — Ambulatory Visit
Admission: RE | Admit: 2020-06-16 | Discharge: 2020-06-16 | Disposition: A | Discharge status: Home / Self Care | Payer: 59 | Source: Ambulatory Visit | Attending: Family Medicine | Admitting: Family Medicine

## 2020-06-16 DIAGNOSIS — G35 Multiple sclerosis: Secondary | ICD-10-CM

## 2020-06-16 MED ORDER — GADOBENATE DIMEGLUMINE 529 MG/ML IV SOLN
18.0000 mL | Freq: Once | INTRAVENOUS | Status: AC | PRN
  Administered 2020-06-16: 17:00:00 18 mL via INTRAVENOUS

## 2020-06-18 ENCOUNTER — Telehealth: Payer: Self-pay | Admitting: *Deleted

## 2020-06-18 NOTE — Telephone Encounter (Signed)
Spoke with patient and advised that her MR Brain and cervical spine look stable. No new lesions were noted. 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 We will continue to monitor and consider referral to spinal specialist if it worsens. She had no questions,  verbalized understanding, appreciation.

## 2020-06-23 ENCOUNTER — Telehealth: Payer: Self-pay | Admitting: *Deleted

## 2020-06-23 NOTE — Telephone Encounter (Signed)
-----   Message from Shawnie Dapper, NP sent at 06/23/2020  9:50 AM EST ----- Please let her know that the thoracic MR looks failry stable. No new MS lesions. 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 I will send results to Dr Epimenio Foot for review as well. I do not anticipate any changes in treatment plan at this time but have her monitor symptoms and let us know if she experiences any change in weakness,/numbness.

## 2020-06-23 NOTE — Telephone Encounter (Signed)
I called pt and relayed that the MRI thoracic study results per Al/NP stable, no new MS lesions.  Syrinx remains does snot appear to have changed much at all.  Pt was not aware of this.  I relayed no change from last 09-2017.  She verbalized understanding.  She may ask about this when in for next appointment.

## 2020-06-29 ENCOUNTER — Other Ambulatory Visit: Payer: Self-pay | Admitting: Neurology

## 2020-07-08 ENCOUNTER — Other Ambulatory Visit: Payer: Self-pay | Admitting: Neurology

## 2020-07-08 MED ORDER — SOLIFENACIN SUCCINATE 10 MG PO TABS: ORAL_TABLET | ORAL | 4 refills | Status: DC

## 2020-07-23 NOTE — Telephone Encounter (Signed)
Received patient portion of application. Faxed provider/pt portion back to [employer] at [phone]. Received fax confirmation.

## 2020-09-02 ENCOUNTER — Telehealth: Payer: Self-pay | Admitting: Family Medicine

## 2020-09-02 NOTE — Telephone Encounter (Signed)
I attempted PA for Gilenya via CMM.  The patient was unable to be found.  I attempted the PA on [employer].  The payor was not responding.  I called [facility].  I spoke with the pharmacy helpdesk.  They started a PA but will have to fax [place] the questionnaire within 24 to 72 hours.  The case ID is [id number].

## 2020-09-02 NOTE — Telephone Encounter (Signed)
Coranda from [employer] called & stated [facility] needs a PA for Gilenya.  Her best contact: [phone]  Fax # for [facility]: [phone]

## 2020-09-17 NOTE — Telephone Encounter (Signed)
I faxed back further information to [facility] on 09/15/20 for PA. Received fax confirmation.  I called today to check on status of PA. Spoke with Irving Burton. She states they received fax on 09/15/20 but answers had not been inputed for some reason, PA pending still. She took verbal answers and submitted. Can take up to 24-48 hr to review. I asked it be marked urgent and reviewed asap. She placed note on PA but states there are no promises it will be reviewed urgently.

## 2020-09-22 ENCOUNTER — Telehealth: Payer: Self-pay | Admitting: *Deleted

## 2020-09-22 NOTE — Telephone Encounter (Signed)
Received fax from [employer] that patient approved for patient assistance for Gilenya until 06/20/21. Patient ID [id number].

## 2020-09-23 NOTE — Telephone Encounter (Addendum)
Called [facility] at [phone] to check on status of PA. Spoke w/ Dawn. PA approved 09/17/20-09/16/21. PA#: [id number].  She will fax copy of approval to [place] at [phone].

## 2020-10-08 ENCOUNTER — Ambulatory Visit: Payer: 59 | Admitting: Neurology

## 2020-10-23 ENCOUNTER — Encounter: Payer: Self-pay | Admitting: Neurology

## 2020-10-23 ENCOUNTER — Ambulatory Visit (INDEPENDENT_AMBULATORY_CARE_PROVIDER_SITE_OTHER): Payer: 59 | Admitting: Neurology

## 2020-10-23 VITALS — BP 140/87 | HR 87 | Ht 63.0 in | Wt 201.5 lb

## 2020-10-23 DIAGNOSIS — Z79899 Other long term (current) drug therapy: Secondary | ICD-10-CM | POA: Diagnosis not present

## 2020-10-23 DIAGNOSIS — G35 Multiple sclerosis: Secondary | ICD-10-CM | POA: Diagnosis not present

## 2020-10-23 DIAGNOSIS — R350 Frequency of micturition: Secondary | ICD-10-CM

## 2020-10-23 DIAGNOSIS — E559 Vitamin D deficiency, unspecified: Secondary | ICD-10-CM

## 2020-10-23 DIAGNOSIS — F09 Unspecified mental disorder due to known physiological condition: Secondary | ICD-10-CM

## 2020-10-23 DIAGNOSIS — R5383 Other fatigue: Secondary | ICD-10-CM

## 2020-10-23 DIAGNOSIS — Z9889 Other specified postprocedural states: Secondary | ICD-10-CM

## 2020-10-23 MED ORDER — MIRABEGRON ER 50 MG PO TB24: 50.0000 mg | ORAL_TABLET | Freq: Every day | ORAL | 11 refills | Status: DC

## 2020-10-23 MED ORDER — AMPHETAMINE-DEXTROAMPHETAMINE 20 MG PO TABS: 20.0000 mg | ORAL_TABLET | Freq: Two times a day (BID) | ORAL | 0 refills | Status: DC

## 2020-10-23 NOTE — Progress Notes (Signed)
[facility]  PATIENT: Mary Beck DOB: [birth year]   _________________________________   HISTORICAL  CHIEF COMPLAINT:  Chief Complaint  Patient presents with  . Follow-up    RM 11. Last seen 04/03/2020 by AL,NP. On Gilenya for MS. No new sx. Was taking Myrbetriq but stopped d/t insurance not covering. Insurance will now cover, wants to try this again.    HISTORY OF PRESENT ILLNESS:  Mary Beck is a 45 y.o. woman with relapsing remitting MS diagnosed in 2010.    Update 10/23/2020: She is tolerating Gilenya well for her RRMS.   She has no exacerbations but feels slightly weaker going upstairs..    Gait is fine but she prefers to use the bannister on stairs.   Strength is fine.    She denies numbness/dysesthesias in legs.   Bladder function is poor   On Myrbetriq she does not have incontinence.  She is better on Myrbetriq 50 mg than solifenacin (vesicare) and better than oxybutynin.   Vision is fine.  She has fatigue daily which is helped by phentermine some but Adderall was better for her..  . Mood is ok with Wellbutrin but she still has mild depression.   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   She has difficulty with processing and word finding.  Adderall had helped the most in the past.  She has had trouble with executive function tasks.   Sleep is usually ok.   She takes zolpidem rarely fpr insomnia.      She take Vit D supplement daily.  Vit D was 04/03/2020 was 49.9  Lower back pain:   She had surgery 05/2016 in [facility] for the tethered cord and synovial cyst.   She feels left leg weakness and pain is better.      MS HIstory:   She was diagnosed in 2010 after presenting with numbness in the feet up to her knees. She had an MRI of the brain that was abnormal. This was followed by a lumbar puncture in the CSF was also abnormal. She was diagnosed with MS and was placed on Copaxone. She had an  exacerbation and was switched to Betaseron. Due to needle fatigue she was switched to Gilenya about 2 years ago. Since being on Gilenya her MS has been stable. She denies any exacerbations. She tolerates Gilenya well. She had an MRI of the brain early 2015 that was unchanged compared to her previous MRI.   MRI May 2017 shows a new large chronic focus in the left frontal lobe not present in 2015. MRI of the cervical spine shows 3 T2 hyperintense foci  Imaging: MRI of the brain, cervical spine thoracic spine 06/16/2020 and 06/11/2020 was unchanged compared to the previous studies from 2019.  MRI brain 10/10/2017: 1.   Multiple T2/FLAIR hyperintense foci in the periventricular, juxtacortical and deep white matter of both hemispheres and in the right cerebellar hemisphere in a pattern and configuration consistent with chronic demyelinating plaque.  None of the foci appears to be acute.  When compared to the MRI dated  11/10/2015, there are no new lesions and 1 focus in the left frontal lobe appears much smaller on the current study.   2.    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 3.   There are no acute findings  MRI cervical 10/10/2017: IMPRESSION: This MRI of the  cervical spine with and without contrast shows the following: 1.    There are several foci within the spinal cord adjacent to C2, C4-C5, C5 and C7 consistent with chronic demyelinating plaque associated with multiple sclerosis.  All these foci were noted on the 11/10/2015 MRI and they do not enhance. 2.    At C3-C4 there is a small left paramedian disc bulge not present on the 2017 MRI.  There is no nerve root compression. 3.    At C5-C6 there is stable disc bulging and mild bilateral foraminal narrowing but no nerve root compression. 4.    At C6-C7, there is stable disc protrusion and mild to moderate foraminal narrowing and mild spinal  stenosis.  There is no nerve root compression. 5.    There is a normal enhancement pattern and there are no acute findings.Marland Kitchen      MRI thoracic 10/10/2017: IMPRESSION: This MRI of the thoracic spine without contrast shows the following: 1.    Anterior to central focus within the spinal cord adjacent to T8 and central focus within the spinal cord adjacent to T11.  These are nonspecific but consistent with transverse myelitis or demyelination.. 2.    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    Posterior decompression from L3-S1. 2.    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  This is unchanged when compared to the previous MRI. 3.    At L4-L5, there is left greater than right facet hypertrophy causing moderate left foraminal narrowing.  There is no definite nerve root compression though there is some encroachment upon the left L4 nerve root.  This appears stable. 4.    At L5-S1, there is severe right greater than left facet hypertrophy and a right synovial cyst causing severe right lateral recess stenosis.  This could lead to right S1 nerve root compression.  Changes appear stable compared to the 08/31/2016 MRI. 5.    There is mild epidural fibrosis to the right at L4-L5 and L5-S1.   MRI Brain 11/10/15 IMPRESSION: This MRI of the brain with and without contrast shows the following: 1. Scattered T2/FLAIR hyperintense foci in the hemispheres and right cerebellum in a pattern and configuration consistent with chronic demyelinating plaque associated with multiple sclerosis. None of the foci appears to be acute. However, when compared to the MRI dated 11/07/2013, one left frontal subcortical focus has occurred during the interim. 2, There are no acute findings.  MRi Cervical 11/10/15 IMPRESSION: This MRI of the cervical spine  with and without contrast shows the following: 1. There are three T2 hyperintense foci within the spinal cord as detailed above consistent with chronic demyelinating plaque associated with multiple sclerosis. None of the foci enhances after contrast administration. 2. Mild degenerative changes at C5-C6 and C6-C7 that did not lead to any nerve root impingement. 3. There is a normal enhancement pattern.  2017   (pre-surgical) 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 2. Posterior element fusion anomaly/spina bifida at L3 and below. 3. Advanced facet disease in the lower lumbar spine. 4. 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  Other data: She saw Orie Fisherman, Psychologist for  neurocognitive evaluation in 12/18/2015: Overall, Ms. Lusty's neurocognitive profile is consistent with a mild neurocognitive disorder (i.e., MCI), predominantly dysexecutive, with the combination of her multiple sclerosis and mood symptomology as likely etiologies.      REVIEW OF SYSTEMS: Constitutional: No fevers, chills, sweats, or change in appetite.   She reports fatigue Eyes: No visual changes, double vision, .  However, mild eye pain Ear, nose and throat: No hearing loss, ear pain, nasal congestion, sore throat Cardiovascular: No chest pain, palpitations Respiratory: No shortness of breath at rest or with exertion.   No wheezes GastrointestinaI: No nausea, vomiting, diarrhea, abdominal pain, fecal incontinence Genitourinary: No dysuria or urinary retention.  She notes frequency and nocturia. Musculoskeletal: No neck pain.  Notes back pain Integumentary: No rash, pruritus, skin lesions Neurological: as above.  Also, occasional Headache Psychiatric: Reports mild depression but little anxiety Endocrine: No palpitations,  diaphoresis, change in appetite, change in weigh or increased thirst Hematologic/Lymphatic: No anemia, purpura, petechiae. Allergic/Immunologic: No itchy/runny eyes, nasal congestion, recent allergic reactions, rashes  ALLERGIES: No Known Allergies  HOME MEDICATIONS:  Current Outpatient Medications:  .  buPROPion (WELLBUTRIN XL) 300 MG 24 hr tablet, TAKE ONE TABLET BY MOUTH DAILY - NEED APPOINTMENT FOR ADDITIONAL REFILLS, Disp: 30 tablet, Rfl: 5 .  cholecalciferol (VITAMIN D) 1000 UNITS tablet, Take 50,000 Units by mouth daily. Take 1 capsules daily, Disp: , Rfl:  .  Fingolimod HCl (GILENYA) 0.5 MG CAPS, Take 1 capsule (0.5 mg total) by mouth daily., Disp: 90 capsule, Rfl: 3 .  ibuprofen (ADVIL,MOTRIN) 800 MG tablet, Take 1 tablet (800 mg total) by mouth every 8 (eight) hours as needed for mild pain., Disp: 30 tablet, Rfl: 0 .  loratadine (CLARITIN) 10 MG tablet, Take 10 mg by mouth daily., Disp: , Rfl:  .  mirabegron ER (MYRBETRIQ) 50 MG TB24 tablet, Take 1 tablet (50 mg total) by mouth daily., Disp: 30 tablet, Rfl: 11 .  PROAIR HFA 108 (90 Base) MCG/ACT inhaler, Inhale 2 puffs into the lungs every 6 (six) hours as needed for wheezing or shortness of breath., Disp: 6.7 g, Rfl: 1 .  zolpidem (AMBIEN) 5 MG tablet, Take 1 tablet (5 mg total) by mouth at bedtime as needed for sleep., Disp: 30 tablet, Rfl: 5 .  amphetamine-dextroamphetamine (ADDERALL) 20 MG tablet, Take 1 tablet (20 mg total) by mouth 2 (two) times daily., Disp: 60 tablet, Rfl: 0  PAST MEDICAL HISTORY: Past Medical History:  Diagnosis Date  . Headache   . Multiple sclerosis (HCC)   . Pseudotumor cerebri   . Spina bifida (HCC)     PAST SURGICAL HISTORY: Past Surgical History:  Procedure Laterality Date  . BACK SURGERY  2017    FAMILY HISTORY: Family History  Problem Relation Age of Onset  . Hypertension Mother   . Thyroid disease Mother   . Hypertension Father   . Thyroid disease Sister   . Cancer Maternal  Grandmother        brain  . Heart disease Maternal Grandfather   . Multiple sclerosis Neg Hx     SOCIAL HISTORY:  Social History   Socioeconomic History  . Marital status: Single    Spouse name: Not on file  . Number of children: Not on file  . Years of education: Not on file  . Highest education level: Not on file  Occupational History  . Not on file  Tobacco Use  . Smoking status: Never Smoker  . Smokeless tobacco: Never Used  Vaping Use  .  Vaping Use: Never used  Substance and Sexual Activity  . Alcohol use: No    Alcohol/week: 0.0 standard drinks  . Drug use: No  . Sexual activity: Yes    Birth control/protection: None  Other Topics Concern  . Not on file  Social History Narrative  . Not on file   Social Determinants of Health   Financial Resource Strain: Not on file  Food Insecurity: Not on file  Transportation Needs: Not on file  Physical Activity: Not on file  Stress: Not on file  Social Connections: Not on file  Intimate Partner Violence: Not on file     PHYSICAL EXAM  Vitals:   10/23/20 0907  BP: 140/87  Pulse: 87  Weight: 201 lb 8 oz (91.4 kg)  Height: 5\' 3"  (1.6 m)    Body mass index is 35.69 kg/m.   General: The patient is well-developed and well-nourished and in no acute distress    Neurologic Exam  Mental status: The patient is alert and oriented x 3 at the time of the examination. The patient has apparent normal recent and remote memory, with an apparently normal attention span and concentration ability.   Speech is normal.  Cranial nerves: Extraocular movements are full.     Facial symmetry is present. There is good facial sensation to soft touch bilaterally.Facial strength is normal.  Trapezius and sternocleidomastoid strength is normal. No dysarthria is noted.    No obvious hearing deficits are noted.  Motor:  Muscle bulk is normal.   Tone is normal. Strength is  5 / 5 in all 4 extremities .   Sensory: She has mildly reduced  touch and vibration sensation in the right hand compared to the left hand. Leg sensation was normal.   Coordination: Cerebellar testing reveals good finger-nose-finger and heel-to-shin bilaterally.  Gait and station: Station is normal.   Her gait is normal.  The tandem gait is mildly wide.  Romberg is negative.   Reflexes: Deep tendon reflexes are normal and symmetric in the arms.  Deep tendon reflexes were increased at the knees.  There is a crossed adductor response on the left.  Asymmetry at the ankles, larger reflex on the left.    ASSESSMENT AND PLAN  1. MS (multiple sclerosis) (HCC)   2. High risk medication use   3. Other fatigue   4. Increased frequency of urination   5. Cognitive deficit secondary to multiple sclerosis (HCC)   6. Vitamin D deficiency   7. History of lumbar surgery       1.   Continue Gilenya.  We will check a CBC and CMP with differential today.   2.   Myrbetriq for bladder dysfunction 3.   Stay active and exercise  4.    Changed to Adderall from phentermine 5.    Due to her cognitive and physical issues and fatigue related to MS, she is disabled.. 6.    RTC  6 months or sooner if new or worsening neurologic problems.   Kayona Foor A. , MD, PhD 10/23/2020, 10:13 AM Certified in Neurology, Clinical Neurophysiology, Sleep Medicine, Pain Medicine and Neuroimaging  [facility] [address], [address] [place], [place] [phone]  -

## 2020-10-24 LAB — COMPREHENSIVE METABOLIC PANEL
ALT: 22 IU/L (ref 0–32)
AST: 21 IU/L (ref 0–40)
Albumin/Globulin Ratio: 2.1 (ref 1.2–2.2)
Albumin: 4.1 g/dL (ref 3.8–4.8)
Alkaline Phosphatase: 71 IU/L (ref 44–121)
BUN/Creatinine Ratio: 24 — ABNORMAL HIGH (ref 9–23)
BUN: 18 mg/dL (ref 6–24)
Bilirubin Total: 0.3 mg/dL (ref 0.0–1.2)
CO2: 26 mmol/L (ref 20–29)
Calcium: 8.9 mg/dL (ref 8.7–10.2)
Chloride: 100 mmol/L (ref 96–106)
Creatinine, Ser: 0.76 mg/dL (ref 0.57–1.00)
Globulin, Total: 2 g/dL (ref 1.5–4.5)
Glucose: 83 mg/dL (ref 65–99)
Potassium: 4.5 mmol/L (ref 3.5–5.2)
Sodium: 138 mmol/L (ref 134–144)
Total Protein: 6.1 g/dL (ref 6.0–8.5)
eGFR: 98 mL/min/{1.73_m2} (ref >59)

## 2020-10-24 LAB — CBC WITH DIFFERENTIAL/PLATELET
Basophils Absolute: 0 10*3/uL (ref 0.0–0.2)
Basos: 1 %
EOS (ABSOLUTE): 0 10*3/uL (ref 0.0–0.4)
Eos: 1 %
Hematocrit: 39.1 % (ref 34.0–46.6)
Hemoglobin: 13 g/dL (ref 11.1–15.9)
Immature Grans (Abs): 0 10*3/uL (ref 0.0–0.1)
Immature Granulocytes: 1 %
Lymphocytes Absolute: 0.2 10*3/uL — ABNORMAL LOW (ref 0.7–3.1)
Lymphs: 5 %
MCH: 30.5 pg (ref 26.6–33.0)
MCHC: 33.2 g/dL (ref 31.5–35.7)
MCV: 92 fL (ref 79–97)
Monocytes Absolute: 0.6 10*3/uL (ref 0.1–0.9)
Monocytes: 16 %
Neutrophils Absolute: 2.8 10*3/uL (ref 1.4–7.0)
Neutrophils: 76 %
Platelets: 319 10*3/uL (ref 150–450)
RBC: 4.26 x10E6/uL (ref 3.77–5.28)
RDW: 13 % (ref 11.7–15.4)
WBC: 3.5 10*3/uL (ref 3.4–10.8)

## 2020-10-27 ENCOUNTER — Telehealth: Payer: Self-pay | Admitting: *Deleted

## 2020-10-27 NOTE — Telephone Encounter (Signed)
Submitted PA on CMM. TIR:[id number]. Waiting on determination.

## 2020-10-29 NOTE — Telephone Encounter (Signed)
PA approved via [employer] 10/29/20-10/28/21. PA ref # [id number].

## 2020-12-23 ENCOUNTER — Other Ambulatory Visit: Payer: Self-pay | Admitting: Neurology

## 2020-12-23 ENCOUNTER — Telehealth: Payer: Self-pay | Admitting: Neurology

## 2020-12-23 MED ORDER — AMPHETAMINE-DEXTROAMPHETAMINE 20 MG PO TABS: 20.0000 mg | ORAL_TABLET | Freq: Two times a day (BID) | ORAL | 0 refills | Status: DC

## 2020-12-23 NOTE — Telephone Encounter (Signed)
Pt request refill amphetamine-dextroamphetamine (ADDERALL) 20 MG tablet at [facility] [phone]

## 2020-12-23 NOTE — Telephone Encounter (Signed)
I have routed this request to Dr Sater for review. The pt is due for the medication and [place] registry was verified.  

## 2020-12-25 ENCOUNTER — Other Ambulatory Visit: Payer: Self-pay | Admitting: Neurology

## 2021-02-14 ENCOUNTER — Other Ambulatory Visit: Payer: Self-pay | Admitting: Neurology

## 2021-02-16 NOTE — Telephone Encounter (Signed)
Patient is due for refill on Ambien.  Patient is up-to-date on her appointments.

## 2021-03-03 ENCOUNTER — Other Ambulatory Visit: Payer: Self-pay

## 2021-03-03 MED ORDER — AMPHETAMINE-DEXTROAMPHETAMINE 20 MG PO TABS: 20.0000 mg | ORAL_TABLET | Freq: Two times a day (BID) | ORAL | 0 refills | Status: DC

## 2021-04-14 ENCOUNTER — Other Ambulatory Visit: Payer: Self-pay

## 2021-04-14 MED ORDER — AMPHETAMINE-DEXTROAMPHETAMINE 20 MG PO TABS: 20.0000 mg | ORAL_TABLET | Freq: Two times a day (BID) | ORAL | 0 refills | Status: DC

## 2021-04-21 ENCOUNTER — Other Ambulatory Visit: Payer: Self-pay

## 2021-04-21 MED ORDER — FINGOLIMOD HCL 0.5 MG PO CAPS: 1.0000 | ORAL_CAPSULE | Freq: Every day | ORAL | 1 refills | Status: DC

## 2021-04-23 ENCOUNTER — Encounter: Payer: Self-pay | Admitting: *Deleted

## 2021-04-23 ENCOUNTER — Other Ambulatory Visit: Payer: Self-pay | Admitting: *Deleted

## 2021-04-23 MED ORDER — FINGOLIMOD HCL 0.5 MG PO CAPS: 1.0000 | ORAL_CAPSULE | Freq: Every day | ORAL | 3 refills | Status: DC

## 2021-04-30 ENCOUNTER — Ambulatory Visit: Payer: 59 | Admitting: Neurology

## 2021-05-28 ENCOUNTER — Encounter: Payer: Self-pay | Admitting: Neurology

## 2021-05-28 ENCOUNTER — Telehealth: Payer: Self-pay | Admitting: Neurology

## 2021-05-28 NOTE — Telephone Encounter (Signed)
Appt r/s from 3/1 to 3/7 at 3:30.

## 2021-06-01 ENCOUNTER — Other Ambulatory Visit: Payer: Self-pay | Admitting: Neurology

## 2021-06-01 MED ORDER — AMPHETAMINE-DEXTROAMPHETAMINE 20 MG PO TABS: 20.0000 mg | ORAL_TABLET | Freq: Two times a day (BID) | ORAL | 0 refills | Status: DC

## 2021-07-15 ENCOUNTER — Other Ambulatory Visit: Payer: Self-pay

## 2021-07-15 MED ORDER — BUPROPION HCL ER (XL) 300 MG PO TB24: ORAL_TABLET | ORAL | 3 refills | Status: DC

## 2021-07-24 ENCOUNTER — Other Ambulatory Visit: Payer: Self-pay | Admitting: Neurology

## 2021-07-24 MED ORDER — AMPHETAMINE-DEXTROAMPHETAMINE 20 MG PO TABS: 20.0000 mg | ORAL_TABLET | Freq: Two times a day (BID) | ORAL | 0 refills | Status: DC

## 2021-07-24 NOTE — Telephone Encounter (Signed)
Received refill request for adderll.  Last OV was on 10/23/20.  Next OV is scheduled for 08/25/21 .  Last RX was written on 06/01/21 for 60 tabs.   [place] Drug Database has been reviewed.

## 2021-08-11 ENCOUNTER — Encounter: Payer: Self-pay | Admitting: Neurology

## 2021-08-11 ENCOUNTER — Telehealth: Payer: Self-pay | Admitting: Neurology

## 2021-08-11 DIAGNOSIS — G35 Multiple sclerosis: Secondary | ICD-10-CM

## 2021-08-11 NOTE — Telephone Encounter (Addendum)
Tried faxing completed/signed urgent PA to [facility] at [phone]. Faxed failed x2.  I called [facility] at [phone]. Spoke w/ Victorino Dike. They received faxed PA request and it is pending review. Unsure why it advised [place] that fax failed. They will let us know outcome once available.

## 2021-08-11 NOTE — Telephone Encounter (Signed)
I called back. Rep directed me to website to get PA form to complete: [email or web address]. I printed form. In process of completing PA. Waiting on MD signature and then will fax in.   I called pt to get updated ID. Her new ID is [id number]. She will also upload front/back of new insurance card to [employer]. She does not have secondary insurance. Aware we will fax in completed PA, marked urgent.   She ran out of med recently. Skipped this past Sunday, took med Monday and is out today. She spoke w/ Gilenya go who should be shipping her med by Thurs/Friday. She just got application for PAP processed today. Aware I will reach back out once we get determination back from insurance. Did give her heads up that now that generic available, some insurances require trial/failure of generic before covering brand name. She verbalized understanding.

## 2021-08-11 NOTE — Telephone Encounter (Signed)
Sharice from [employer] called stating that the pt is needing a PA for the Gilenya Pt has [employer] [facility] [employer] and they use [facility] Please fax PA form to [phone] If any questions telephone is [phone] Once outcome is received please fax to [phone]

## 2021-08-12 MED ORDER — FINGOLIMOD HCL 0.5 MG PO CAPS
1.0000 | ORAL_CAPSULE | Freq: Every day | ORAL | 3 refills | Status: DC
Start: 2021-08-12 — End: 2021-08-13

## 2021-08-12 NOTE — Addendum Note (Signed)
Addended by: Arther Abbott on: 08/12/2021 11:20 AM   Modules accepted: Orders

## 2021-08-13 MED ORDER — FINGOLIMOD HCL 0.5 MG PO CAPS: 1.0000 | ORAL_CAPSULE | Freq: Every day | ORAL | 3 refills | Status: DC

## 2021-08-13 NOTE — Addendum Note (Signed)
Addended by: Arther Abbott on: 08/13/2021 11:43 AM   Modules accepted: Orders

## 2021-08-13 NOTE — Telephone Encounter (Signed)
PA Case: [id number]. Approved 08/13/2021 12:00 AM- 08/13/2022 12:00 AM. Questions? Contact [phone].  I called [facility] back to see if they can run test claim to see what cost would be. Spoke w/ Alexa. Provided PA approval info. Getting rejection from insurance side. She is reaching out to insurance to try and get this resolved, placed me on hold.  Contacted specialized team. Advised the med should be filled at [facility] specialty pharmacy. I e-scribed rx here instead. I sent update mychart to patient.

## 2021-08-13 NOTE — Telephone Encounter (Signed)
Called [facility] to see if PA needed for generic Gilenya. Spoke w/ Imogene Burn. They did not have new insurance on file for pt. I provided this new info. She placed me on hold to update info. Confirmed generic does need PA.   Initiated PA for generic on CMM. KeyMeredith Staggers - PA Case ID: [id number]. Waiting on determination from [employer].

## 2021-08-18 NOTE — Telephone Encounter (Signed)
Called and LVM relaying below info since pt has not ready mychart messages. Asked her to reach back out if she has any further questions/concerns.

## 2021-08-19 ENCOUNTER — Ambulatory Visit: Payer: 59 | Admitting: Neurology

## 2021-08-21 NOTE — Telephone Encounter (Signed)
[employer] [facility]) have provider give a call back in regards to PA for Gilenya ? ?Contact info: [phone] ?

## 2021-08-24 NOTE — Telephone Encounter (Addendum)
Called [employer] back and spoke w/ Ollie. Asking we complete PA for brand Gilenya. Advised we already did this and it was denied. Pt must try/fail generic. We sent this in and PA approved for generic. Pt has been unresponsive to mychart and calls.  ? ? ?I called pt at [phone]. She received message but was planning on discussing further at appt tomorrow. Advised appt tomorrow looks like it got cx. R/s for 08/27/21 at 9am w/ Dr. Epimenio Foot.  ? ?She will call [facility] [phone] to see what cost of generic Gilenya will cost her. If unaffordable, she will discuss other options with Dr. Epimenio Foot at appt on 08/27/21. ?

## 2021-08-25 ENCOUNTER — Ambulatory Visit: Payer: Self-pay | Admitting: Neurology

## 2021-08-27 ENCOUNTER — Ambulatory Visit: Payer: 59 | Admitting: Neurology

## 2021-08-27 ENCOUNTER — Encounter: Payer: Self-pay | Admitting: Neurology

## 2021-08-27 VITALS — BP 144/94 | HR 86 | Ht 63.0 in | Wt 200.0 lb

## 2021-08-27 DIAGNOSIS — G35 Multiple sclerosis: Secondary | ICD-10-CM

## 2021-08-27 DIAGNOSIS — F09 Unspecified mental disorder due to known physiological condition: Secondary | ICD-10-CM

## 2021-08-27 DIAGNOSIS — N319 Neuromuscular dysfunction of bladder, unspecified: Secondary | ICD-10-CM

## 2021-08-27 DIAGNOSIS — Z79899 Other long term (current) drug therapy: Secondary | ICD-10-CM | POA: Diagnosis not present

## 2021-08-27 DIAGNOSIS — R269 Unspecified abnormalities of gait and mobility: Secondary | ICD-10-CM

## 2021-08-27 DIAGNOSIS — R5383 Other fatigue: Secondary | ICD-10-CM

## 2021-08-27 DIAGNOSIS — Q057 Lumbar spina bifida without hydrocephalus: Secondary | ICD-10-CM | POA: Diagnosis not present

## 2021-08-27 MED ORDER — GABAPENTIN 300 MG PO CAPS: 300.0000 mg | ORAL_CAPSULE | Freq: Three times a day (TID) | ORAL | 3 refills | Status: DC

## 2021-08-27 MED ORDER — MIRABEGRON ER 50 MG PO TB24: 50.0000 mg | ORAL_TABLET | Freq: Every day | ORAL | 4 refills | Status: DC

## 2021-08-27 MED ORDER — ETODOLAC 400 MG PO TABS: 400.0000 mg | ORAL_TABLET | Freq: Two times a day (BID) | ORAL | 3 refills | Status: DC

## 2021-08-27 MED ORDER — METHYLPHENIDATE HCL 20 MG PO TABS: 20.0000 mg | ORAL_TABLET | Freq: Two times a day (BID) | ORAL | 0 refills | Status: DC

## 2021-08-27 NOTE — Progress Notes (Signed)
[facility]  PATIENT: Mary Beck DOB: 1976-04-22   _________________________________   HISTORICAL  CHIEF COMPLAINT:  Chief Complaint  Patient presents with   Follow-up    Pt alone, rm 2. Overall stable. States no issues or concerns. Pt is currently on Gilenya and had patient assistance but since the generic she is no longer able to get assistance and may need to discuss alternative therapy    HISTORY OF PRESENT ILLNESS:  Mary Beck is a 46 y.o. woman with relapsing remitting MS diagnosed in 2010.    Update 08/27/2021: She is tolerating Gilenya well for her RRMS.   She has no exacerbations but feels slightly weaker going upstairs..      Gait is fine but balance is a little reduced and she prefers to use the bannister on stairs.   She could walk 2 miles but is slower with less endurance than peers.    Strength is a little reduced in legs, ost noted in the mornings.  She has some spasticity in legs.  Legs are sometimes sore.     Vision is doing well.   She has a recent eye exam and was told she had no MS related issues.      Bladder function is poor    Myrbetriq is now covered and it helps her a lot and incontinence is now rarre.she does not have incontinence.  She is better on Myrbetriq 50 mg than solifenacin (vesicare) and better than oxybutynin.   Vision is fine.  She has fatigue daily which is helped by Adderall. It also helps mild cognitive issues - processing, word finding.  SHe had some difficulty with her last job.   Mood is ok with Wellbutrin but she still has mild depression.    Sleep is usually ok.   She takes zolpidem rarely for insomnia.      She take Vit D supplement daily.  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   She feels left leg weakness and pain is better.      MS HIstory:   She was diagnosed in 2010 after presenting with numbness in the feet up to  her knees. She had an MRI of the brain that was abnormal. This was followed by a lumbar puncture in the CSF was also abnormal. She was diagnosed with MS and was placed on Copaxone. She had an exacerbation and was switched to Betaseron. Due to needle fatigue she was switched to Gilenya about 2 years ago. Since being on Gilenya her MS has been stable. She denies any exacerbations. She tolerates Gilenya well. She had an MRI of the brain early 2015 that was unchanged compared to her previous MRI.   MRI May 2017 shows a new large chronic focus in the left frontal lobe not present in 2015. MRI of the cervical spine shows 3 T2 hyperintense foci  Imaging: MRI of the brain, cervical spine thoracic spine 06/16/2020 and 06/11/2020 was unchanged compared to the previous studies from 2019.  MRI brain 10/10/2017: 1.   Multiple T2/FLAIR hyperintense foci in the periventricular, juxtacortical and deep white matter of both hemispheres and in the right cerebellar hemisphere in a pattern and configuration consistent with chronic demyelinating plaque.  None of the foci appears to be acute.  When compared to the MRI dated  11/10/2015, there are no new lesions and 1 focus in the left frontal  lobe appears much smaller on the current study.   2.    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 3.   There are no acute findings  MRI cervical 10/10/2017: IMPRESSION: This MRI of the cervical spine with and without contrast shows the following: 1.    There are several foci within the spinal cord adjacent to C2, C4-C5, C5 and C7 consistent with chronic demyelinating plaque associated with multiple sclerosis.  All these foci were noted on the 11/10/2015 MRI and they do not enhance. 2.    At C3-C4 there is a small left paramedian disc bulge not present on the 2017 MRI.  There is no nerve root compression. 3.    At C5-C6 there is stable  disc bulging and mild bilateral foraminal narrowing but no nerve root compression. 4.    At C6-C7, there is stable disc protrusion and mild to moderate foraminal narrowing and mild spinal stenosis.  There is no nerve root compression. 5.    There is a normal enhancement pattern and there are no acute findings.Marland Kitchen        MRI thoracic 10/10/2017: IMPRESSION: This MRI of the thoracic spine without contrast shows the following: 1.    Anterior to central focus within the spinal cord adjacent to T8 and central focus within the spinal cord adjacent to T11.  These are nonspecific but consistent with transverse myelitis or demyelination.. 2.    Small syrinx from T5-T6 to T7-T8    MRI lumbar 10/10/2017: IMPRESSION: This MRI of the lumbar spine with and without contrast shows the following: 1.    Posterior decompression from L3-S1. 2.    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  This is unchanged when compared to the previous MRI. 3.    At L4-L5, there is left greater than right facet hypertrophy causing moderate left foraminal narrowing.  There is no definite nerve root compression though there is some encroachment upon the left L4 nerve root.  This appears stable. 4.    At L5-S1, there is severe right greater than left facet hypertrophy and a right synovial cyst causing severe right lateral recess stenosis.  This could lead to right S1 nerve root compression.  Changes appear stable compared to the 08/31/2016 MRI. 5.    There is mild epidural fibrosis to the right at L4-L5 and L5-S1.    MRI Brain 11/10/15 IMPRESSION:  This MRI of the brain with and without contrast shows the following: 1.    Scattered T2/FLAIR hyperintense foci in the hemispheres and right cerebellum in a pattern and configuration consistent with chronic demyelinating plaque associated with multiple sclerosis. None of the foci appears to be acute. However, when compared to the MRI dated  11/07/2013, one left frontal subcortical focus has occurred during the interim. 2,    There are no acute findings.  MRi Cervical 11/10/15 IMPRESSION:  This MRI of the cervical spine with and without contrast shows the following: 1.   There are three T2 hyperintense foci within the spinal cord as detailed above consistent with chronic demyelinating plaque associated with multiple sclerosis. None of the foci enhances after contrast administration. 2.    Mild degenerative changes at C5-C6 and C6-C7 that did not lead to any nerve root impingement. 3.    There is a normal enhancement pattern.  2017   (pre-surgical)  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 2. Posterior element fusion anomaly/spina bifida at L3 and below. 3. Advanced facet disease in the lower lumbar spine. 4. 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  Other data: She saw Norton Pastel, Psychologist for neurocognitive evaluation in 12/18/2015: Overall, Ms. Viscusi neurocognitive profile is consistent with a mild neurocognitive disorder (i.e., MCI), predominantly dysexecutive, with the combination of her multiple sclerosis and mood symptomology as likely etiologies.      REVIEW OF SYSTEMS: Constitutional: No fevers, chills, sweats, or change in appetite.   She reports fatigue Eyes: No visual changes, double vision, .  However, mild eye pain Ear, nose and throat: No hearing loss, ear pain, nasal congestion, sore throat Cardiovascular: No chest pain, palpitations Respiratory:  No shortness of breath at rest or with exertion.   No wheezes GastrointestinaI: No nausea, vomiting, diarrhea, abdominal pain, fecal incontinence Genitourinary:  No dysuria or urinary retention.  She notes frequency and nocturia. Musculoskeletal:  No neck pain.  Notes back  pain Integumentary: No rash, pruritus, skin lesions Neurological: as above.  Also, occasional Headache Psychiatric: Reports mild depression but little anxiety Endocrine: No palpitations, diaphoresis, change in appetite, change in weigh or increased thirst Hematologic/Lymphatic:  No anemia, purpura, petechiae. Allergic/Immunologic: No itchy/runny eyes, nasal congestion, recent allergic reactions, rashes  ALLERGIES: No Known Allergies  HOME MEDICATIONS:  Current Outpatient Medications:    buPROPion (WELLBUTRIN XL) 300 MG 24 hr tablet, TAKE ONE TABLET BY MOUTH DAILY-PLEASE KEEP UPCOMING APPT FOR CONTINUED REFILLS, Disp: 30 tablet, Rfl: 3   cholecalciferol (VITAMIN D) 1000 UNITS tablet, Take 50,000 Units by mouth daily. Take 1 capsules daily, Disp: , Rfl:    etodolac (LODINE) 400 MG tablet, Take 1 tablet (400 mg total) by mouth 2 (two) times daily., Disp: 180 tablet, Rfl: 3   Fingolimod HCl (GILENYA) 0.5 MG CAPS, Take 1 capsule (0.5 mg total) by mouth daily., Disp: 90 capsule, Rfl: 3   gabapentin (NEURONTIN) 300 MG capsule, Take 1 capsule (300 mg total) by mouth 3 (three) times daily., Disp: 270 capsule, Rfl: 3   ibuprofen (ADVIL,MOTRIN) 800 MG tablet, Take 1 tablet (800 mg total) by mouth every 8 (eight) hours as needed for mild pain., Disp: 30 tablet, Rfl: 0   loratadine (CLARITIN) 10 MG tablet, Take 10 mg by mouth daily., Disp: , Rfl:    methylphenidate (RITALIN) 20 MG tablet, Take 1 tablet (20 mg total) by mouth 2 (two) times daily., Disp: 60 tablet, Rfl: 0   PROAIR HFA 108 (90 Base) MCG/ACT inhaler, Inhale 2 puffs into the lungs every 6 (six) hours as needed for wheezing or shortness of breath., Disp: 6.7 g, Rfl: 1   zolpidem (AMBIEN) 5 MG tablet, TAKE ONE TABLET BY MOUTH EVERY NIGHT AT BEDTIME AS NEEDED FOR SLEEP, Disp: 30 tablet, Rfl: 5   mirabegron ER (MYRBETRIQ) 50 MG TB24 tablet, Take 1 tablet (50 mg total) by mouth daily., Disp: 90 tablet, Rfl: 4  PAST MEDICAL HISTORY: Past Medical  History:  Diagnosis Date   Headache    Multiple sclerosis ([place])    Pseudotumor cerebri    Spina bifida ([place])     PAST SURGICAL HISTORY: Past Surgical History:  Procedure Laterality Date   BACK SURGERY  2017    FAMILY HISTORY: Family History  Problem Relation Age  of Onset   Hypertension Mother    Thyroid disease Mother    Hypertension Father    Thyroid disease Sister    Cancer Maternal Grandmother        brain   Heart disease Maternal Grandfather    Multiple sclerosis Neg Hx     SOCIAL HISTORY:  Social History   Socioeconomic History   Marital status: Single    Spouse name: Not on file   Number of children: Not on file   Years of education: Not on file   Highest education level: Not on file  Occupational History   Not on file  Tobacco Use   Smoking status: Never   Smokeless tobacco: Never  Vaping Use   Vaping Use: Never used  Substance and Sexual Activity   Alcohol use: No    Alcohol/week: 0.0 standard drinks   Drug use: No   Sexual activity: Yes    Birth control/protection: None  Other Topics Concern   Not on file  Social History Narrative   Not on file   Social Determinants of Health   Financial Resource Strain: Not on file  Food Insecurity: Not on file  Transportation Needs: Not on file  Physical Activity: Not on file  Stress: Not on file  Social Connections: Not on file  Intimate Partner Violence: Not on file     PHYSICAL EXAM  Vitals:   08/27/21 0905  BP: (!) 144/94  Pulse: 86  Weight: 200 lb (90.7 kg)  Height: 5\' 3"  (1.6 m)    Body mass index is 35.43 kg/m.   General: The patient is well-developed and well-nourished and in no acute distress    Neurologic Exam  Mental status: The patient is alert and oriented x 3 at the time of the examination. The patient has apparent normal recent and remote memory, with an apparently normal attention span and concentration ability.   Speech is normal.  Cranial nerves: Extraocular movements  are full.     Facial symmetry is present. There is good facial sensation to soft touch bilaterally.Facial strength is normal.  Trapezius and sternocleidomastoid strength is normal. No dysarthria is noted.    No obvious hearing deficits are noted.  Motor:  Muscle bulk is normal.   Tone is normal. Strength is  5 / 5 in all 4 extremities . Can stand on toes and heesl  Sensory: She has mildly reduced touch and vibration sensation in the right hand compared to the left hand. Leg sensation was normal.   Coordination: Cerebellar testing reveals good finger-nose-finger and heel-to-shin bilaterally.  Gait and station: Station is normal.   Her gait is normal.  The tandem gait is wide.  Romberg is negative.   Reflexes: Deep tendon reflexes are normal and symmetric in the arms.  Deep tendon reflexes were increased at the knees.  There is a crossed adductor response on the left.  Asymmetry at the ankles, larger reflex on the left.    ASSESSMENT AND PLAN  1. Multiple sclerosis ([place])   2. MS (multiple sclerosis) ([place])   3. 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 Spina bifida of lumbosacral region without hydrocephalus (HCC)   5. Other fatigue   6. High risk medication use   7. Neurogenic bladder   8. Gait disturbance       1.   Continue Gilenya.  She is having issues with the co-pay but was able to get free drug.   We discussed options, including changing  to [place] or Zeposia.  We will check a CBC and CMP with differential today.    Check siponimod Cyt P450 2C9 2.   Continue Myrbetriq for bladder dysfunction.  It has helped her more than any other medication 3.   Stay active and exercise  4.    Change Adderall to Ritalin due to national shortage 5.    Due to her cognitive and physical issues and fatigue related to MS, she is disabled.. 6.    RTC  6 months or sooner if new or worsening neurologic problems.   Destani Wamser A. Felecia Shelling, MD, PhD [id number], [id number] AM Certified in Neurology,  Clinical Neurophysiology, Sleep Medicine, Pain Medicine and Neuroimaging  [facility] [address], [place], [place] [address] [phone]  -

## 2021-08-28 LAB — CBC WITH DIFFERENTIAL/PLATELET
Basophils Absolute: 0 10*3/uL (ref 0.0–0.2)
Basos: 1 %
EOS (ABSOLUTE): 0 10*3/uL (ref 0.0–0.4)
Eos: 1 %
Hematocrit: 38.3 % (ref 34.0–46.6)
Hemoglobin: 13 g/dL (ref 11.1–15.9)
Immature Grans (Abs): 0 10*3/uL (ref 0.0–0.1)
Immature Granulocytes: 0 %
Lymphocytes Absolute: 0.2 10*3/uL — ABNORMAL LOW (ref 0.7–3.1)
Lymphs: 6 %
MCH: 30.7 pg (ref 26.6–33.0)
MCHC: 33.9 g/dL (ref 31.5–35.7)
MCV: 90 fL (ref 79–97)
Monocytes Absolute: 0.5 10*3/uL (ref 0.1–0.9)
Monocytes: 14 %
Neutrophils Absolute: 2.8 10*3/uL (ref 1.4–7.0)
Neutrophils: 78 %
Platelets: 305 10*3/uL (ref 150–450)
RBC: 4.24 x10E6/uL (ref 3.77–5.28)
RDW: 12.9 % (ref 11.7–15.4)
WBC: 3.6 10*3/uL (ref 3.4–10.8)

## 2021-08-28 LAB — COMPREHENSIVE METABOLIC PANEL
ALT: 23 IU/L (ref 0–32)
AST: 21 IU/L (ref 0–40)
Albumin/Globulin Ratio: 1.8 (ref 1.2–2.2)
Albumin: 4 g/dL (ref 3.8–4.8)
Alkaline Phosphatase: 85 IU/L (ref 44–121)
BUN/Creatinine Ratio: 29 — ABNORMAL HIGH (ref 9–23)
BUN: 20 mg/dL (ref 6–24)
Bilirubin Total: 0.2 mg/dL (ref 0.0–1.2)
CO2: 24 mmol/L (ref 20–29)
Calcium: 8.8 mg/dL (ref 8.7–10.2)
Chloride: 102 mmol/L (ref 96–106)
Creatinine, Ser: 0.68 mg/dL (ref 0.57–1.00)
Globulin, Total: 2.2 g/dL (ref 1.5–4.5)
Glucose: 85 mg/dL (ref 70–99)
Potassium: 4.3 mmol/L (ref 3.5–5.2)
Sodium: 140 mmol/L (ref 134–144)
Total Protein: 6.2 g/dL (ref 6.0–8.5)
eGFR: 108 mL/min/{1.73_m2} (ref >59)

## 2021-10-02 ENCOUNTER — Telehealth: Payer: 59 | Admitting: Emergency Medicine

## 2021-10-02 DIAGNOSIS — J069 Acute upper respiratory infection, unspecified: Secondary | ICD-10-CM

## 2021-10-02 MED ORDER — FLUTICASONE PROPIONATE 50 MCG/ACT NA SUSP: 2.0000 | Freq: Every day | NASAL | 0 refills | Status: DC

## 2021-10-02 MED ORDER — BENZONATATE 100 MG PO CAPS: 100.0000 mg | ORAL_CAPSULE | Freq: Two times a day (BID) | ORAL | 0 refills | Status: DC | PRN

## 2021-10-02 NOTE — Progress Notes (Signed)
E-Visit for Upper Respiratory Infection   We are sorry you are not feeling well.  Here is how we plan to help!  Based on what you have shared with me, it looks like you may have a viral upper respiratory infection.  Upper respiratory infections are caused by a large number of viruses; however, rhinovirus is the most common cause.   Symptoms vary from person to person, with common symptoms including sore throat, cough, fatigue or lack of energy and feeling of general discomfort.  A low-grade fever of up to 100.4 may present, but is often uncommon.  Symptoms vary however, and are closely related to a person's age or underlying illnesses.  The most common symptoms associated with an upper respiratory infection are nasal discharge or congestion, cough, sneezing, headache and pressure in the ears and face.  These symptoms usually persist for about 3 to 10 days, but can last up to 2 weeks.  It is important to know that upper respiratory infections do not cause serious illness or complications in most cases.    Upper respiratory infections can be transmitted from person to person, with the most common method of transmission being a person's hands.  The virus is able to live on the skin and can infect other persons for up to 2 hours after direct contact.  Also, these can be transmitted when someone coughs or sneezes; thus, it is important to cover the mouth to reduce this risk.  To keep the spread of the illness at bay, good hand hygiene is very important.  This is an infection that is most likely caused by a virus. There are no specific treatments other than to help you with the symptoms until the infection runs its course.  We are sorry you are not feeling well.  Here is how we plan to help!   For nasal congestion, you may use an oral decongestants such as Mucinex D or if you have glaucoma or high blood pressure use plain Mucinex.  Saline nasal spray or nasal drops can help and can safely be used as often as  needed for congestion.  For your congestion, I have prescribed Fluticasone nasal spray one spray in each nostril twice a day  If you do not have a history of heart disease, hypertension, diabetes or thyroid disease, prostate/bladder issues or glaucoma, you may also use Sudafed to treat nasal congestion.  It is highly recommended that you consult with a pharmacist or your primary care physician to ensure this medication is safe for you to take.     If you have a cough, you may use cough suppressants such as Delsym and Robitussin.  If you have glaucoma or high blood pressure, you can also use Coricidin HBP.   For cough I have prescribed for you A prescription cough medication called Tessalon Perles 100 mg. You may take 1-2 capsules every 8 hours as needed for cough  Providers prescribe antibiotics to treat infections caused by bacteria. Antibiotics are very powerful in treating bacterial infections when they are used properly. To maintain their effectiveness, they should be used only when necessary. Overuse of antibiotics has resulted in the development of superbugs that are resistant to treatment!    After careful review of your answers, I would not recommend an antibiotic for your condition.  Antibiotics are not effective against viruses and therefore should not be used to treat them. Common examples of infections caused by viruses include colds and flu   If you have a   sore or scratchy throat, use a saltwater gargle-  to  teaspoon of salt dissolved in a 4-ounce to 8-ounce glass of warm water.  Gargle the solution for approximately 15-30 seconds and then spit.  It is important not to swallow the solution.  You can also use throat lozenges/cough drops and Chloraseptic spray to help with throat pain or discomfort.  Warm or cold liquids can also be helpful in relieving throat pain.  For headache, pain or general discomfort, you can use Ibuprofen or Tylenol as directed.   Some authorities believe that  zinc sprays or the use of Echinacea may shorten the course of your symptoms.   HOME CARE Only take medications as instructed by your medical team. Be sure to drink plenty of fluids. Water is fine as well as fruit juices, sodas and electrolyte beverages. You may want to stay away from caffeine or alcohol. If you are nauseated, try taking small sips of liquids. How do you know if you are getting enough fluid? Your urine should be a pale yellow or almost colorless. Get rest. Taking a steamy shower or using a humidifier may help nasal congestion and ease sore throat pain. You can place a towel over your head and breathe in the steam from hot water coming from a faucet. Using a saline nasal spray works much the same way. Cough drops, hard candies and sore throat lozenges may ease your cough. Avoid close contacts especially the very young and the elderly Cover your mouth if you cough or sneeze Always remember to wash your hands.   GET HELP RIGHT AWAY IF: You develop worsening fever. If your symptoms do not improve within 10 days You develop yellow or green discharge from your nose over 3 days. You have coughing fits You develop a severe head ache or visual changes. You develop shortness of breath, difficulty breathing or start having chest pain Your symptoms persist after you have completed your treatment plan  MAKE SURE YOU  Understand these instructions. Will watch your condition. Will get help right away if you are not doing well or get worse.  Thank you for choosing an e-visit.  Your e-visit answers were reviewed by a board certified advanced clinical practitioner to complete your personal care plan. Depending upon the condition, your plan could have included both over the counter or prescription medications.  Please review your pharmacy choice. Make sure the pharmacy is open so you can pick up prescription now. If there is a problem, you may contact your provider through MyChart  messaging and have the prescription routed to another pharmacy.  Your safety is important to us. If you have drug allergies check your prescription carefully.   For the next 24 hours you can use MyChart to ask questions about today's visit, request a non-urgent call back, or ask for a work or school excuse. You will get an email in the next two days asking about your experience. I hope that your e-visit has been valuable and will speed your recovery.    Approximately 5 minutes was used in reviewing the patient's chart, questionnaire, prescribing medications, and documentation.  

## 2021-10-04 ENCOUNTER — Telehealth: Payer: Medicaid Other | Admitting: Physician Assistant

## 2021-10-04 DIAGNOSIS — H109 Unspecified conjunctivitis: Secondary | ICD-10-CM

## 2021-10-04 DIAGNOSIS — B9689 Other specified bacterial agents as the cause of diseases classified elsewhere: Secondary | ICD-10-CM

## 2021-10-04 MED ORDER — AMOXICILLIN-POT CLAVULANATE 875-125 MG PO TABS: 1.0000 | ORAL_TABLET | Freq: Two times a day (BID) | ORAL | 0 refills | Status: DC

## 2021-10-04 NOTE — Progress Notes (Signed)

## 2021-10-13 ENCOUNTER — Telehealth: Payer: 59 | Admitting: Family Medicine

## 2021-10-13 DIAGNOSIS — R0602 Shortness of breath: Secondary | ICD-10-CM

## 2021-10-13 NOTE — Progress Notes (Signed)
Mary Beck  ? ?Has been treated for viral and bacterial sinus in last 2 weeks.  ?Needs in person assessment  ? ?Message sent via mychart ev  ?

## 2021-10-14 ENCOUNTER — Ambulatory Visit
Admission: RE | Admit: 2021-10-14 | Discharge: 2021-10-14 | Disposition: A | Discharge status: Home / Self Care | Payer: 59 | Source: Ambulatory Visit | Attending: Emergency Medicine | Admitting: Emergency Medicine

## 2021-10-14 VITALS — BP 126/81 | HR 92 | Temp 99.0°F | Resp 20

## 2021-10-14 DIAGNOSIS — J3089 Other allergic rhinitis: Secondary | ICD-10-CM

## 2021-10-14 DIAGNOSIS — J302 Other seasonal allergic rhinitis: Secondary | ICD-10-CM | POA: Diagnosis not present

## 2021-10-14 DIAGNOSIS — J989 Respiratory disorder, unspecified: Secondary | ICD-10-CM | POA: Diagnosis not present

## 2021-10-14 DIAGNOSIS — T7840XA Allergy, unspecified, initial encounter: Secondary | ICD-10-CM

## 2021-10-14 MED ORDER — METHYLPREDNISOLONE 4 MG PO TBPK: ORAL_TABLET | ORAL | 0 refills | Status: DC

## 2021-10-14 NOTE — Discharge Instructions (Signed)
Please continue either Claritin (loratadine) or Allegra (fexofenadine) daily, I do not recommend that you continue taking both as this will not provide any extra benefit. ? ?Please use Flonase daily as prescribed to dry up mucous membranes, help decrease the amount of fluid collecting behind your eardrums causing you to have an increased pressure sensation in your ears and eliminate postnasal drip. ? ?Please begin methylprednisolone.  Please take 1 full row tablets daily with food starting tomorrow morning.  I recommend that people take this early in the day because some people can feel very jumpy and find it difficult to sleep when it is taken later in the day. ? ?If you find that your symptoms have significantly improved after 2 to 3 days of the steroids, please do not feel obligated to finish the full course.  The purpose of steroid treatment is to reduce inflammation and when you have achieved that, it is no longer need. ? ?Thank you for visiting urgent care today.  I appreciate the opportunity to participate in your care. ?

## 2021-10-14 NOTE — ED Triage Notes (Signed)
Patient states she was diagnosed with a sinus infection about two weeks ago but the symptoms have gotten worse. She c/o nasal congestion, sore throat, SOB at night, bilateral ear pain and redness to her right eye.  ?

## 2021-10-14 NOTE — ED Provider Notes (Signed)
?[facility] ? ? ? ?CSN: [id number] ?Arrival date & time: 10/14/21  1438 ?  ? ?HISTORY  ? ?Chief Complaint  ?Patient presents with  ? Nasal Congestion  ?  Previously diagnosed with sinus infection, prescribed antibiotics and improved but symptoms have lingered beyond completion of antibiotics, and are worsening.Symptoms included nasal congestion, sore throat, sneezing and shortness of breath. - Entered by patient  ? Sore Throat  ? Otalgia  ? ?HPI ?Mary Beck is a 46 y.o. female. Patient states she was diagnosed with a sinus infection about two weeks ago during an ED visit and was provided with a 7-day course of Augmentin, Tessalon Perles and Flonase.  Patient states she has not really needed the [facility] because she is not coughing that much.  Patient states she did not start Flonase.  Patient states she finished the Augmentin and initially had improvement for about 4 days but after that her symptoms worsened. She c/o nasal congestion, sore throat, SOB at night which she further describes as a sensation of not being able to fully exhale but cannot inhale freely, bilateral ear pain without hearing loss and redness in her right eye.  Patient states she takes an over-the-counter version of Zyrtec for allergies at this time but did not start Flonase as recommended by the E-visit provider 2 weeks ago. ? ?The history is provided by the patient.  ?Past Medical History:  ?Diagnosis Date  ? Headache   ? Multiple sclerosis ([place])   ? Pseudotumor cerebri   ? Spina bifida ([place])   ? ?Patient Active Problem List  ? Diagnosis Date Noted  ? Gait disturbance 08/27/2021  ? 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 Spina bifida of lumbosacral region without hydrocephalus ([place]) 07/19/2019  ? Right carpal tunnel syndrome 07/19/2019  ? Vitamin D deficiency 01/16/2019  ? History of lumbar surgery 09/07/2017  ? 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 High risk medication use  03/11/2016  ? Neurogenic bladder 03/04/2016  ? Urinary incontinence without sensory awareness 03/04/2016  ? Major depressive disorder, recurrent episode, moderate ([place]) 03/02/2016  ? Synovial cyst of lumbar facet joint 10/21/2015  ? Spina bifida occulta 10/21/2015  ? Back pain, chronic 09/19/2015  ? Insomnia 09/19/2015  ? Allergic rhinitis, seasonal 09/19/2015  ? Right-sided low back pain with right-sided sciatica 01/15/2015  ? Leg pain 01/15/2015  ? Increased frequency of urination 08/07/2014  ? Other fatigue 08/07/2014  ? Depression 08/07/2014  ? Fatigue 08/07/2014  ? MS (multiple sclerosis) ([place]) 09/17/2011  ? Multiple sclerosis ([place]) 09/17/2011  ? ?Past Surgical History:  ?Procedure Laterality Date  ? BACK SURGERY  2017  ? ?OB History   ?No obstetric history on file. ?  ? ?Home Medications   ? ?Prior to Admission medications   ?Medication Sig Start Date End Date Taking? Authorizing Provider  ?methylPREDNISolone (MEDROL DOSEPAK) 4 MG TBPK tablet Take 24 mg on day 1, 20 mg on day 2, 16 mg on day 3, 12 mg on day 4, 8 mg on day 5, 4 mg on day 6. 10/14/21  Yes Lynden Oxford Scales, PA-C  ?benzonatate (TESSALON) 100 MG capsule Take 1 capsule (100 mg total) by mouth 2 (two) times daily as needed for cough. 10/02/21   Montine Circle, PA-C  ?buPROPion (WELLBUTRIN XL) 300 MG 24 hr tablet TAKE ONE TABLET BY MOUTH DAILY-PLEASE KEEP UPCOMING APPT FOR CONTINUED REFILLS 07/15/21   Lomax, Amy, NP  ?cholecalciferol (VITAMIN D) 1000 UNITS tablet Take 50,000  Units by mouth daily. Take 1 capsules daily    [provider]  ?etodolac (LODINE) 400 MG tablet Take 1 tablet (400 mg total) by mouth 2 (two) times daily. 08/27/21   Sater, Nanine Means, MD  ?Fingolimod HCl (GILENYA) 0.5 MG CAPS Take 1 capsule (0.5 mg total) by mouth daily. 08/13/21   Sater, Nanine Means, MD  ?fluticasone (FLONASE) 50 MCG/ACT nasal spray Place 2 sprays into both nostrils daily. 10/02/21   Montine Circle, PA-C  ?gabapentin (NEURONTIN) 300 MG capsule Take 1  capsule (300 mg total) by mouth 3 (three) times daily. 08/27/21   Sater, Nanine Means, MD  ?ibuprofen (ADVIL,MOTRIN) 800 MG tablet Take 1 tablet (800 mg total) by mouth every 8 (eight) hours as needed for mild pain. 05/18/16   Ward, Delice Bison, DO  ?loratadine (CLARITIN) 10 MG tablet Take 10 mg by mouth daily.    [provider]  ?methylphenidate (RITALIN) 20 MG tablet Take 1 tablet (20 mg total) by mouth 2 (two) times daily. 08/27/21   Sater, Nanine Means, MD  ?mirabegron ER (MYRBETRIQ) 50 MG TB24 tablet Take 1 tablet (50 mg total) by mouth daily. 08/27/21   Sater, Nanine Means, MD  ?PROAIR HFA 108 [id number] Base) MCG/ACT inhaler Inhale 2 puffs into the lungs every 6 (six) hours as needed for wheezing or shortness of breath. 06/19/15   Sater, Nanine Means, MD  ?zolpidem (AMBIEN) 5 MG tablet TAKE ONE TABLET BY MOUTH EVERY NIGHT AT BEDTIME AS NEEDED FOR SLEEP 02/16/21   Sater, Nanine Means, MD  ? ?Family History ?Family History  ?Problem Relation Age of Onset  ? Hypertension Mother   ? Thyroid disease Mother   ? Hypertension Father   ? Thyroid disease Sister   ? Cancer Maternal Grandmother   ?     brain  ? Heart disease Maternal Grandfather   ? Multiple sclerosis Neg Hx   ? ?Social History ?Social History  ? ?Tobacco Use  ? Smoking status: Never  ? Smokeless tobacco: Never  ?Vaping Use  ? Vaping Use: Never used  ?Substance Use Topics  ? Alcohol use: No  ?  Alcohol/week: 0.0 standard drinks  ? Drug use: No  ? ?Allergies   ?Patient has no known allergies. ? ?Review of Systems ?Review of Systems ?Pertinent findings noted in history of present illness.  ? ?Physical Exam ?Triage Vital Signs ?ED Triage Vitals  ?Enc Vitals Group  ?   BP 04/17/21 0827 (!) 147/82  ?   Pulse Rate 04/17/21 0827 72  ?   Resp 04/17/21 0827 18  ?   Temp 04/17/21 0827 98.3 ?F (36.8 ?C)  ?   Temp Source 04/17/21 0827 Oral  ?   SpO2 04/17/21 0827 98 %  ?   Weight --   ?   Height --   ?   Head Circumference --   ?   Peak Flow --   ?   Pain Score 04/17/21 0826 5  ?   Pain  Loc --   ?   Pain Edu? --   ?   Excl. in [place]? --   ?No data found. ? ?Updated Vital Signs ?BP 126/81 (BP Location: Right Arm)   Pulse 92   Temp 99 ?F (37.2 ?C) (Oral)   Resp 20   LMP 09/23/2021   SpO2 97%  ? ?Physical Exam ?Vitals and nursing note reviewed.  ?Constitutional:   ?   General: She is not in acute distress. ?   Appearance: Normal  appearance. She is not ill-appearing.  ?HENT:  ?   Head: Normocephalic and atraumatic.  ?   Salivary Glands: Right salivary gland is not diffusely enlarged or tender. Left salivary gland is not diffusely enlarged or tender.  ?   Right Ear: Ear canal and external ear normal. No drainage. A middle ear effusion is present. There is no impacted cerumen. Tympanic membrane is bulging. Tympanic membrane is not injected or erythematous.  ?   Left Ear: Ear canal and external ear normal. No drainage. A middle ear effusion is present. There is no impacted cerumen. Tympanic membrane is bulging. Tympanic membrane is not injected or erythematous.  ?   Ears:  ?   Comments: Bilateral EACs normal, both TMs bulging with clear fluid ?   Nose: Rhinorrhea present. No nasal deformity, septal deviation, signs of injury, nasal tenderness, mucosal edema or congestion. Rhinorrhea is clear.  ?   Right Nostril: Occlusion present. No foreign body, epistaxis or septal hematoma.  ?   Left Nostril: Occlusion present. No foreign body, epistaxis or septal hematoma.  ?   Right Turbinates: Enlarged, swollen and pale.  ?   Left Turbinates: Enlarged, swollen and pale.  ?   Right Sinus: No maxillary sinus tenderness or frontal sinus tenderness.  ?   Left Sinus: No maxillary sinus tenderness or frontal sinus tenderness.  ?   Mouth/Throat:  ?   Lips: Pink. No lesions.  ?   Mouth: Mucous membranes are moist. No oral lesions.  ?   Pharynx: Oropharynx is clear. Uvula midline. No posterior oropharyngeal erythema or uvula swelling.  ?   Tonsils: No tonsillar exudate. 0 on the right. 0 on the left.  ?   Comments:  Postnasal drip ?Eyes:  ?   General: Lids are normal.     ?   Right eye: No discharge.     ?   Left eye: No discharge.  ?   Extraocular Movements: Extraocular movements intact.  ?   Conjunctiva/sclera: Conjunctivae

## 2021-10-16 ENCOUNTER — Other Ambulatory Visit: Payer: Self-pay | Admitting: Neurology

## 2021-10-19 MED ORDER — METHYLPHENIDATE HCL 20 MG PO TABS: 20.0000 mg | ORAL_TABLET | Freq: Two times a day (BID) | ORAL | 0 refills | Status: DC

## 2021-10-19 NOTE — Telephone Encounter (Signed)
Last OV was on 08/27/21.  ?Next OV is scheduled for 03/03/22.  ?Last RX was written on 08/27/21 for 60 tabs.  ? ?[place] [facility] Database has been reviewed.  ?

## 2021-10-28 ENCOUNTER — Telehealth: Payer: 59 | Admitting: Physician Assistant

## 2021-10-28 DIAGNOSIS — J301 Allergic rhinitis due to pollen: Secondary | ICD-10-CM | POA: Diagnosis not present

## 2021-10-28 MED ORDER — ALBUTEROL SULFATE HFA 108 (90 BASE) MCG/ACT IN AERS: 2.0000 | INHALATION_SPRAY | Freq: Four times a day (QID) | RESPIRATORY_TRACT | 0 refills | Status: DC | PRN

## 2021-10-28 MED ORDER — LEVOCETIRIZINE DIHYDROCHLORIDE 5 MG PO TABS: 5.0000 mg | ORAL_TABLET | Freq: Every evening | ORAL | 0 refills | Status: DC

## 2021-10-28 MED ORDER — OLOPATADINE HCL 0.1 % OP SOLN: 1.0000 [drp] | Freq: Two times a day (BID) | OPHTHALMIC | 0 refills | Status: DC

## 2021-10-28 MED ORDER — IPRATROPIUM BROMIDE 0.03 % NA SOLN: 2.0000 | Freq: Two times a day (BID) | NASAL | 0 refills | Status: DC

## 2021-10-28 NOTE — Progress Notes (Signed)
E visit for Allergic Rhinitis ?We are sorry that you are not feeling well.  Here is how we plan to help! ? ?Based on what you have shared with me it looks like you have Allergic Rhinitis.  Rhinitis is when a reaction occurs that causes nasal congestion, runny nose, sneezing, and itching.  Most types of rhinitis are caused by an inflammation and are associated with symptoms in the eyes ears or throat. ?There are several types of rhinitis.  The most common are acute rhinitis, which is usually caused by a viral illness, allergic or seasonal rhinitis, and nonallergic or year-round rhinitis.  Nasal allergies occur certain times of the year.  Allergic rhinitis is caused when allergens in the air trigger the release of histamine in the body.  Histamine causes itching, swelling, and fluid to build up in the fragile linings of the nasal passages, sinuses and eyelids.  An itchy nose and clear discharge are common. ? ?I recommend the following over the counter treatments: ?You should take a daily dose of antihistamine and Xyzal 5 mg take 1 tablet daily ? ?I also would recommend a nasal spray: ?Saline 1 spray into each nostril as needed and Ipratropium nasal spray. If you are using Fluticasone (Flonase) that was prescribed on 10/02/21, please continue this as well.  ? ?You may also benefit from eye drops such as: ?Pataday eye drops ? ?All of these medications will be prescribed. ? ?HOME CARE: ? ?You can use an over-the-counter saline nasal spray as needed ?Avoid areas where there is heavy dust, mites, or molds ?Stay indoors on windy days during the pollen season ?Keep windows closed in home, at least in bedroom; use air conditioner. ?Use high-efficiency house air filter ?Keep windows closed in car, turn [facility] on re-circulate ?Avoid playing out with dog during pollen season ? ?GET HELP RIGHT AWAY IF: ? ?If your symptoms do not improve within 10 days ?You become short of breath ?You develop yellow or green discharge from your nose  for over 3 days ?You have coughing fits ? ?MAKE SURE YOU: ? ?Understand these instructions ?Will watch your condition ?Will get help right away if you are not doing well or get worse ? ?Thank you for choosing an e-visit. ?Your e-visit answers were reviewed by a board certified advanced clinical practitioner to complete your personal care plan. Depending upon the condition, your plan could have included both over the counter or prescription medications. ?Please review your pharmacy choice. Be sure that the pharmacy you have chosen is open so that you can pick up your prescription now.  If there is a problem you may message your provider in MyChart to have the prescription routed to another pharmacy. ?Your safety is important to [place]. If you have drug allergies check your prescription carefully.  ?For the next 24 hours, you can use MyChart to ask questions about today?s visit, request a non-urgent call back, or ask for a work or school excuse from your e-visit provider. ?You will get an email in the next two days asking about your experience. I hope that your e-visit has been valuable and will speed your recovery. ? ? ? ?I provided 5 minutes of non face-to-face time during this encounter for chart review and documentation.  ? ?

## 2021-10-28 NOTE — Addendum Note (Signed)
Addended by: Mar Daring on: 10/28/2021 09:56 AM ? ? Modules accepted: Orders ? ?

## 2021-11-17 ENCOUNTER — Other Ambulatory Visit: Payer: Self-pay

## 2021-11-17 MED ORDER — BUPROPION HCL ER (XL) 300 MG PO TB24: 300.0000 mg | ORAL_TABLET | Freq: Every day | ORAL | 5 refills | Status: DC

## 2021-11-18 ENCOUNTER — Other Ambulatory Visit: Payer: Self-pay | Admitting: Neurology

## 2021-11-18 MED ORDER — METHYLPHENIDATE HCL 20 MG PO TABS: 20.0000 mg | ORAL_TABLET | Freq: Two times a day (BID) | ORAL | 0 refills | Status: DC

## 2022-01-19 ENCOUNTER — Other Ambulatory Visit: Payer: Self-pay | Admitting: Neurology

## 2022-01-20 MED ORDER — METHYLPHENIDATE HCL 20 MG PO TABS: 20.0000 mg | ORAL_TABLET | Freq: Two times a day (BID) | ORAL | 0 refills | Status: DC

## 2022-02-26 ENCOUNTER — Other Ambulatory Visit: Payer: Self-pay | Admitting: Neurology

## 2022-02-28 MED ORDER — METHYLPHENIDATE HCL 20 MG PO TABS: 20.0000 mg | ORAL_TABLET | Freq: Two times a day (BID) | ORAL | 0 refills | Status: DC

## 2022-03-03 ENCOUNTER — Ambulatory Visit: Payer: 59 | Admitting: Family Medicine

## 2022-03-10 ENCOUNTER — Encounter: Payer: Self-pay | Admitting: Neurology

## 2022-03-10 NOTE — Telephone Encounter (Signed)
Submitted urgent PA on CMM. Key: [id number]. Waiting on determination from [place].

## 2022-05-04 ENCOUNTER — Other Ambulatory Visit: Payer: Self-pay

## 2022-05-04 ENCOUNTER — Other Ambulatory Visit: Payer: Self-pay | Admitting: Neurology

## 2022-05-04 MED ORDER — METHYLPHENIDATE HCL 20 MG PO TABS: 20.0000 mg | ORAL_TABLET | Freq: Two times a day (BID) | ORAL | 0 refills | Status: DC

## 2022-05-04 MED ORDER — BUPROPION HCL ER (XL) 300 MG PO TB24: 300.0000 mg | ORAL_TABLET | Freq: Every day | ORAL | 5 refills | Status: DC

## 2022-05-17 ENCOUNTER — Other Ambulatory Visit: Payer: Self-pay | Admitting: Neurology

## 2022-05-17 ENCOUNTER — Encounter: Payer: Self-pay | Admitting: Neurology

## 2022-05-17 MED ORDER — METHYLPHENIDATE HCL 20 MG PO TABS: 20.0000 mg | ORAL_TABLET | Freq: Two times a day (BID) | ORAL | 0 refills | Status: DC

## 2022-05-17 NOTE — Telephone Encounter (Signed)
Submitted PA Myrbetriq on covermymeds. Key: [id number]. Waiting on determination from [place].

## 2022-06-01 ENCOUNTER — Other Ambulatory Visit: Payer: Self-pay | Admitting: *Deleted

## 2022-06-01 MED ORDER — DARIFENACIN HYDROBROMIDE ER 15 MG PO TB24: 15.0000 mg | ORAL_TABLET | Freq: Every day | ORAL | 5 refills | Status: DC

## 2022-06-01 NOTE — Addendum Note (Signed)
Addended by: Arther Abbott on: 06/01/2022 05:17 PM   Modules accepted: Orders

## 2022-06-01 NOTE — Telephone Encounter (Signed)
Called pt back and LVM. Per Dr. Epimenio Foot, he recommends darifenacin ER 15mg  po qd instead of Myrbetriq since its not covered. I e-scribed to . Asked her to call back if she has any questions.

## 2022-06-01 NOTE — Telephone Encounter (Signed)
Called pt at [phone] since she has not read mychart message yet. She confirmed she has only tried/failed oxybutynin and solifenacin. Aware I will speak with MD to see if there is another option on formulary list that is comparable to Myrbetriq 50mg . I will then call her back once I hear from him. She verbalized understanding and appreciation.

## 2022-06-28 ENCOUNTER — Ambulatory Visit: Payer: Commercial Managed Care - HMO | Admitting: Family Medicine

## 2022-06-28 ENCOUNTER — Encounter: Payer: Self-pay | Admitting: Family Medicine

## 2022-06-28 VITALS — BP 143/95 | HR 103 | Ht 63.0 in | Wt 203.0 lb

## 2022-06-28 DIAGNOSIS — Z79899 Other long term (current) drug therapy: Secondary | ICD-10-CM | POA: Diagnosis not present

## 2022-06-28 DIAGNOSIS — N319 Neuromuscular dysfunction of bladder, unspecified: Secondary | ICD-10-CM

## 2022-06-28 DIAGNOSIS — R5383 Other fatigue: Secondary | ICD-10-CM | POA: Diagnosis not present

## 2022-06-28 DIAGNOSIS — Q057 Lumbar spina bifida without hydrocephalus: Secondary | ICD-10-CM

## 2022-06-28 DIAGNOSIS — F09 Unspecified mental disorder due to known physiological condition: Secondary | ICD-10-CM

## 2022-06-28 DIAGNOSIS — G35 Multiple sclerosis: Secondary | ICD-10-CM | POA: Diagnosis not present

## 2022-06-28 DIAGNOSIS — E559 Vitamin D deficiency, unspecified: Secondary | ICD-10-CM

## 2022-06-28 DIAGNOSIS — R350 Frequency of micturition: Secondary | ICD-10-CM

## 2022-06-28 MED ORDER — BUPROPION HCL ER (XL) 300 MG PO TB24: 300.0000 mg | ORAL_TABLET | Freq: Every day | ORAL | 3 refills | Status: DC

## 2022-06-28 MED ORDER — DARIFENACIN HYDROBROMIDE ER 15 MG PO TB24: 15.0000 mg | ORAL_TABLET | Freq: Every day | ORAL | 1 refills | Status: DC

## 2022-06-28 MED ORDER — GABAPENTIN 300 MG PO CAPS: 300.0000 mg | ORAL_CAPSULE | Freq: Three times a day (TID) | ORAL | 3 refills | Status: DC

## 2022-06-28 MED ORDER — ETODOLAC 200 MG PO CAPS: 400.0000 mg | ORAL_CAPSULE | Freq: Two times a day (BID) | ORAL | 3 refills | Status: DC

## 2022-06-28 NOTE — Patient Instructions (Signed)

## 2022-06-28 NOTE — Progress Notes (Signed)
Chief Complaint  Patient presents with   Follow-up    Room 2,  thinks she may have kidney stone, doing okay from MS standpoint.    HISTORY OF PRESENT ILLNESS:  06/28/22 ALL: MASSA PE is a 47 y.o. female here today for follow up for RRMS. She continues generic Gilenya. Last MR brian, cervical and thoracic  spine stable 05/2020.   She feels that she is doing about the same. No major changes in gait, strength or vision. She is due for annual eye exam. Back pain is unchanged and manageable. She continues etodolac 400mg  BID PRN (usually 2-3 times a week) and gabapentin 300mg  TID PRN (usually once weekly).   Mood is stable on Wellbutrin. She is sleeping well. She rarely uses Ambien. Adderall changed to methylphenidate 20mg  BID due to shortage.   Vesicare not effective and Myrbetric not covered.  Darifenacin called in 04/2022 but she has not started this. She feels urinary frequency has been worse. She is concerned she may have a kidney stone. She is followed by urology in [place].   She continues Vit d supplements 2000iu OTC.   HISTORY (copied from Dr previous note)  Iliyana Convey is a 47 y.o. woman with relapsing remitting MS diagnosed in 2010.     Update 08/27/2021: She is tolerating Gilenya well for her RRMS.   She has no exacerbations but feels slightly weaker going upstairs..       Gait is fine but balance is a little reduced and she prefers to use the bannister on stairs.   She could walk 2 miles but is slower with less endurance than peers.    Strength is a little reduced in legs, ost noted in the mornings.  She has some spasticity in legs.  Legs are sometimes sore.      Vision is doing well.   She has a recent eye exam and was told she had no MS related issues.       Bladder function is poor    Myrbetriq is now covered and it helps her a lot and incontinence is now rarre.she does not have incontinence.  She is better on Myrbetriq 50 mg than solifenacin (vesicare) and  better than oxybutynin.   Vision is fine.   She has fatigue daily which is helped by Adderall. It also helps mild cognitive issues - processing, word finding.  SHe had some difficulty with her last job.   Mood is ok with Wellbutrin but she still has mild depression.     Sleep is usually ok.   She takes zolpidem rarely for insomnia.       She take Vit D supplement daily.  Vit D was 04/03/2020 was 49.9   Lower back pain:   She had surgery 05/2016 in [facility] for the tethered cord and synovial cyst.   She feels left leg weakness and pain is better.        MS HIstory:   She was diagnosed in 2010 after presenting with numbness in the feet up to her knees. She had an MRI of the brain that was abnormal. This was followed by a lumbar puncture in the CSF was also abnormal. She was diagnosed with MS and was placed on Copaxone. She had an exacerbation and was switched to Betaseron. Due to needle fatigue she was switched to Gilenya about 2 years ago. Since being on Gilenya her MS has been stable. She denies any exacerbations. She tolerates Gilenya well. She had an  MRI of the brain early 2015 that was unchanged compared to her previous MRI.   MRI May 2017 shows a new large chronic focus in the left frontal lobe not present in 2015. MRI of the cervical spine shows 3 T2 hyperintense foci   Imaging: MRI of the brain, cervical spine thoracic spine 06/16/2020 and 06/11/2020 was unchanged compared to the previous studies from 2019.   MRI brain 10/10/2017: 1.   Multiple T2/FLAIR hyperintense foci in the periventricular, juxtacortical and deep white matter of both hemispheres and in the right cerebellar hemisphere in a pattern and configuration consistent with chronic demyelinating plaque.  None of the foci appears to be acute.  When compared to the MRI dated  11/10/2015, there are no new lesions and 1 focus in the left frontal lobe appears much smaller on the current study.   2.    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 3.   There are no acute findings   MRI cervical 10/10/2017: IMPRESSION: This MRI of the cervical spine with and without contrast shows the following: 1.    There are several foci within the spinal cord adjacent to C2, C4-C5, C5 and C7 consistent with chronic demyelinating plaque associated with multiple sclerosis.  All these foci were noted on the 11/10/2015 MRI and they do not enhance. 2.    At C3-C4 there is a small left paramedian disc bulge not present on the 2017 MRI.  There is no nerve root compression. 3.    At C5-C6 there is stable disc bulging and mild bilateral foraminal narrowing but no nerve root compression. 4.    At C6-C7, there is stable disc protrusion and mild to moderate foraminal narrowing and mild spinal stenosis.  There is no nerve root compression. 5.    There is a normal enhancement pattern and there are no acute findings.Marland Kitchen        MRI thoracic 10/10/2017: IMPRESSION: This MRI of the thoracic spine without contrast shows the following: 1.    Anterior to central focus within the spinal cord adjacent to T8 and central focus within the spinal cord adjacent to T11.  These are nonspecific but consistent with transverse myelitis or demyelination.. 2.    Small syrinx from T5-T6 to T7-T8    MRI lumbar 10/10/2017: IMPRESSION: This MRI of the lumbar spine with and without contrast shows the following: 1.    Posterior decompression from L3-S1. 2.    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  This is unchanged when compared to the previous MRI. 3.    At L4-L5, there is left greater than right facet hypertrophy causing moderate left foraminal narrowing.  There is no definite nerve root compression though there is some encroachment upon the left L4 nerve root.  This appears stable. 4.    At L5-S1, there is severe right  greater than left facet hypertrophy and a right synovial cyst causing severe right lateral recess stenosis.  This could lead to right S1 nerve root compression.  Changes appear stable compared to the 08/31/2016 MRI. 5.    There is mild epidural fibrosis to the right at L4-L5 and L5-S1.     MRI Brain 11/10/15 IMPRESSION:  This MRI of the brain with and without contrast shows the following: 1.    Scattered T2/FLAIR hyperintense foci in the hemispheres and  right cerebellum in a pattern and configuration consistent with chronic demyelinating plaque associated with multiple sclerosis. None of the foci appears to be acute. However, when compared to the MRI dated 11/07/2013, one left frontal subcortical focus has occurred during the interim. 2,    There are no acute findings.   MRi Cervical 11/10/15 IMPRESSION:  This MRI of the cervical spine with and without contrast shows the following: 1.   There are three T2 hyperintense foci within the spinal cord as detailed above consistent with chronic demyelinating plaque associated with multiple sclerosis. None of the foci enhances after contrast administration. 2.    Mild degenerative changes at C5-C6 and C6-C7 that did not lead to any nerve root impingement. 3.    There is a normal enhancement pattern.   2017   (pre-surgical) 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 2. Posterior element fusion anomaly/spina bifida at L3 and below. 3. Advanced facet disease in the lower lumbar spine. 4. 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   Other data: She saw Orie Fisherman, Psychologist for neurocognitive evaluation in 12/18/2015: Overall, Ms. Faires's neurocognitive profile is consistent with a mild neurocognitive disorder (i.e., MCI), predominantly dysexecutive, with the combination of  her multiple sclerosis and mood symptomology as likely etiologies.       REVIEW OF SYSTEMS: Out of a complete 14 system review of symptoms, the patient complains only of the following symptoms, fatigue, depression, chronic back pain, intermittent insomnia, and all other reviewed systems are negative.   ALLERGIES: No Known Allergies   HOME MEDICATIONS: Outpatient Medications Prior to Visit  Medication Sig Dispense Refill   albuterol (VENTOLIN HFA) 108 (90 Base) MCG/ACT inhaler Inhale 2 puffs into the lungs every 6 (six) hours as needed for wheezing or shortness of breath. 8 g 0   Fingolimod HCl (GILENYA) 0.5 MG CAPS Take 1 capsule (0.5 mg total) by mouth daily. 90 capsule 3   fluticasone (FLONASE) 50 MCG/ACT nasal spray Place 2 sprays into both nostrils daily. 9.9 mL 0   levocetirizine (XYZAL) 5 MG tablet Take 1 tablet (5 mg total) by mouth every evening. 30 tablet 0   methylphenidate (RITALIN) 20 MG tablet Take 1 tablet (20 mg total) by mouth 2 (two) times daily. 60 tablet 0   zolpidem (AMBIEN) 5 MG tablet TAKE ONE TABLET BY MOUTH EVERY NIGHT AT BEDTIME AS NEEDED FOR SLEEP 30 tablet 5   buPROPion (WELLBUTRIN XL) 300 MG 24 hr tablet Take 1 tablet (300 mg total) by mouth daily. 30 tablet 5   etodolac (LODINE) 400 MG tablet Take 1 tablet (400 mg total) by mouth 2 (two) times daily. 180 tablet 3   gabapentin (NEURONTIN) 300 MG capsule Take 1 capsule (300 mg total) by mouth 3 (three) times daily. 270 capsule 3   benzonatate (TESSALON) 100 MG capsule Take 1 capsule (100 mg total) by mouth 2 (two) times daily as needed for cough. (Patient not taking: Reported on 06/28/2022) 20 capsule 0   cholecalciferol (VITAMIN D) 1000 UNITS tablet Take 50,000 Units by mouth daily. Take 1 capsules daily (Patient not taking: Reported on 06/28/2022)     darifenacin (ENABLEX) 15 MG 24 hr tablet Take 1 tablet (15 mg total) by mouth daily. 30 tablet 5   ibuprofen (ADVIL,MOTRIN) 800 MG tablet Take 1 tablet (800  mg total)  by mouth every 8 (eight) hours as needed for mild pain. (Patient not taking: Reported on 06/28/2022) 30 tablet 0   ipratropium (ATROVENT) 0.03 % nasal spray Place 2 sprays into both nostrils every 12 (twelve) hours. (Patient not taking: Reported on 06/28/2022) 30 mL 0   methylPREDNISolone (MEDROL DOSEPAK) 4 MG TBPK tablet Take 24 mg on day 1, 20 mg on day 2, 16 mg on day 3, 12 mg on day 4, 8 mg on day 5, 4 mg on day 6. (Patient not taking: Reported on 06/28/2022) 21 tablet 0   olopatadine (PATANOL) 0.1 % ophthalmic solution Place 1 drop into both eyes 2 (two) times daily. (Patient not taking: Reported on 06/28/2022) 5 mL 0   No facility-administered medications prior to visit.     PAST MEDICAL HISTORY: Past Medical History:  Diagnosis Date   Headache    Multiple sclerosis (HCC)    Pseudotumor cerebri    Spina bifida (HCC)      PAST SURGICAL HISTORY: Past Surgical History:  Procedure Laterality Date   BACK SURGERY  2017     FAMILY HISTORY: Family History  Problem Relation Age of Onset   Hypertension Mother    Thyroid disease Mother    Hypertension Father    Thyroid disease Sister    Cancer Maternal Grandmother        brain   Heart disease Maternal Grandfather    Multiple sclerosis Neg Hx      SOCIAL HISTORY: Social History   Socioeconomic History   Marital status: Single    Spouse name: Not on file   Number of children: Not on file   Years of education: Not on file   Highest education level: Not on file  Occupational History   Not on file  Tobacco Use   Smoking status: Never   Smokeless tobacco: Never  Vaping Use   Vaping Use: Never used  Substance and Sexual Activity   Alcohol use: No    Alcohol/week: 0.0 standard drinks of alcohol   Drug use: No   Sexual activity: Yes    Birth control/protection: None  Other Topics Concern   Not on file  Social History Narrative   Not on file   Social Determinants of Health   Financial Resource Strain: Not on file  Food  Insecurity: Not on file  Transportation Needs: Not on file  Physical Activity: Not on file  Stress: Not on file  Social Connections: Not on file  Intimate Partner Violence: Not on file      PHYSICAL EXAM  Vitals:   06/28/22 1442  BP: (!) 143/95  Pulse: (!) 103  Weight: 203 lb (92.1 kg)  Height: 5\' 3"  (1.6 m)    Body mass index is 35.96 kg/m.   Generalized: Well developed, in no acute distress   Neurological examination  Mentation: Alert oriented to time, place, history taking. Follows all commands speech and language fluent Cranial nerve II-XII: Pupils were equal round reactive to light. Extraocular movements were full, visual field were full on confrontational test. Facial sensation and strength were normal. Uvula tongue midline. Head turning and shoulder shrug  were normal and symmetric. Motor: The motor testing reveals 5 over 5 strength of all 4 extremities. Good symmetric motor tone is noted throughout.  Sensory: Sensory testing is intact to soft touch on all 4 extremities. No evidence of extinction is noted.  Coordination: Cerebellar testing reveals good finger-nose-finger and heel-to-shin bilaterally.  Gait and station: Gait is  normal.  Reflexes: Deep tendon reflexes are brisk but symmetric bilaterally.     DIAGNOSTIC DATA (LABS, IMAGING, TESTING) - I reviewed patient records, labs, notes, testing and imaging myself where available.  Lab Results  Component Value Date   WBC 3.6 08/27/2021   HGB 13.0 08/27/2021   HCT 38.3 08/27/2021   MCV 90 08/27/2021   PLT 305 08/27/2021      Component Value Date/Time   NA 140 08/27/2021 0950   K 4.3 08/27/2021 0950   CL 102 08/27/2021 0950   CO2 24 08/27/2021 0950   GLUCOSE 85 08/27/2021 0950   GLUCOSE 109 (H) 05/18/2016 0449   BUN 20 08/27/2021 0950   CREATININE 0.68 08/27/2021 0950   CALCIUM 8.8 08/27/2021 0950   PROT 6.2 08/27/2021 0950   ALBUMIN 4.0 08/27/2021 0950   AST 21 08/27/2021 0950   ALT 23 08/27/2021  0950   ALKPHOS 85 08/27/2021 0950   BILITOT 0.2 08/27/2021 0950   GFRNONAA 84 07/19/2019 0923   GFRAA 97 07/19/2019 0923   No results found for: "CHOL", "HDL", "LDLCALC", "LDLDIRECT", "TRIG", "CHOLHDL" No results found for: "HGBA1C" No results found for: "VITAMINB12" No results found for: "TSH"    ASSESSMENT AND PLAN  47 y.o. year old female  has a past medical history of Headache, Multiple sclerosis ([place]), Pseudotumor cerebri, and Spina bifida ([place]). 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 I will update labs. I have resent darifenacin. She was encouraged to reach out to urology for concerns of kidney stone. She will continue methylphenidate 20mg  1-2 times daily MS fatigue.Contnue gabapentin 300mg  up to three times daily and etodolac 400mg  up to twice daily as needed.  She will continue Wellbutrin daily and Ambien as needed. PDMP reviewed and shows appropriate refills. She was encouraged to stay active and well hydrated. She will return to see [place] in 6 months. She verbalizes understanding and agreement with this plan.     Debbora Presto, MSN, FNP-C 06/28/2022, 3:51 PM  [facility] [address], [place], [place] [address] [phone]

## 2022-06-29 LAB — CBC WITH DIFFERENTIAL/PLATELET
Basophils Absolute: 0 10*3/uL (ref 0.0–0.2)
Basos: 0 %
EOS (ABSOLUTE): 0 10*3/uL (ref 0.0–0.4)
Eos: 0 %
Hematocrit: 41 % (ref 34.0–46.6)
Hemoglobin: 13.8 g/dL (ref 11.1–15.9)
Immature Grans (Abs): 0 10*3/uL (ref 0.0–0.1)
Immature Granulocytes: 1 %
Lymphocytes Absolute: 0.3 10*3/uL — ABNORMAL LOW (ref 0.7–3.1)
Lymphs: 9 %
MCH: 31.1 pg (ref 26.6–33.0)
MCHC: 33.7 g/dL (ref 31.5–35.7)
MCV: 92 fL (ref 79–97)
Monocytes Absolute: 0.5 10*3/uL (ref 0.1–0.9)
Monocytes: 15 %
Neutrophils Absolute: 2.6 10*3/uL (ref 1.4–7.0)
Neutrophils: 75 %
Platelets: 290 10*3/uL (ref 150–450)
RBC: 4.44 x10E6/uL (ref 3.77–5.28)
RDW: 12.7 % (ref 11.7–15.4)
WBC: 3.5 10*3/uL (ref 3.4–10.8)

## 2022-06-29 LAB — COMPREHENSIVE METABOLIC PANEL
ALT: 20 IU/L (ref 0–32)
AST: 20 IU/L (ref 0–40)
Albumin/Globulin Ratio: 1.9 (ref 1.2–2.2)
Albumin: 4 g/dL (ref 3.9–4.9)
Alkaline Phosphatase: 85 IU/L (ref 44–121)
BUN/Creatinine Ratio: 19 (ref 9–23)
BUN: 14 mg/dL (ref 6–24)
Bilirubin Total: 0.3 mg/dL (ref 0.0–1.2)
CO2: 28 mmol/L (ref 20–29)
Calcium: 9 mg/dL (ref 8.7–10.2)
Chloride: 103 mmol/L (ref 96–106)
Creatinine, Ser: 0.73 mg/dL (ref 0.57–1.00)
Globulin, Total: 2.1 g/dL (ref 1.5–4.5)
Glucose: 101 mg/dL — ABNORMAL HIGH (ref 70–99)
Potassium: 4.6 mmol/L (ref 3.5–5.2)
Sodium: 141 mmol/L (ref 134–144)
Total Protein: 6.1 g/dL (ref 6.0–8.5)
eGFR: 101 mL/min/{1.73_m2} (ref >59)

## 2022-06-29 LAB — VITAMIN D 25 HYDROXY (VIT D DEFICIENCY, FRACTURES): Vit D, 25-Hydroxy: 35.5 ng/mL (ref 30.0–100.0)

## 2022-07-05 ENCOUNTER — Other Ambulatory Visit: Payer: Self-pay

## 2022-07-05 DIAGNOSIS — G35 Multiple sclerosis: Secondary | ICD-10-CM

## 2022-07-05 MED ORDER — FINGOLIMOD HCL 0.5 MG PO CAPS: 1.0000 | ORAL_CAPSULE | Freq: Every day | ORAL | 3 refills | Status: DC

## 2022-07-12 ENCOUNTER — Other Ambulatory Visit: Payer: Self-pay | Admitting: Neurology

## 2022-07-12 ENCOUNTER — Encounter: Payer: Self-pay | Admitting: Family Medicine

## 2022-07-12 ENCOUNTER — Encounter: Payer: Self-pay | Admitting: Neurology

## 2022-07-12 MED ORDER — METHYLPHENIDATE HCL 20 MG PO TABS: 20.0000 mg | ORAL_TABLET | Freq: Two times a day (BID) | ORAL | 0 refills | Status: DC

## 2022-07-13 ENCOUNTER — Other Ambulatory Visit: Payer: Self-pay | Admitting: *Deleted

## 2022-07-13 DIAGNOSIS — G35 Multiple sclerosis: Secondary | ICD-10-CM

## 2022-07-13 DIAGNOSIS — G35D Multiple sclerosis, unspecified: Secondary | ICD-10-CM

## 2022-07-13 MED ORDER — FINGOLIMOD HCL 0.5 MG PO CAPS: 1.0000 | ORAL_CAPSULE | Freq: Every day | ORAL | 3 refills | Status: DC

## 2022-09-08 ENCOUNTER — Encounter: Payer: Self-pay | Admitting: Neurology

## 2022-09-08 ENCOUNTER — Telehealth: Payer: 59 | Admitting: Physician Assistant

## 2022-09-08 ENCOUNTER — Other Ambulatory Visit: Payer: Self-pay | Admitting: Neurology

## 2022-09-08 DIAGNOSIS — J019 Acute sinusitis, unspecified: Secondary | ICD-10-CM | POA: Diagnosis not present

## 2022-09-08 DIAGNOSIS — B9689 Other specified bacterial agents as the cause of diseases classified elsewhere: Secondary | ICD-10-CM | POA: Diagnosis not present

## 2022-09-08 MED ORDER — AMOXICILLIN-POT CLAVULANATE 875-125 MG PO TABS: 1.0000 | ORAL_TABLET | Freq: Two times a day (BID) | ORAL | 0 refills | Status: DC

## 2022-09-08 NOTE — Progress Notes (Signed)

## 2022-09-09 MED ORDER — METHYLPHENIDATE HCL 20 MG PO TABS: 20.0000 mg | ORAL_TABLET | Freq: Two times a day (BID) | ORAL | 0 refills | Status: DC

## 2022-09-14 ENCOUNTER — Telehealth: Payer: Self-pay

## 2022-09-14 ENCOUNTER — Other Ambulatory Visit: Payer: Self-pay

## 2022-09-14 MED ORDER — MIRABEGRON ER 50 MG PO TB24: 50.0000 mg | ORAL_TABLET | Freq: Every day | ORAL | 4 refills | Status: DC

## 2022-09-14 NOTE — Telephone Encounter (Signed)
Called pt and asked what pharmacy pt wanted her refill sent to and sent it to her pharmacy.

## 2022-09-30 ENCOUNTER — Encounter: Payer: Self-pay | Admitting: Neurology

## 2022-09-30 DIAGNOSIS — G35 Multiple sclerosis: Secondary | ICD-10-CM

## 2022-09-30 DIAGNOSIS — R200 Anesthesia of skin: Secondary | ICD-10-CM

## 2022-09-30 MED ORDER — PREDNISONE 50 MG PO TABS: ORAL_TABLET | ORAL | 0 refills | Status: DC

## 2022-09-30 NOTE — Telephone Encounter (Signed)
I spoke to her by phone.  She reports that since yesterday she has had numbness in the left greater than right hand and in both legs.  She feels a little off balance.  She has not had a fall.  She does not think she has any infection  We discussed that this could be an exacerbation.  Therefore I would like to treat her with steroids so she recovers more rapidly.  I will send in a high-dose prednisone course.  She should call us back on Monday to let us know how she is doing.  If she progresses further I would want to see her.    Additionally I will order an MRI of the brain and cervical spine.  If there is confirming evidence of breakthrough activity since her previous MRI then we need to consider a different disease modifying therapy.

## 2022-10-06 ENCOUNTER — Telehealth: Payer: Self-pay | Admitting: Neurology

## 2022-10-06 NOTE — Telephone Encounter (Signed)
[facility] NPR Case Number: [id number], [employer] medicaid NPR when second-sent to GI [phone]

## 2022-10-19 ENCOUNTER — Other Ambulatory Visit: Payer: Self-pay

## 2022-10-19 ENCOUNTER — Encounter: Payer: Self-pay | Admitting: Neurology

## 2022-10-20 ENCOUNTER — Other Ambulatory Visit: Payer: Self-pay | Admitting: Neurology

## 2022-10-20 DIAGNOSIS — G35 Multiple sclerosis: Secondary | ICD-10-CM

## 2022-10-20 MED ORDER — FINGOLIMOD HCL 0.5 MG PO CAPS
1.0000 | ORAL_CAPSULE | Freq: Every day | ORAL | 3 refills | Status: DC
Start: 2022-10-20 — End: 2023-10-18

## 2022-10-21 ENCOUNTER — Other Ambulatory Visit: Payer: Self-pay

## 2022-10-28 ENCOUNTER — Encounter: Payer: Self-pay | Admitting: Family

## 2022-10-28 ENCOUNTER — Ambulatory Visit (HOSPITAL_BASED_OUTPATIENT_CLINIC_OR_DEPARTMENT_OTHER)
Admission: RE | Admit: 2022-10-28 | Discharge: 2022-10-28 | Disposition: A | Discharge status: Home / Self Care | Payer: 59 | Source: Ambulatory Visit | Attending: Family | Admitting: Family

## 2022-10-28 ENCOUNTER — Ambulatory Visit (INDEPENDENT_AMBULATORY_CARE_PROVIDER_SITE_OTHER): Payer: 59 | Admitting: Family

## 2022-10-28 VITALS — BP 122/82 | HR 105 | Ht 63.0 in | Wt 197.0 lb

## 2022-10-28 DIAGNOSIS — Z13220 Encounter for screening for lipoid disorders: Secondary | ICD-10-CM | POA: Diagnosis not present

## 2022-10-28 DIAGNOSIS — R0602 Shortness of breath: Secondary | ICD-10-CM

## 2022-10-28 DIAGNOSIS — M79642 Pain in left hand: Secondary | ICD-10-CM | POA: Diagnosis present

## 2022-10-28 DIAGNOSIS — R32 Unspecified urinary incontinence: Secondary | ICD-10-CM

## 2022-10-28 DIAGNOSIS — Z1231 Encounter for screening mammogram for malignant neoplasm of breast: Secondary | ICD-10-CM

## 2022-10-28 DIAGNOSIS — M549 Dorsalgia, unspecified: Secondary | ICD-10-CM

## 2022-10-28 DIAGNOSIS — Z1211 Encounter for screening for malignant neoplasm of colon: Secondary | ICD-10-CM

## 2022-10-28 DIAGNOSIS — G8929 Other chronic pain: Secondary | ICD-10-CM

## 2022-10-28 DIAGNOSIS — Z124 Encounter for screening for malignant neoplasm of cervix: Secondary | ICD-10-CM

## 2022-10-28 MED ORDER — ALBUTEROL SULFATE HFA 108 (90 BASE) MCG/ACT IN AERS: 2.0000 | INHALATION_SPRAY | Freq: Four times a day (QID) | RESPIRATORY_TRACT | 1 refills | Status: AC | PRN

## 2022-10-28 NOTE — Patient Instructions (Signed)
Regarding your labs, you will go to [address] [place], [place] 7:30 am-5:30 pm;

## 2022-10-28 NOTE — Progress Notes (Signed)
Mary Beck is a 47 y.o. 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   High risk medication use 03/11/2016   Neurogenic bladder 03/04/2016   Urinary incontinence without sensory awareness 03/04/2016   Major depressive disorder, recurrent episode, moderate (HCC) 03/02/2016   Synovial cyst of lumbar facet joint 10/21/2015   Spina bifida occulta 10/21/2015   Back pain, chronic 09/19/2015   Insomnia 09/19/2015   Allergic rhinitis, seasonal 09/19/2015   Right-sided low back pain with right-sided sciatica 01/15/2015   Leg pain 01/15/2015   Increased frequency of urination 08/07/2014   Other fatigue 08/07/2014   Depression 08/07/2014   Fatigue 08/07/2014   MS (multiple sclerosis) (HCC) 09/17/2011   Multiple sclerosis (HCC) 09/17/2011    Current Outpatient Medications  Medication Sig Dispense Refill   buPROPion (WELLBUTRIN XL) 300 MG 24 hr tablet Take 1 tablet (300 mg total) by mouth daily. 90 tablet 3   etodolac (LODINE) 200 MG capsule Take 2 capsules (400 mg total) by mouth 2 (two) times daily. 180 capsule 3   Fingolimod HCl (GILENYA) 0.5 MG CAPS Take 1 capsule (0.5 mg total) by mouth daily. 90 capsule 3   gabapentin (NEURONTIN) 300 MG capsule Take 1 capsule (300 mg total) by mouth 3 (three) times daily. 270 capsule 3   methylphenidate (RITALIN) 20 MG tablet Take 1 tablet (20 mg total) by mouth 2 (two) times daily. 60 tablet 0   mirabegron ER (MYRBETRIQ) 50 MG TB24 tablet Take 1 tablet (50 mg total) by mouth daily. 90 tablet 4   zolpidem (AMBIEN) 5 MG tablet TAKE ONE TABLET BY  MOUTH EVERY NIGHT AT BEDTIME AS NEEDED FOR SLEEP 30 tablet 5   albuterol (VENTOLIN HFA) 108 (90 Base) MCG/ACT inhaler Inhale 2 puffs into the lungs every 6 (six) hours as needed for wheezing or shortness of breath. 8 g 1   No current facility-administered medications for this visit.    Allergies: Patient has no known allergies.  Past Medical History:  Diagnosis Date   Headache    Multiple sclerosis (HCC)    Pseudotumor cerebri    Spina bifida [facility])     Past Surgical History:  Procedure Laterality Date   BACK SURGERY  2017    Family History  Problem Relation Age of Onset   Hypertension Mother    Thyroid disease Mother    Hypertension Father    Thyroid disease Sister    Cancer Maternal Grandmother        brain   Heart disease Maternal Grandfather    Multiple sclerosis Neg Hx     Social History   Tobacco Use   Smoking status: Never   Smokeless tobacco: Never  Substance Use Topics   Alcohol use: No    Alcohol/week: 0.0 standard drinks of alcohol    Subjective:   Presents today as a new patient; does have history of MS- working with Dr. Epimenio Foot for management of symptoms; concerned about worsening urinary incontinence- currently trying to get Myrbetriq approved; concerned about lumbar source of symptoms due to past medical history;  Concerned about sensation of shortness of breath- does not feel  that completely empty her lungs; has used albuterol inhaler in the past; symptoms present x 8 years;   LMP- now  Would like to get her lipids updated if possible;  Needs to get established with GYN; overdue for mammogram and colon cancer screening;    Objective:  Vitals:   10/28/22 0943  BP: 122/82  Pulse: (!) 105  SpO2: 98%  Weight: 197 lb (89.4 kg)  Height: 5\' 3"  (1.6 m)    General: Well developed, well nourished, in no acute distress  Skin : Warm and dry.  Head: Normocephalic and atraumatic  Lungs: Respirations unlabored; clear to auscultation bilaterally without  wheeze, rales, rhonchi  CVS exam: normal rate and regular rhythm.  Abdomen: Soft; nontender; nondistended; normoactive bowel sounds; no masses or hepatosplenomegaly  Musculoskeletal: No deformities; no active joint inflammation  Extremities: No edema, cyanosis, clubbing  Vessels: Symmetric bilaterally  Neurologic: Alert and oriented; speech intact; face symmetrical; moves all extremities well; CNII-XII intact without focal deficit   Assessment:  1. Urinary incontinence, unspecified type   2. Chronic back pain, unspecified back location, unspecified back pain laterality   3. Shortness of breath   4. Visit for screening mammogram   5. Cervical cancer screening   6. Colon cancer screening   7. Lipid screening   8. Left hand pain     Plan:  Due to prior history of lumbar issues as source of urinary issues, agree that lumbar MRI is reasonable; she will also follow up with neurology about Myrbetriq Rx; 3.   Refer to allergy per patient request; refill updated; 4.   Order updated; 5.   Order updated to GYN per patient request; 6.   Cologuard ordered; 7.   Patient will go to [facility] lab at later date at her convenience- follow up to be determined; 8.   Update left hand X-ray; follow up to be determined;   No follow-ups on file.  Orders Placed This Encounter  Procedures   MR Lumbar Spine Wo Contrast    Standing Status:   Future    Standing Expiration Date:   10/28/2023    Order Specific Question:   What is the patient's sedation requirement?    Answer:   No Sedation    Order Specific Question:   Does the patient have a pacemaker or implanted devices?    Answer:   No    Order Specific Question:   Preferred imaging location?    Answer:   [facility] (table limit-550lbs)   MM Digital Screening    Standing Status:   Future    Standing Expiration Date:   10/28/2023    Order Specific Question:   Reason for Exam (SYMPTOM  OR DIAGNOSIS REQUIRED)    Answer:   screening mammogram    Order  Specific Question:   Is the patient pregnant?    Answer:   No    Order Specific Question:   Preferred imaging location?    Answer:   [facility]   DG Hand Complete Left    Standing Status:   Future    Number of Occurrences:   1    Standing Expiration Date:   10/28/2023    Order Specific Question:   Reason for Exam (SYMPTOM  OR DIAGNOSIS REQUIRED)    Answer:   left hand pain    Order Specific Question:   Is patient pregnant?    Answer:   No    Order Specific Question:   Preferred imaging location?  Answer:   [facility]   Cologuard   Lipid panel    Standing Status:   Future    Standing Expiration Date:   10/28/2023   Ambulatory referral to Allergy    Referral Priority:   Routine    Referral Type:   Allergy Testing    Referral Reason:   Specialty Services Required    Requested Specialty:   Allergy    Number of Visits Requested:   1   Ambulatory referral to Obstetrics / Gynecology    Referral Priority:   Routine    Referral Type:   Consultation    Referral Reason:   Specialty Services Required    Referred to Provider:   Sherian Rein, MD    Requested Specialty:   Obstetrics and Gynecology    Number of Visits Requested:   1    Requested Prescriptions   Signed Prescriptions Disp Refills   albuterol (VENTOLIN HFA) 108 (90 Base) MCG/ACT inhaler 8 g 1    Sig: Inhale 2 puffs into the lungs every 6 (six) hours as needed for wheezing or shortness of breath.

## 2022-10-29 ENCOUNTER — Telehealth: Payer: Self-pay | Admitting: Family

## 2022-10-29 ENCOUNTER — Other Ambulatory Visit: Payer: Self-pay | Admitting: Family

## 2022-10-29 DIAGNOSIS — Z124 Encounter for screening for malignant neoplasm of cervix: Secondary | ICD-10-CM

## 2022-10-29 NOTE — Telephone Encounter (Signed)
Ingrid from [facility] called to advise they are not accepting new patients with medicaid so she will need to be referred to another office

## 2022-10-29 NOTE — Telephone Encounter (Signed)
Spoke with pt, pt is aware and expressed understanding.  

## 2022-11-01 ENCOUNTER — Encounter: Payer: Self-pay | Admitting: Neurology

## 2022-11-03 ENCOUNTER — Other Ambulatory Visit (HOSPITAL_COMMUNITY): Payer: Self-pay

## 2022-11-03 ENCOUNTER — Telehealth: Payer: Self-pay | Admitting: Pharmacy Technician

## 2022-11-03 NOTE — Telephone Encounter (Signed)
"  Approved. This drug has been approved. Approved quantity: 30 tablets per 30 day(s). You may fill up to a 34 day supply at a retail pharmacy. You may fill up to a 90 day supply for maintenance drugs, please refer to the formulary for details. Please call the pharmacy to process your prescription claim."

## 2022-11-03 NOTE — Telephone Encounter (Signed)
Patient Advocate Encounter   Received notification that prior authorization for Mirabegron ER 50MG  er tablets is required.   PA submitted on 11/03/2022 Key [id number] Insurance [employer] of [facility] Electronic Prior Authorization Request Form Status is pending

## 2022-11-03 NOTE — Telephone Encounter (Signed)
Patient Advocate Encounter  Prior Authorization for MIRABEGRON 50MG  has been approved with [facility].    PA# [id number] Effective dates: 4.25.24 through 4.25.25  Per [facility] test claim, copay for 90 days supply is $4

## 2022-11-06 ENCOUNTER — Other Ambulatory Visit (HOSPITAL_COMMUNITY): Payer: Self-pay

## 2022-11-06 ENCOUNTER — Telehealth: Payer: Self-pay

## 2022-11-06 NOTE — Telephone Encounter (Signed)
I received a PA request via CMM for this PT- The medication is Etodolac 200 MG Capsules-Can you verify what the diagnosis code is associated with this medication? Thanks!

## 2022-11-08 NOTE — Telephone Encounter (Signed)
Pharmacy Patient Advocate Encounter   Received notification from [facility] that prior authorization for Etodolac 200MG  capsules is required/requested.   PA submitted on 11/08/2022 to (ins) [facility] [place] via CoverMyMeds Key or (Medicaid) confirmation # [id number] Status is pending

## 2022-11-08 NOTE — Telephone Encounter (Signed)
Received fax from [facility] that PA approved 10/29/22-10/29/23. Ticket# [id number]

## 2022-11-08 NOTE — Telephone Encounter (Signed)
Dr. Epimenio Foot- what dx code do you want to use for etodolac?

## 2022-12-06 ENCOUNTER — Other Ambulatory Visit: Payer: Self-pay | Admitting: Neurology

## 2022-12-07 MED ORDER — METHYLPHENIDATE HCL 20 MG PO TABS: 20.0000 mg | ORAL_TABLET | Freq: Two times a day (BID) | ORAL | 0 refills | Status: DC

## 2022-12-07 NOTE — Telephone Encounter (Signed)
Last seen on 06/28/22 Follow up scheduled on 12/28/22 Last filled on 09/09/22 #60 tablets (30 day supply) Rx pending to be signed

## 2022-12-08 ENCOUNTER — Encounter: Payer: Self-pay | Admitting: Neurology

## 2022-12-10 ENCOUNTER — Ambulatory Visit
Admission: RE | Admit: 2022-12-10 | Discharge: 2022-12-10 | Disposition: A | Discharge status: Home / Self Care | Payer: 59 | Source: Ambulatory Visit | Attending: Neurology | Admitting: Neurology

## 2022-12-10 DIAGNOSIS — G35 Multiple sclerosis: Secondary | ICD-10-CM | POA: Diagnosis not present

## 2022-12-10 DIAGNOSIS — R200 Anesthesia of skin: Secondary | ICD-10-CM

## 2022-12-10 MED ORDER — GADOPICLENOL 0.5 MMOL/ML IV SOLN
9.0000 mL | Freq: Once | INTRAVENOUS | Status: AC | PRN
  Administered 2022-12-10: 9 mL via INTRAVENOUS

## 2022-12-11 LAB — COLOGUARD: COLOGUARD: NEGATIVE

## 2022-12-15 ENCOUNTER — Encounter: Payer: Self-pay | Admitting: Family Medicine

## 2022-12-17 ENCOUNTER — Ambulatory Visit
Admission: RE | Admit: 2022-12-17 | Discharge: 2022-12-17 | Disposition: A | Discharge status: Home / Self Care | Payer: 59 | Source: Ambulatory Visit | Attending: Family | Admitting: Family

## 2022-12-17 DIAGNOSIS — R32 Unspecified urinary incontinence: Secondary | ICD-10-CM

## 2022-12-17 DIAGNOSIS — G8929 Other chronic pain: Secondary | ICD-10-CM

## 2022-12-28 ENCOUNTER — Ambulatory Visit: Payer: Medicaid Other | Admitting: Neurology

## 2023-01-17 ENCOUNTER — Ambulatory Visit (INDEPENDENT_AMBULATORY_CARE_PROVIDER_SITE_OTHER): Payer: 59 | Admitting: Neurology

## 2023-01-17 ENCOUNTER — Encounter: Payer: Self-pay | Admitting: Neurology

## 2023-01-17 VITALS — BP 144/96 | HR 85 | Ht 63.0 in | Wt 201.5 lb

## 2023-01-17 DIAGNOSIS — G35 Multiple sclerosis: Secondary | ICD-10-CM | POA: Diagnosis not present

## 2023-01-17 DIAGNOSIS — M5416 Radiculopathy, lumbar region: Secondary | ICD-10-CM | POA: Diagnosis not present

## 2023-01-17 DIAGNOSIS — M4317 Spondylolisthesis, lumbosacral region: Secondary | ICD-10-CM

## 2023-01-17 DIAGNOSIS — Z79899 Other long term (current) drug therapy: Secondary | ICD-10-CM

## 2023-01-17 DIAGNOSIS — Q057 Lumbar spina bifida without hydrocephalus: Secondary | ICD-10-CM

## 2023-01-17 DIAGNOSIS — G8929 Other chronic pain: Secondary | ICD-10-CM

## 2023-01-17 DIAGNOSIS — M7138 Other bursal cyst, other site: Secondary | ICD-10-CM

## 2023-01-17 DIAGNOSIS — M549 Dorsalgia, unspecified: Secondary | ICD-10-CM

## 2023-01-17 MED ORDER — ETODOLAC 200 MG PO CAPS: 400.0000 mg | ORAL_CAPSULE | Freq: Two times a day (BID) | ORAL | 3 refills | Status: DC

## 2023-01-17 MED ORDER — METHYLPHENIDATE HCL 20 MG PO TABS: 20.0000 mg | ORAL_TABLET | Freq: Two times a day (BID) | ORAL | 0 refills | Status: DC

## 2023-01-17 MED ORDER — DULOXETINE HCL 60 MG PO CPEP
60.0000 mg | ORAL_CAPSULE | Freq: Every day | ORAL | 3 refills | Status: DC
Start: 2023-01-17 — End: 2023-12-21

## 2023-01-17 NOTE — Progress Notes (Signed)
[facility]  PATIENT: Mary Beck DOB: 11-28-1975   _________________________________   HISTORICAL  CHIEF COMPLAINT:  Chief Complaint  Patient presents with   Room 10    Pt is here Alone. Pt states that she has really bad nerve pain in her back on her left side. Pt states that her incontinence issue has gotten worse. Pt states that she has some swelling in her right foot that feels tight. Pt states that she has some numbness in the bottom of her feet.     HISTORY OF PRESENT ILLNESS:  Dimitri Hinderman is a 47 y.o. woman with relapsing remitting MS diagnosed in 2010.    Update 01/17/2023: She is tolerating Gilenya well for her RRMS.   She has no exacerbations but feels slightly weaker going upstairs..      She is noting more urinary incontinence, worse the week before her period.    She is currently on Myrbetriq.  Oxybutynin and Vesicare had not helped much either.     She is also noting more pain.  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   She feels left leg weakness and pain initially.  Current pain is left > right (was worse on right).      I personally reviewed the recent MRI from 12/17/2022.  At L5-S1, there is grade 1 anterolisthesis that has occurred since 2019 and severe facet hypertrophy.  There is some residual synovial cyst on the right.  There is severe left greater than right foraminal narrowing with probable left L5 nerve root compression and there could also be impingement of the S1 nerve roots.  At L4-L5, there are degenerative changes that have progressed since 2019 this causes severe left foraminal narrowing with potential for left L4 nerve root compression.  Facet joints are also severely degenerated.  Gait is slightly wide.  Balance is reduced and she prefers to use the bannister on stairs.   Niel Hummer a couple falls over last year.    Strength is a little reduced in legs, most noted in the mornings.  She has some  spasticity in legs.  Legs are sometimes sore.     Vision is doing well.   She has a recent eye exam and was told she had no MS related issues.      Bladder function is poor    Myrbetriq is now covered and it helps her a lot and incontinence is now rarre.she does not have incontinence.  She is better on Myrbetriq 50 mg than solifenacin (vesicare) and better than oxybutynin.   Vision is fine.  She has fatigue daily which is helped by Adderall. It also helps mild cognitive issues - processing, word finding.  SHe had some difficulty with her last job.   Mood is ok with Wellbutrin but she still has mild depression.    Sleep is usually ok.   She takes zolpidem rarely for insomnia.      She nites edema in left foot.    She take Vit D supplement daily.       MS HIstory:   She was diagnosed in 2010 after presenting with numbness in the feet up to her knees. She had an MRI of the brain that was abnormal. This was followed by a lumbar puncture in the CSF was also abnormal. She was diagnosed with MS and was placed on Copaxone. She had an exacerbation and was switched to Betaseron. Due to needle fatigue she was  switched to Gilenya about 2 years ago. Since being on Gilenya her MS has been stable. She denies any exacerbations. She tolerates Gilenya well. She had an MRI of the brain early 2015 that was unchanged compared to her previous MRI.   MRI May 2017 shows a new large chronic focus in the left frontal lobe not present in 2015. MRI of the cervical spine shows 3 T2 hyperintense foci  IMAGING MRI of the lumbar spine 12/17/2022 shows MRI from 12/17/2022.  At L5-S1, there is grade 1 anterolisthesis that has occurred since 2019 and severe facet hypertrophy.  There is some residual synovial cyst on the right.  There is severe left greater than right foraminal narrowing with probable left L5 nerve root compression and there could also be impingement of the S1 nerve roots.  At L4-L5, there are degenerative changes  that have progressed since 2019 this causes severe left foraminal narrowing with potential for left L4 nerve root compression.  Facet joints are also severely degenerated.  MRI of the brain and cervical spine 12/10/2022 are unchanged compared to previous MRIs from 06/11/2020.  Typical MS changes in the brain.  Foci at C4-C5, C5, C6 and C7 in the cervical spinal cord.  Mild spinal stenosis at C5-C6 and C6-C7 but no definite nerve root compression.  MRI of the brain, cervical spine thoracic spine 06/16/2020 and 06/11/2020 were unchanged compared to the previous studies from 2019.  MRI brain 10/10/2017: 1.   Multiple T2/FLAIR hyperintense foci in the periventricular, juxtacortical and deep white matter of both hemispheres and in the right cerebellar hemisphere in a pattern and configuration consistent with chronic demyelinating plaque.  None of the foci appears to be acute.  When compared to the MRI dated  11/10/2015, there are no new lesions and 1 focus in the left frontal lobe appears much smaller on the current study.   2.    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 3.   There are no acute findings  MRI cervical 10/10/2017: IMPRESSION: This MRI of the cervical spine with and without contrast shows the following: 1.    There are several foci within the spinal cord adjacent to C2, C4-C5, C5 and C7 consistent with chronic demyelinating plaque associated with multiple sclerosis.  All these foci were noted on the 11/10/2015 MRI and they do not enhance. 2.    At C3-C4 there is a small left paramedian disc bulge not present on the 2017 MRI.  There is no nerve root compression. 3.    At C5-C6 there is stable disc bulging and mild bilateral foraminal narrowing but no nerve root compression. 4.    At C6-C7, there is stable disc protrusion and mild to moderate foraminal narrowing and mild spinal stenosis.   There is no nerve root compression. 5.    There is a normal enhancement pattern and there are no acute findings.Marland Kitchen        MRI thoracic 10/10/2017: IMPRESSION: This MRI of the thoracic spine without contrast shows the following: 1.    Anterior to central focus within the spinal cord adjacent to T8 and central focus within the spinal cord adjacent to T11.  These are nonspecific but consistent with transverse myelitis or demyelination.. 2.    Small syrinx from T5-T6 to T7-T8    MRI lumbar 10/10/2017: IMPRESSION: This MRI of the lumbar spine with and without contrast shows the following: 1.  Posterior decompression from L3-S1. 2.    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  This is unchanged when compared to the previous MRI. 3.    At L4-L5, there is left greater than right facet hypertrophy causing moderate left foraminal narrowing.  There is no definite nerve root compression though there is some encroachment upon the left L4 nerve root.  This appears stable. 4.    At L5-S1, there is severe right greater than left facet hypertrophy and a right synovial cyst causing severe right lateral recess stenosis.  This could lead to right S1 nerve root compression.  Changes appear stable compared to the 08/31/2016 MRI. 5.    There is mild epidural fibrosis to the right at L4-L5 and L5-S1.    MRI Brain 11/10/15 IMPRESSION:  This MRI of the brain with and without contrast shows the following: 1.    Scattered T2/FLAIR hyperintense foci in the hemispheres and right cerebellum in a pattern and configuration consistent with chronic demyelinating plaque associated with multiple sclerosis. None of the foci appears to be acute. However, when compared to the MRI dated 11/07/2013, one left frontal subcortical focus has occurred during the interim. 2,    There are no acute findings.  MRi Cervical 11/10/15 IMPRESSION:  This MRI of the cervical spine with and  without contrast shows the following: 1.   There are three T2 hyperintense foci within the spinal cord as detailed above consistent with chronic demyelinating plaque associated with multiple sclerosis. None of the foci enhances after contrast administration. 2.    Mild degenerative changes at C5-C6 and C6-C7 that did not lead to any nerve root impingement. 3.    There is a normal enhancement pattern.  2017   (pre-surgical) 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 2. Posterior element fusion anomaly/spina bifida at L3 and below. 3. Advanced facet disease in the lower lumbar spine. 4. 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  Other data: She saw Orie Fisherman, Psychologist for neurocognitive evaluation in 12/18/2015: Overall, Ms. Maston's neurocognitive profile is consistent with a mild neurocognitive disorder (i.e., MCI), predominantly dysexecutive, with the combination of her multiple sclerosis and mood symptomology as likely etiologies.      REVIEW OF SYSTEMS: Constitutional: No fevers, chills, sweats, or change in appetite.   She reports fatigue Eyes: No visual changes, double vision, .  However, mild eye pain Ear, nose and throat: No hearing loss, ear pain, nasal congestion, sore throat Cardiovascular: No chest pain, palpitations Respiratory:  No shortness of breath at rest or with exertion.   No wheezes GastrointestinaI: No nausea, vomiting, diarrhea, abdominal pain, fecal incontinence Genitourinary:  No dysuria or urinary retention.  She notes frequency and nocturia. Musculoskeletal:  No neck pain.  Notes back pain Integumentary: No rash, pruritus, skin lesions Neurological: as above.  Also, occasional Headache Psychiatric: Reports mild depression but little anxiety Endocrine: No palpitations,  diaphoresis, change in appetite, change in weigh or increased thirst Hematologic/Lymphatic:  No anemia, purpura, petechiae. Allergic/Immunologic: No itchy/runny eyes, nasal congestion, recent allergic reactions, rashes  ALLERGIES: No Known Allergies  HOME MEDICATIONS:  Current Outpatient Medications:    albuterol (VENTOLIN HFA) 108 (90 Base) MCG/ACT inhaler, Inhale 2 puffs into the lungs every 6 (six) hours as needed  for wheezing or shortness of breath., Disp: 8 g, Rfl: 1   buPROPion (WELLBUTRIN XL) 300 MG 24 hr tablet, Take 1 tablet (300 mg total) by mouth daily., Disp: 90 tablet, Rfl: 3   DULoxetine (CYMBALTA) 60 MG capsule, Take 1 capsule (60 mg total) by mouth daily., Disp: 90 capsule, Rfl: 3   Fingolimod HCl (GILENYA) 0.5 MG CAPS, Take 1 capsule (0.5 mg total) by mouth daily., Disp: 90 capsule, Rfl: 3   gabapentin (NEURONTIN) 300 MG capsule, Take 1 capsule (300 mg total) by mouth 3 (three) times daily., Disp: 270 capsule, Rfl: 3   mirabegron ER (MYRBETRIQ) 50 MG TB24 tablet, Take 1 tablet (50 mg total) by mouth daily., Disp: 90 tablet, Rfl: 4   zolpidem (AMBIEN) 5 MG tablet, TAKE ONE TABLET BY MOUTH EVERY NIGHT AT BEDTIME AS NEEDED FOR SLEEP, Disp: 30 tablet, Rfl: 5   etodolac (LODINE) 200 MG capsule, Take 2 capsules (400 mg total) by mouth 2 (two) times daily., Disp: 180 capsule, Rfl: 3   methylphenidate (RITALIN) 20 MG tablet, Take 1 tablet (20 mg total) by mouth 2 (two) times daily., Disp: 60 tablet, Rfl: 0  PAST MEDICAL HISTORY: Past Medical History:  Diagnosis Date   Headache    Multiple sclerosis (HCC)    Pseudotumor cerebri    Spina bifida (HCC)     PAST SURGICAL HISTORY: Past Surgical History:  Procedure Laterality Date   BACK SURGERY  2017    FAMILY HISTORY: Family History  Problem Relation Age of Onset   Hypertension Mother    Thyroid disease Mother    Hypertension Father    Thyroid disease Sister    Cancer Maternal Grandmother        brain   Heart disease  Maternal Grandfather    Multiple sclerosis Neg Hx     SOCIAL HISTORY:  Social History   Socioeconomic History   Marital status: Single    Spouse name: Not on file   Number of children: Not on file   Years of education: Not on file   Highest education level: Not on file  Occupational History   Not on file  Tobacco Use   Smoking status: Never   Smokeless tobacco: Never  Vaping Use   Vaping status: Never Used  Substance and Sexual Activity   Alcohol use: No    Alcohol/week: 0.0 standard drinks of alcohol   Drug use: No   Sexual activity: Yes    Birth control/protection: None  Other Topics Concern   Not on file  Social History Narrative   Right Handed   Tea Occasionally    Social Determinants of Health   Financial Resource Strain: Not on file  Food Insecurity: Not on file  Transportation Needs: Not on file  Physical Activity: Not on file  Stress: Not on file  Social Connections: Not on file  Intimate Partner Violence: Unknown (09/21/2021)   Received from [facility]   HITS    Physically Hurt: Not on file    Insult or Talk Down To: Not on file    Threaten Physical Harm: Not on file    Scream or Curse: Not on file     PHYSICAL EXAM  Vitals:   01/17/23 1406  BP: (!) 144/96  Pulse: 85  Weight: 201 lb 8 oz (91.4 kg)  Height: 5\' 3"  (1.6 m)    Body mass index is 35.69 kg/m.   General: The patient is well-developed and well-nourished and in no acute distress  Neurologic Exam  Mental status: The patient is alert and oriented x 3 at the time of the examination. The patient has apparent normal recent and remote memory, with an apparently normal attention span and concentration ability.   Speech is normal.  Cranial nerves: Extraocular movements are full.     Facial symmetry is present. There is good facial sensation to soft touch bilaterally.Facial strength is normal.  Trapezius and sternocleidomastoid strength is normal. No dysarthria is noted.    No obvious  hearing deficits are noted.  Motor:  Muscle bulk is normal.   Tone is normal. Strength is  5 / 5 in all 4 extremities . Can stand on toes and heesl  Sensory: She has mildly reduced touch and vibration sensation in the right hand compared to the left hand. Leg sensation was normal.   Coordination: Cerebellar testing reveals good finger-nose-finger and heel-to-shin bilaterally.  Gait and station: Station is normal.   Her gait is mildly wide.  The tandem gait is wide.  Romberg is negative.   Reflexes: Deep tendon reflexes are normal and symmetric in the arms.  Deep tendon reflexes were increased at the knees.  There is a crossed adductor response on the left.  Asymmetry at the ankles, reduced right reflex  ASSESSMENT AND PLAN  1. Multiple sclerosis (HCC)   2. Spondylolisthesis at L5-S1 level   3. Lumbar radiculopathy   4. Spina bifida of lumbosacral region without hydrocephalus (HCC)   5. Synovial cyst of lumbar facet joint   6. Chronic back pain, unspecified back location, unspecified back pain laterality   7. High risk medication use       1.   For MS, continue Gilenya.   We will check a CBC and CMP with differential today.     2.   Continue Myrbetriq for bladder dysfunction.  It has helped her more than any other medication 3.   Referral to neurosurgery for progressive changes at L4-L5 and L5-S1, now with anterolisthesis.  She has surgery for tethered cord and degenerative change in 2017. 4.    Continue Ritalin for fatigue/ADD.  Duloxetine and etodolac for pain 5.   Compression stockings for edema.  If still a problem, advised to discuss further with PCP 6.    RTC  6 months or sooner if new or worsening neurologic problems.   Neomi Laidler A. Epimenio Foot, MD, PhD 01/17/2023, 5:48 PM Certified in Neurology, Clinical Neurophysiology, Sleep Medicine, Pain Medicine and Neuroimaging  [facility] [address] [place], [place] [address] [phone]  -

## 2023-01-18 ENCOUNTER — Telehealth: Payer: Self-pay | Admitting: Neurology

## 2023-01-18 NOTE — Telephone Encounter (Signed)
Referral sent to [facility] [phone] 

## 2023-04-25 ENCOUNTER — Other Ambulatory Visit: Payer: Self-pay | Admitting: Neurology

## 2023-04-25 MED ORDER — METHYLPHENIDATE HCL 20 MG PO TABS: 20.0000 mg | ORAL_TABLET | Freq: Two times a day (BID) | ORAL | 0 refills | Status: DC

## 2023-04-25 NOTE — Telephone Encounter (Signed)
Last seen 01/17/23 Follow up scheduled on 07/21/23 Last filled on 01/17/23 #60 tablets (30 day supply) Rx pending to be signed

## 2023-04-28 ENCOUNTER — Other Ambulatory Visit: Payer: Self-pay | Admitting: *Deleted

## 2023-04-28 MED ORDER — GABAPENTIN 300 MG PO CAPS: 300.0000 mg | ORAL_CAPSULE | Freq: Three times a day (TID) | ORAL | 1 refills | Status: DC

## 2023-04-28 NOTE — Telephone Encounter (Signed)
Last seen on 01/17/23 Follow up scheduled on 07/21/23

## 2023-06-18 ENCOUNTER — Other Ambulatory Visit: Payer: Self-pay | Admitting: Neurology

## 2023-06-20 MED ORDER — METHYLPHENIDATE HCL 20 MG PO TABS: 20.0000 mg | ORAL_TABLET | Freq: Two times a day (BID) | ORAL | 0 refills | Status: DC

## 2023-06-20 NOTE — Telephone Encounter (Signed)
Last seen on 01/17/23 Follow up scheduled on 07/21/23 Last filled on 04/25/23 #60 tablets (30 day supply) Rx pending to be signed

## 2023-07-19 ENCOUNTER — Other Ambulatory Visit: Payer: Self-pay

## 2023-07-19 MED ORDER — BUPROPION HCL ER (XL) 300 MG PO TB24: 300.0000 mg | ORAL_TABLET | Freq: Every day | ORAL | 0 refills | Status: DC

## 2023-07-21 ENCOUNTER — Ambulatory Visit: Payer: 59 | Admitting: Neurology

## 2023-08-12 ENCOUNTER — Other Ambulatory Visit: Payer: Self-pay | Admitting: Neurology

## 2023-08-16 MED ORDER — METHYLPHENIDATE HCL 20 MG PO TABS: 20.0000 mg | ORAL_TABLET | Freq: Two times a day (BID) | ORAL | 0 refills | Status: DC

## 2023-08-16 NOTE — Telephone Encounter (Signed)
 Last seen 01/17/23 and next f/u 02/13/24. Last refilled 06/20/23 #60.

## 2023-09-15 ENCOUNTER — Other Ambulatory Visit: Payer: Self-pay | Admitting: Neurology

## 2023-09-16 NOTE — Telephone Encounter (Signed)
 Last seen on 01/17/23 Follow up scheduled on 02/13/24

## 2023-10-11 ENCOUNTER — Other Ambulatory Visit: Payer: Self-pay | Admitting: *Deleted

## 2023-10-11 MED ORDER — BUPROPION HCL ER (XL) 300 MG PO TB24: 300.0000 mg | ORAL_TABLET | Freq: Every day | ORAL | 0 refills | Status: DC

## 2023-10-13 ENCOUNTER — Other Ambulatory Visit: Payer: Self-pay | Admitting: Neurology

## 2023-10-13 MED ORDER — METHYLPHENIDATE HCL 20 MG PO TABS: 20.0000 mg | ORAL_TABLET | Freq: Two times a day (BID) | ORAL | 0 refills | Status: DC

## 2023-10-13 NOTE — Telephone Encounter (Signed)
 Last seen on 01/17/23 Follow up scheduled on 02/13/24   Dispensed Days Supply Quantity Provider Pharmacy  METHYLPHENIDATE  HYDROCHLORIDE  20 MG TABS 08/16/2023 30 60 tablet Sater, Sherida Dimmer, MD [facility] [phone] Jacklin Mascot.      Rx pending to be signed

## 2023-10-14 ENCOUNTER — Other Ambulatory Visit: Payer: Self-pay | Admitting: Family

## 2023-10-14 DIAGNOSIS — Z1231 Encounter for screening mammogram for malignant neoplasm of breast: Secondary | ICD-10-CM

## 2023-10-18 ENCOUNTER — Other Ambulatory Visit: Payer: Self-pay | Admitting: Neurology

## 2023-10-18 ENCOUNTER — Other Ambulatory Visit (HOSPITAL_COMMUNITY): Payer: Self-pay

## 2023-10-18 DIAGNOSIS — G35 Multiple sclerosis: Secondary | ICD-10-CM

## 2023-10-18 NOTE — Telephone Encounter (Signed)
 Last seen on 01/17/23 Follow up scheduled on 02/13/24

## 2023-10-21 ENCOUNTER — Ambulatory Visit
Admission: RE | Admit: 2023-10-21 | Discharge: 2023-10-21 | Disposition: A | Discharge status: Home / Self Care | Source: Ambulatory Visit

## 2023-10-21 DIAGNOSIS — Z1231 Encounter for screening mammogram for malignant neoplasm of breast: Secondary | ICD-10-CM

## 2023-10-26 ENCOUNTER — Other Ambulatory Visit: Payer: Self-pay | Admitting: Family

## 2023-10-26 DIAGNOSIS — R928 Other abnormal and inconclusive findings on diagnostic imaging of breast: Secondary | ICD-10-CM

## 2023-11-02 ENCOUNTER — Ambulatory Visit
Admission: RE | Admit: 2023-11-02 | Discharge: 2023-11-02 | Disposition: A | Discharge status: Home / Self Care | Source: Ambulatory Visit | Attending: Family | Admitting: Family

## 2023-11-02 DIAGNOSIS — R928 Other abnormal and inconclusive findings on diagnostic imaging of breast: Secondary | ICD-10-CM

## 2023-11-07 ENCOUNTER — Other Ambulatory Visit: Payer: Self-pay | Admitting: Neurology

## 2023-11-08 ENCOUNTER — Other Ambulatory Visit: Payer: Self-pay

## 2023-11-11 ENCOUNTER — Encounter

## 2023-11-11 ENCOUNTER — Other Ambulatory Visit

## 2023-11-16 ENCOUNTER — Other Ambulatory Visit

## 2023-11-16 ENCOUNTER — Encounter

## 2023-11-23 ENCOUNTER — Encounter: Payer: Self-pay | Admitting: Neurology

## 2023-11-23 NOTE — Telephone Encounter (Signed)
 Last seen on 01/17/23 Follow up scheduled on 02/13/24   Dispensed Days Supply Quantity Provider Pharmacy  METHYLPHENIDATE  HYDROCHLORIDE  20 MG TABS 10/13/2023 30 60 tablet Sater, Sherida Dimmer, MD [facility] [phone] Jacklin Mascot...      Rx pending to be signed

## 2023-12-07 MED ORDER — METHYLPHENIDATE HCL 20 MG PO TABS: 20.0000 mg | ORAL_TABLET | Freq: Two times a day (BID) | ORAL | 0 refills | Status: DC

## 2023-12-07 NOTE — Telephone Encounter (Signed)
 Received message via epic that pt had not read mychart. Called pt at [phone]. Pharmacy: [facility] [address] [place], [place] - [address] AT [facility] [address] Aware I will send to MD to send in for her.  Also scheduled her a sooner follow up for 12/21/23 at 1:30pm with Dr. Godwin Lat. Cx appt for 01/2024. Pt aware.

## 2023-12-21 ENCOUNTER — Encounter: Payer: Self-pay | Admitting: Neurology

## 2023-12-21 ENCOUNTER — Telehealth: Payer: Self-pay | Admitting: Neurology

## 2023-12-21 ENCOUNTER — Ambulatory Visit: Admitting: Neurology

## 2023-12-21 VITALS — BP 137/88 | HR 98 | Ht 63.0 in | Wt 208.0 lb

## 2023-12-21 DIAGNOSIS — M4317 Spondylolisthesis, lumbosacral region: Secondary | ICD-10-CM

## 2023-12-21 DIAGNOSIS — G35 Multiple sclerosis: Secondary | ICD-10-CM | POA: Diagnosis not present

## 2023-12-21 DIAGNOSIS — Z79899 Other long term (current) drug therapy: Secondary | ICD-10-CM

## 2023-12-21 DIAGNOSIS — M7138 Other bursal cyst, other site: Secondary | ICD-10-CM | POA: Diagnosis not present

## 2023-12-21 DIAGNOSIS — N319 Neuromuscular dysfunction of bladder, unspecified: Secondary | ICD-10-CM | POA: Diagnosis not present

## 2023-12-21 DIAGNOSIS — Q760 Spina bifida occulta: Secondary | ICD-10-CM

## 2023-12-21 DIAGNOSIS — F32A Depression, unspecified: Secondary | ICD-10-CM

## 2023-12-21 MED ORDER — GABAPENTIN 300 MG PO CAPS: 300.0000 mg | ORAL_CAPSULE | Freq: Three times a day (TID) | ORAL | 3 refills | Status: AC

## 2023-12-21 NOTE — Telephone Encounter (Signed)
 Urogynecology referral faxed to [facility] (fax# [phone], phone# [phone])

## 2023-12-21 NOTE — Progress Notes (Signed)
 [facility]  PATIENT: Mary Beck DOB: 03-02-1976   _________________________________   HISTORICAL  CHIEF COMPLAINT:  Chief Complaint  Patient presents with   Follow-up    Pt in room 11. Alone.Here for MS follow up. On Gilenya . Pt report issues urinary incontinent has gotten worse saw urologist last week. Would like a female urologist.  No falls, last eye exam was last year.     HISTORY OF PRESENT ILLNESS:  Mary Beck is a 48 y.o. woman with relapsing remitting MS diagnosed in 2010.    Update 12/21/2023: She is tolerating Gilenya  well for her RRMS.   She has no exacerbations but feels slightly weaker going upstairs..      She  has more urinary incontinence, worse the week before her period.    She is currently on Myrbetriq .  Oxybutynin  and Vesicare  had not helped much either.in monotherapy or combination with Myrbetriq     Leg pain is doing ok.  She had surgery 05/2016 in [facility] for a tethered cord and synovial cyst.   She feels left leg weakness and pain initially.  Current pain is left > right (was worse on right).      Lumbar MRI from 12/17/2022.  At L5-S1, there is grade 1 anterolisthesis that has occurred since 2019 and severe facet hypertrophy.  There is some residual synovial cyst on the right.  There is severe left greater than right foraminal narrowing with probable left L5 nerve root compression and there could also be impingement of the S1 nerve roots.  At L4-L5, there are degenerative changes that have progressed since 2019 this causes severe left foraminal narrowing with potential for left L4 nerve root compression.  Facet joints are also severely degenerated.  Gait is off balanced but stable compared to last year She prefers to use the bannister on stairs.   She had no major falls over last year.    Strength is a little reduced in legs, most noted in the mornings.  She has some spasticity in legs.  Legs are sometimes sore.     Vision is doing  well.   She has a recent eye exam and was told she had no MS related issues.      Bladder function is poor    She has some incontinence still.   She is better on Myrbetriq  50 mg than solifenacin  (vesicare ) .  Combination did not hepl more than Myrbetriq  alone.   Vision is fine.  She has fatigue daily which is helped by Adderall. It also helps mild cognitive issues - processing, word finding.  SHe had some difficulty with her last job.   Mood is ok with Wellbutrin  but she still has mild depression.    Sleep is usually ok.   She takes zolpidem  rarely for insomnia.       She take Vit D supplement daily.       MS HIstory:   She was diagnosed in 2010 after presenting with numbness in the feet up to her knees. She had an MRI of the brain that was abnormal. This was followed by a lumbar puncture in the CSF was also abnormal. She was diagnosed with MS and was placed on Copaxone. She had an exacerbation and was switched to Betaseron. Due to needle fatigue she was switched to Gilenya  about 2 years ago. Since being on Gilenya  her MS has been stable. She denies any exacerbations. She tolerates Gilenya  well. She had an MRI of the brain early 2015 that  was unchanged compared to her previous MRI.   MRI May 2017 shows a new large chronic focus in the left frontal lobe not present in 2015. MRI of the cervical spine shows 3 T2 hyperintense foci  IMAGING MRI of the lumbar spine 12/17/2022 shows MRI from 12/17/2022.  At L5-S1, there is grade 1 anterolisthesis that has occurred since 2019 and severe facet hypertrophy.  There is some residual synovial cyst on the right.  There is severe left greater than right foraminal narrowing with probable left L5 nerve root compression and there could also be impingement of the S1 nerve roots.  At L4-L5, there are degenerative changes that have progressed since 2019 this causes severe left foraminal narrowing with potential for left L4 nerve root compression.  Facet joints are also  severely degenerated.  MRI of the brain and cervical spine 12/10/2022 are unchanged compared to previous MRIs from 06/11/2020.  Typical MS changes in the brain.  Foci at C4-C5, C5, C6 and C7 in the cervical spinal cord.  Mild spinal stenosis at C5-C6 and C6-C7 but no definite nerve root compression.  MRI of the brain, cervical spine thoracic spine 06/16/2020 and 06/11/2020 were unchanged compared to the previous studies from 2019.  MRI brain 10/10/2017: 1.   Multiple T2/FLAIR hyperintense foci in the periventricular, juxtacortical and deep white matter of both hemispheres and in the right cerebellar hemisphere in a pattern and configuration consistent with chronic demyelinating plaque.  None of the foci appears to be acute.  When compared to the MRI dated  11/10/2015, there are no new lesions and 1 focus in the left frontal lobe appears much smaller on the current study.   2.    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 3.   There are no acute findings  MRI cervical 10/10/2017: IMPRESSION: This MRI of the cervical spine with and without contrast shows the following: 1.    There are several foci within the spinal cord adjacent to C2, C4-C5, C5 and C7 consistent with chronic demyelinating plaque associated with multiple sclerosis.  All these foci were noted on the 11/10/2015 MRI and they do not enhance. 2.    At C3-C4 there is a small left paramedian disc bulge not present on the 2017 MRI.  There is no nerve root compression. 3.    At C5-C6 there is stable disc bulging and mild bilateral foraminal narrowing but no nerve root compression. 4.    At C6-C7, there is stable disc protrusion and mild to moderate foraminal narrowing and mild spinal stenosis.  There is no nerve root compression. 5.    There is a normal enhancement pattern and there are no acute findings.SABRA        MRI thoracic 10/10/2017:  IMPRESSION: This MRI of the thoracic spine without contrast shows the following: 1.    Anterior to central focus within the spinal cord adjacent to T8 and central focus within the spinal cord adjacent to T11.  These are nonspecific but consistent with transverse myelitis or demyelination.. 2.    Small syrinx from T5-T6 to T7-T8    MRI lumbar 10/10/2017: IMPRESSION: This MRI of the lumbar spine with and without contrast shows the following: 1.    Posterior decompression from L3-S1. 2.    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  This is unchanged when compared  to the previous MRI. 3.    At L4-L5, there is left greater than right facet hypertrophy causing moderate left foraminal narrowing.  There is no definite nerve root compression though there is some encroachment upon the left L4 nerve root.  This appears stable. 4.    At L5-S1, there is severe right greater than left facet hypertrophy and a right synovial cyst causing severe right lateral recess stenosis.  This could lead to right S1 nerve root compression.  Changes appear stable compared to the 08/31/2016 MRI. 5.    There is mild epidural fibrosis to the right at L4-L5 and L5-S1.    MRI Brain 11/10/15 IMPRESSION:  This MRI of the brain with and without contrast shows the following: 1.    Scattered T2/FLAIR hyperintense foci in the hemispheres and right cerebellum in a pattern and configuration consistent with chronic demyelinating plaque associated with multiple sclerosis. None of the foci appears to be acute. However, when compared to the MRI dated 11/07/2013, one left frontal subcortical focus has occurred during the interim. 2,    There are no acute findings.  MRi Cervical 11/10/15 IMPRESSION:  This MRI of the cervical spine with and without contrast shows the following: 1.   There are three T2 hyperintense foci within the spinal cord as detailed above consistent with chronic  demyelinating plaque associated with multiple sclerosis. None of the foci enhances after contrast administration. 2.    Mild degenerative changes at C5-C6 and C6-C7 that did not lead to any nerve root impingement. 3.    There is a normal enhancement pattern.  2017   (pre-surgical) 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 2. Posterior element fusion anomaly/spina bifida at L3 and below. 3. Advanced facet disease in the lower lumbar spine. 4. 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  Other data: She saw Juliene Belts, Psychologist for neurocognitive evaluation in 12/18/2015: Overall, Mary Beck's neurocognitive profile is consistent with a mild neurocognitive disorder (i.e., MCI), predominantly dysexecutive, with the combination of her multiple sclerosis and mood symptomology as likely etiologies.      REVIEW OF SYSTEMS: Constitutional: No fevers, chills, sweats, or change in appetite.   She reports fatigue Eyes: No visual changes, double vision, .  However, mild eye pain Ear, nose and throat: No hearing loss, ear pain, nasal congestion, sore throat Cardiovascular: No chest pain, palpitations Respiratory:  No shortness of breath at rest or with exertion.   No wheezes GastrointestinaI: No nausea, vomiting, diarrhea, abdominal pain, fecal incontinence Genitourinary:  No dysuria or urinary retention.  She notes frequency and nocturia. Musculoskeletal:  No neck pain.  Notes back pain Integumentary: No rash, pruritus, skin lesions Neurological: as above.  Also, occasional Headache Psychiatric: Reports mild depression but little anxiety Endocrine: No palpitations, diaphoresis, change in appetite, change in weigh or increased thirst Hematologic/Lymphatic:  No anemia, purpura, petechiae. Allergic/Immunologic: No  itchy/runny eyes, nasal congestion, recent allergic reactions, rashes  ALLERGIES: No Known Allergies  HOME MEDICATIONS:  Current Outpatient Medications:    albuterol  (VENTOLIN  HFA) 108 (90 Base) MCG/ACT inhaler, Inhale 2 puffs into the lungs every 6 (six) hours as needed for wheezing or shortness of breath., Disp: 8 g, Rfl: 1   buPROPion  (WELLBUTRIN  XL) 300 MG 24 hr tablet, Take 1 tablet (300 mg total) by mouth daily., Disp:  90 tablet, Rfl: 0   etodolac  (LODINE ) 200 MG capsule, TAKE TWO CAPSULES BY MOUTH TWICE A DAY, Disp: 180 capsule, Rfl: 2   Fingolimod  HCl 0.5 MG CAPS, TAKE 1 CAPSULE BY MOUTH DAILY, Disp: 30 capsule, Rfl: 3   methylphenidate  (RITALIN ) 20 MG tablet, Take 1 tablet (20 mg total) by mouth 2 (two) times daily., Disp: 60 tablet, Rfl: 0   mirabegron  ER (MYRBETRIQ ) 50 MG TB24 tablet, TAKE ONE TABLET BY MOUTH ONE TIME DAILY, Disp: 90 tablet, Rfl: 1   zolpidem  (AMBIEN ) 5 MG tablet, TAKE ONE TABLET BY MOUTH EVERY NIGHT AT BEDTIME AS NEEDED FOR SLEEP, Disp: 30 tablet, Rfl: 5   DULoxetine  (CYMBALTA ) 60 MG capsule, Take 1 capsule (60 mg total) by mouth daily., Disp: 90 capsule, Rfl: 3   gabapentin  (NEURONTIN ) 300 MG capsule, Take 1 capsule (300 mg total) by mouth 3 (three) times daily., Disp: 270 capsule, Rfl: 3  PAST MEDICAL HISTORY: Past Medical History:  Diagnosis Date   Headache    Multiple sclerosis (HCC)    Pseudotumor cerebri    Spina bifida (HCC)     PAST SURGICAL HISTORY: Past Surgical History:  Procedure Laterality Date   BACK SURGERY  2017    FAMILY HISTORY: Family History  Problem Relation Age of Onset   Hypertension Mother    Thyroid disease Mother    Hypertension Father    Thyroid disease Sister    Cancer Maternal Grandmother        brain   Heart disease Maternal Grandfather    Multiple sclerosis Neg Hx     SOCIAL HISTORY:  Social History   Socioeconomic History   Marital status: Single    Spouse name: Not on file   Number of children: Not on  file   Years of education: Not on file   Highest education level: Not on file  Occupational History   Not on file  Tobacco Use   Smoking status: Never   Smokeless tobacco: Never  Vaping Use   Vaping status: Never Used  Substance and Sexual Activity   Alcohol use: No    Alcohol/week: 0.0 standard drinks of alcohol   Drug use: No   Sexual activity: Yes    Birth control/protection: None  Other Topics Concern   Not on file  Social History Narrative   Right Handed   Tea Occasionally    Social Drivers of Health   Financial Resource Strain: Not on file  Food Insecurity: Not on file  Transportation Needs: Not on file  Physical Activity: Not on file  Stress: Not on file  Social Connections: Not on file  Intimate Partner Violence: Unknown (09/21/2021)   Received from [facility]   HITS    Physically Hurt: Not on file    Insult or Talk Down To: Not on file    Threaten Physical Harm: Not on file    Scream or Curse: Not on file     PHYSICAL EXAM  Vitals:   12/21/23 1326  BP: 137/88  Pulse: 98  Weight: 208 lb (94.3 kg)  Height: 5' 3 (1.6 m)    Body mass index is 36.85 kg/m.   General: The patient is well-developed and well-nourished and in no acute distress    Neurologic Exam  Mental status: The patient is alert and oriented x 3 at the time of the examination. The patient has apparent normal recent and remote memory, with an apparently normal attention span and concentration ability.   Speech is normal.  Cranial nerves: Extraocular movements are full.     Facial symmetry is present. There is good facial sensation to soft touch bilaterally.Facial strength is normal.  Trapezius and sternocleidomastoid strength is normal. No dysarthria is noted.    No obvious hearing deficits are noted.  Motor:  Muscle bulk is normal.   Tone is normal. Strength is  5 / 5 in all 4 extremities . Can stand on toes and heels  Sensory: She has mildly reduced touch and vibration sensation in  the right hand compared to the left hand. Leg sensation was normal.   Coordination: Cerebellar testing reveals good finger-nose-finger and heel-to-shin bilaterally.  Gait and station: Station is normal.   Her gait is mildly wide.  The tandem gait is wide.  Romberg is negative.   Reflexes: Deep tendon reflexes are normal and symmetric in the arms.  Deep tendon reflexes were increased at the knees.  There is a crossed adductor response on the left.  Reduced right ankle reflex relative to left.  ASSESSMENT AND PLAN  1. Multiple sclerosis (HCC)   2. Spina bifida occulta   3. Neurogenic bladder   4. Synovial cyst of lumbar facet joint   5. Spondylolisthesis at L5-S1 level   6. Depression, unspecified depression type   7. High risk medication use      1.   Continue Gilenya  for MS.   We will check a CBC and CMP with differential today.     2.   Continue Myrbetriq  for bladder dysfunction.  It has helped her more than any other medication 3.   LBP is doing better but if back or leg pain worsens, refer back to neurosurgery for progressive changes at L4-L5 and L5-S1, now with anterolisthesis seen on MRI.  She had surgery for tethered cord and degenerative change in 2017. 4.    Continue Ritalin  for fatigue/ADD.  Duloxetine  and etodolac  for pain 5.    RTC  6 months or sooner if new or worsening neurologic problems.   Mary Beck A. Vear, MD, PhD 12/21/2023, 2:17 PM Certified in Neurology, Clinical Neurophysiology, Sleep Medicine, Pain Medicine and Neuroimaging  [facility] [address], [address] [place], [place] [address] [phone]  -

## 2023-12-22 ENCOUNTER — Ambulatory Visit: Payer: Self-pay | Admitting: Neurology

## 2023-12-22 LAB — COMPREHENSIVE METABOLIC PANEL WITH GFR
ALT: 22 IU/L (ref 0–32)
AST: 19 IU/L (ref 0–40)
Albumin: 4 g/dL (ref 3.9–4.9)
Alkaline Phosphatase: 82 IU/L (ref 44–121)
BUN/Creatinine Ratio: 24 — ABNORMAL HIGH (ref 9–23)
BUN: 20 mg/dL (ref 6–24)
Bilirubin Total: 0.2 mg/dL (ref 0.0–1.2)
CO2: 24 mmol/L (ref 20–29)
Calcium: 9.3 mg/dL (ref 8.7–10.2)
Chloride: 103 mmol/L (ref 96–106)
Creatinine, Ser: 0.85 mg/dL (ref 0.57–1.00)
Globulin, Total: 2.1 g/dL (ref 1.5–4.5)
Glucose: 86 mg/dL (ref 70–99)
Potassium: 4.5 mmol/L (ref 3.5–5.2)
Sodium: 141 mmol/L (ref 134–144)
Total Protein: 6.1 g/dL (ref 6.0–8.5)
eGFR: 84 mL/min/{1.73_m2} (ref >59)

## 2023-12-22 LAB — CBC WITH DIFFERENTIAL/PLATELET
Basophils Absolute: 0 10*3/uL (ref 0.0–0.2)
Basos: 0 %
EOS (ABSOLUTE): 0 10*3/uL (ref 0.0–0.4)
Eos: 1 %
Hematocrit: 39.9 % (ref 34.0–46.6)
Hemoglobin: 13.4 g/dL (ref 11.1–15.9)
Immature Grans (Abs): 0 10*3/uL (ref 0.0–0.1)
Immature Granulocytes: 0 %
Lymphocytes Absolute: 0.3 10*3/uL — ABNORMAL LOW (ref 0.7–3.1)
Lymphs: 7 %
MCH: 31.1 pg (ref 26.6–33.0)
MCHC: 33.6 g/dL (ref 31.5–35.7)
MCV: 93 fL (ref 79–97)
Monocytes Absolute: 0.5 10*3/uL (ref 0.1–0.9)
Monocytes: 13 %
Neutrophils Absolute: 3.3 10*3/uL (ref 1.4–7.0)
Neutrophils: 79 %
Platelets: 308 10*3/uL (ref 150–450)
RBC: 4.31 x10E6/uL (ref 3.77–5.28)
RDW: 13 % (ref 11.7–15.4)
WBC: 4.1 10*3/uL (ref 3.4–10.8)

## 2024-01-11 ENCOUNTER — Other Ambulatory Visit: Payer: Self-pay | Admitting: Neurology

## 2024-01-12 NOTE — Telephone Encounter (Signed)
 Last seen on 12/21/23 Follow up scheduled on 07/19/24

## 2024-02-08 ENCOUNTER — Encounter: Payer: Self-pay | Admitting: Neurology

## 2024-02-08 MED ORDER — METHYLPHENIDATE HCL 20 MG PO TABS: 20.0000 mg | ORAL_TABLET | Freq: Two times a day (BID) | ORAL | 0 refills | Status: DC

## 2024-02-08 NOTE — Telephone Encounter (Signed)
 Last seen on 12/21/23 Follow up scheduled on 07/19/24   Dispensed Days Supply Quantity Provider Pharmacy  METHYLPHENIDATE  HYDROCHLORIDE  20 MG TABS 12/07/2023 30 60 tablet Sater, Charlie LABOR, MD [facility] [phone] [place]...     Rx pending to be signed

## 2024-02-13 ENCOUNTER — Ambulatory Visit: Payer: 59 | Admitting: Neurology

## 2024-02-14 ENCOUNTER — Other Ambulatory Visit: Payer: Self-pay

## 2024-02-14 ENCOUNTER — Other Ambulatory Visit: Payer: Self-pay | Admitting: Neurology

## 2024-02-14 DIAGNOSIS — G35 Multiple sclerosis: Secondary | ICD-10-CM

## 2024-02-14 NOTE — Telephone Encounter (Signed)
 Last seen on 12/21/23 Follow up scheduled on 07/20/23

## 2024-03-02 ENCOUNTER — Ambulatory Visit: Admitting: Family

## 2024-03-02 ENCOUNTER — Encounter: Payer: Self-pay | Admitting: Family

## 2024-03-02 VITALS — BP 114/82 | HR 96 | Ht 63.0 in | Wt 205.4 lb

## 2024-03-02 DIAGNOSIS — Z13220 Encounter for screening for lipoid disorders: Secondary | ICD-10-CM

## 2024-03-02 DIAGNOSIS — R32 Unspecified urinary incontinence: Secondary | ICD-10-CM | POA: Diagnosis not present

## 2024-03-02 NOTE — Progress Notes (Signed)
 Mary Beck is a 48 y.o. 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   High risk medication use 03/11/2016   Neurogenic bladder 03/04/2016   Urinary incontinence without sensory awareness 03/04/2016   Major depressive disorder, recurrent episode, moderate (HCC) 03/02/2016   Synovial cyst of lumbar facet joint 10/21/2015   Spina bifida occulta 10/21/2015   Back pain, chronic 09/19/2015   Insomnia 09/19/2015   Allergic rhinitis, seasonal 09/19/2015   Right-sided low back pain with right-sided sciatica 01/15/2015   Leg pain 01/15/2015   Increased frequency of urination 08/07/2014   Other fatigue 08/07/2014   Depression 08/07/2014   Fatigue 08/07/2014   MS (multiple sclerosis) (HCC) 09/17/2011   Multiple sclerosis (HCC) 09/17/2011    Current Outpatient Medications  Medication Sig Dispense Refill   albuterol  (VENTOLIN  HFA) 108 (90 Base) MCG/ACT inhaler Inhale 2 puffs into the lungs every 6 (six) hours as needed for wheezing or shortness of breath. 8 g 1   buPROPion  (WELLBUTRIN  XL) 300 MG 24 hr tablet TAKE ONE TABLET BY MOUTH ONE TIME DAILY 90 tablet 1   etodolac  (LODINE ) 200 MG capsule TAKE TWO CAPSULES BY MOUTH TWICE A DAY 180 capsule 2   Fingolimod  HCl 0.5 MG CAPS TAKE 1 CAPSULE BY MOUTH DAILY 30 capsule 5   gabapentin  (NEURONTIN ) 300 MG capsule Take 1 capsule (300 mg total) by mouth 3 (three) times daily. 270 capsule 3   methylphenidate  (RITALIN ) 20 MG tablet Take 1 tablet (20 mg total) by mouth 2 (two) times daily. 60 tablet 0   mirabegron  ER (MYRBETRIQ ) 50 MG TB24 tablet  TAKE ONE TABLET BY MOUTH ONE TIME DAILY 90 tablet 1   zolpidem  (AMBIEN ) 5 MG tablet TAKE ONE TABLET BY MOUTH EVERY NIGHT AT BEDTIME AS NEEDED FOR SLEEP 30 tablet 5   No current facility-administered medications for this visit.    Allergies: Patient has no known allergies.  Past Medical History:  Diagnosis Date   Headache    Multiple sclerosis (HCC)    Pseudotumor cerebri    Spina bifida [facility])     Past Surgical History:  Procedure Laterality Date   BACK SURGERY  2017    Family History  Problem Relation Age of Onset   Hypertension Mother    Thyroid disease Mother    Hypertension Father    Thyroid disease Sister    Cancer Maternal Grandmother        brain   Heart disease Maternal Grandfather    Multiple sclerosis Neg Hx     Social History   Tobacco Use   Smoking status: Never   Smokeless tobacco: Never  Substance Use Topics   Alcohol use: No    Alcohol/week: 0.0 standard drinks of alcohol    Subjective:   Requesting referral to urogynecologist; would like to see a female provider; having increased problems with incontinence and not responding as well to Myrbetriq  as had hoped;  Would also like to return for fasting lab panel at later date;   Objective:  Vitals:   03/02/24 1000  BP: 114/82  Pulse: 96  SpO2: 99%  Weight: 205 lb 6.4  oz (93.2 kg)  Height: 5' 3 (1.6 m)    General: Well developed, well nourished, in no acute distress  Skin : Warm and dry.  Head: Normocephalic and atraumatic  Lungs: Respirations unlabored; clear to auscultation bilaterally without wheeze, rales, rhonchi  CVS exam: normal rate and regular rhythm.  Neurologic: Alert and oriented; speech intact; face symmetrical; moves all extremities well; CNII-XII intact without focal deficit   Assessment:  1. Urinary incontinence, unspecified type   2. Lipid screening     Plan:  Referral to urogynecology;  Return for fasting lab appointment;  She is aware that she will need to establish with  new PCP;   No follow-ups on file.  Orders Placed This Encounter  Procedures   Lipid panel    Standing Status:   Future    Expiration Date:   03/02/2025   Ambulatory referral to Urogynecology    Referral Priority:   Routine    Referral Type:   Consultation    Referral Reason:   Specialty Services Required    Requested Specialty:   Urology    Number of Visits Requested:   1    Requested Prescriptions    No prescriptions requested or ordered in this encounter

## 2024-03-05 ENCOUNTER — Other Ambulatory Visit: Payer: Self-pay | Admitting: Medical Genetics

## 2024-03-09 ENCOUNTER — Other Ambulatory Visit (INDEPENDENT_AMBULATORY_CARE_PROVIDER_SITE_OTHER)

## 2024-03-09 ENCOUNTER — Ambulatory Visit: Payer: Self-pay | Admitting: Family

## 2024-03-09 DIAGNOSIS — Z13220 Encounter for screening for lipoid disorders: Secondary | ICD-10-CM | POA: Diagnosis not present

## 2024-03-09 LAB — LIPID PANEL
Cholesterol: 191 mg/dL (ref 0–200)
HDL: 72.7 mg/dL (ref >39.00)
LDL Cholesterol: 107 mg/dL — ABNORMAL HIGH (ref 0–99)
NonHDL: 117.97
Total CHOL/HDL Ratio: 3
Triglycerides: 56 mg/dL (ref 0.0–149.0)
VLDL: 11.2 mg/dL (ref 0.0–40.0)

## 2024-03-16 ENCOUNTER — Other Ambulatory Visit: Payer: Self-pay | Admitting: Neurology

## 2024-03-19 NOTE — Telephone Encounter (Signed)
 Last seen on 12/21/23 Follow up scheduled on 07/20/23

## 2024-04-18 ENCOUNTER — Encounter: Payer: Self-pay | Admitting: Neurology

## 2024-04-18 MED ORDER — METHYLPHENIDATE HCL 20 MG PO TABS: 20.0000 mg | ORAL_TABLET | Freq: Two times a day (BID) | ORAL | 0 refills | Status: DC

## 2024-04-18 NOTE — Telephone Encounter (Signed)
 Requested Prescriptions   Pending Prescriptions Disp Refills   methylphenidate  (RITALIN ) 20 MG tablet 60 tablet 0    Sig: Take 1 tablet (20 mg total) by mouth 2 (two) times daily.   Last seen 12/21/23 Next appt scheduled 07/19/24 Dispenses   Dispensed Days Supply Quantity Provider Pharmacy  METHYLPHENIDATE  HYDROCHLORIDE  20 MG TABS 02/08/2024 30 60 tablet Sater, Charlie LABOR, MD [facility] [phone] [place]...  METHYLPHENIDATE  HYDROCHLORIDE  20 MG TABS 12/07/2023 30 60 tablet Sater, Charlie LABOR, MD [facility] [phone] [place]...  METHYLPHENIDATE  HYDROCHLORIDE  20 MG TABS 10/13/2023 30 60 tablet Sater, Charlie LABOR, MD [facility] [phone] [place]...  METHYLPHENIDATE  HYDROCHLORIDE  20 MG TABS 08/16/2023 30 60 tablet Sater, Charlie LABOR, MD [facility] [phone] [place]...  METHYLPHENIDATE  HYDROCHLORIDE  20 MG TABS 06/20/2023 30 60 tablet Sater, Charlie LABOR, MD [facility] [phone] [place]...  METHYLPHENIDATE  HYDROCHLORIDE  20 MG TABS 04/25/2023 30 60 tablet Sater, Charlie LABOR, MD [facility] [phone] [place].SABRASABRA

## 2024-06-18 ENCOUNTER — Encounter: Payer: Self-pay | Admitting: Neurology

## 2024-06-25 ENCOUNTER — Encounter: Payer: Self-pay | Admitting: Obstetrics

## 2024-06-25 ENCOUNTER — Other Ambulatory Visit: Payer: Self-pay | Admitting: Neurology

## 2024-06-26 NOTE — Telephone Encounter (Signed)
 Last seen 12/21/23 Next appt 07/19/24 Dispenses   Dispensed Days Supply Quantity Provider Pharmacy  METHYLPHENIDATE  HYDROCHLORIDE  20 MG TABS 04/18/2024 30 60 tablet Sater, Charlie LABOR, MD [facility] [phone] [place]...  METHYLPHENIDATE  HYDROCHLORIDE  20 MG TABS 02/08/2024 30 60 tablet Sater, Charlie LABOR, MD [facility] [phone] [place]...  METHYLPHENIDATE  HYDROCHLORIDE  20 MG TABS 12/07/2023 30 60 tablet Sater, Charlie LABOR, MD [facility] [phone] [place]...  METHYLPHENIDATE  HYDROCHLORIDE  20 MG TABS 10/13/2023 30 60 tablet Sater, Charlie LABOR, MD [facility] [phone] [place]...  METHYLPHENIDATE  HYDROCHLORIDE  20 MG TABS 08/16/2023 30 60 tablet Sater, Charlie LABOR, MD [facility] [phone] [place].SABRASABRA

## 2024-07-02 ENCOUNTER — Encounter: Payer: Self-pay | Admitting: Neurology

## 2024-07-02 ENCOUNTER — Encounter: Payer: Self-pay | Admitting: *Deleted

## 2024-07-02 MED ORDER — METHYLPHENIDATE HCL 20 MG PO TABS: 20.0000 mg | ORAL_TABLET | Freq: Two times a day (BID) | ORAL | 0 refills | Status: AC

## 2024-07-02 NOTE — Telephone Encounter (Signed)
 Requested Prescriptions   Pending Prescriptions Disp Refills   methylphenidate  (RITALIN ) 20 MG tablet 60 tablet 0    Sig: Take 1 tablet (20 mg total) by mouth 2 (two) times daily.   Last seen 12/21/23 Next appt 07/19/24 Dispenses   Dispensed Days Supply Quantity Provider Pharmacy  METHYLPHENIDATE  HYDROCHLORIDE  20 MG TABS 06/26/2024 30 60 tablet Sater, Charlie LABOR, MD [facility] [phone] [place]...  METHYLPHENIDATE  HYDROCHLORIDE  20 MG TABS 04/18/2024 30 60 tablet Sater, Charlie LABOR, MD [facility] [phone] [place]...  METHYLPHENIDATE  HYDROCHLORIDE  20 MG TABS 02/08/2024 30 60 tablet Sater, Charlie LABOR, MD [facility] [phone] [place]...  METHYLPHENIDATE  HYDROCHLORIDE  20 MG TABS 12/07/2023 30 60 tablet Sater, Charlie LABOR, MD [facility] [phone] [place]...  METHYLPHENIDATE  HYDROCHLORIDE  20 MG TABS 10/13/2023 30 60 tablet Sater, Charlie LABOR, MD [facility] [phone] [place]...  METHYLPHENIDATE  HYDROCHLORIDE  20 MG TABS 08/16/2023 30 60 tablet Sater, Charlie LABOR, MD [facility] [phone] [place].SABRASABRA

## 2024-07-17 ENCOUNTER — Other Ambulatory Visit: Payer: Self-pay | Admitting: Neurology

## 2024-07-17 NOTE — Telephone Encounter (Signed)
 Last seen on 12/21/23 Follow up scheduled on 07/19/24   Did you want patient to continue bupropion  300 mg tablets?

## 2024-07-19 ENCOUNTER — Encounter: Payer: Self-pay | Admitting: Neurology

## 2024-07-19 ENCOUNTER — Ambulatory Visit: Admitting: Neurology

## 2024-07-19 VITALS — BP 128/86 | HR 92 | Ht 63.0 in | Wt 209.5 lb

## 2024-07-19 DIAGNOSIS — M25561 Pain in right knee: Secondary | ICD-10-CM | POA: Diagnosis not present

## 2024-07-19 DIAGNOSIS — M4317 Spondylolisthesis, lumbosacral region: Secondary | ICD-10-CM

## 2024-07-19 DIAGNOSIS — M549 Dorsalgia, unspecified: Secondary | ICD-10-CM | POA: Diagnosis not present

## 2024-07-19 DIAGNOSIS — Z79899 Other long term (current) drug therapy: Secondary | ICD-10-CM

## 2024-07-19 DIAGNOSIS — G35D Multiple sclerosis, unspecified: Secondary | ICD-10-CM

## 2024-07-19 DIAGNOSIS — G8929 Other chronic pain: Secondary | ICD-10-CM

## 2024-07-19 DIAGNOSIS — N319 Neuromuscular dysfunction of bladder, unspecified: Secondary | ICD-10-CM | POA: Diagnosis not present

## 2024-07-19 DIAGNOSIS — G35A Relapsing-remitting multiple sclerosis: Secondary | ICD-10-CM | POA: Diagnosis not present

## 2024-07-19 DIAGNOSIS — Q760 Spina bifida occulta: Secondary | ICD-10-CM

## 2024-07-19 MED ORDER — FINGOLIMOD HCL 0.5 MG PO CAPS: 1.0000 | ORAL_CAPSULE | Freq: Every day | ORAL | 3 refills | Status: AC

## 2024-07-19 NOTE — Progress Notes (Signed)
 "  [facility]  PATIENT: Mary Beck DOB: Aug 21, 1975   _________________________________   HISTORICAL  CHIEF COMPLAINT:  Chief Complaint  Patient presents with   Multiple Sclerosis    RM11, ALONE, MS FOLLOW UP     HISTORY OF PRESENT ILLNESS:  Mary Beck is a 49 y.o. woman with relapsing remitting MS diagnosed in 2010.    Update 07/19/2024: She is tolerating fingolimod  well for her RRMS.   She missed 5 days in December due to specialty pharmacy issues.  She has no exacerbations   Her gait is off balanced and she feels it is worse possibly due to R>L knee pain.  Legs also feel stiff, R>L.   This seems worse in the mornings.   She prefers to use the bannister on stairs.   She had no major falls over last year.    Strength is a little reduced in legs, most noted in the mornings.     She  has more urinary incontinence and she will be seeing urogynecology.    She is currently on Myrbetriq .  Oxybutynin  and Vesicare  had not helped much either.in monotherapy or combination with Myrbetriq     She has some LBP and leg pain.  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   She feels left leg weakness and pain initially.   Gabapentin  sometimes helps.      Her right knee is hurting more, worse with certain movements.  Her leg has given out a few times due to pain.   Has not seen orthopedics.    Lumbar MRI from 12/17/2022.  At L5-S1, there is grade 1 anterolisthesis that has occurred since 2019 and severe facet hypertrophy.  There is residual synovial cyst on the right.  There is severe left greater than right foraminal narrowing with probable left L5 nerve root compression and there could also be impingement of the S1 nerve roots.  At L4-L5, there are degenerative changes that have progressed since 2019 this causes severe left foraminal narrowing with potential for left L4 nerve root compression.  Facet joints are also severely  degenerated.  Vision is doing well.   She has a recent eye exam and was told she had no MS related issues.       She has fatigue daily which is helped by Ritalin . It also helps mild cognitive issues - processing, word finding.  SHe had some difficulty with her last job.   Mood is ok with Wellbutrin  but she still has mild depression.    Sleep is usually ok.   She takes zolpidem  rarely for insomnia.       She take Vit D supplement daily.       MS HIstory:   She was diagnosed in 2010 after presenting with numbness in the feet up to her knees. She had an MRI of the brain that was abnormal. This was followed by a lumbar puncture in the CSF was also abnormal. She was diagnosed with MS and was placed on Copaxone. She had an exacerbation and was switched to Betaseron. Due to needle fatigue she was switched to Gilenya  about 2 years ago. Since being on Gilenya  her MS has been stable. She denies any exacerbations. She tolerates Gilenya  well. She had an MRI of the brain early 2015 that was unchanged compared to her previous MRI.   MRI May 2017 shows a new large chronic focus in the left frontal lobe not present in 2015. MRI of  the cervical spine shows 3 T2 hyperintense foci  IMAGING MRI of the lumbar spine 12/17/2022 shows MRI from 12/17/2022.  At L5-S1, there is grade 1 anterolisthesis that has occurred since 2019 and severe facet hypertrophy.  There is some residual synovial cyst on the right.  There is severe left greater than right foraminal narrowing with probable left L5 nerve root compression and there could also be impingement of the S1 nerve roots.  At L4-L5, there are degenerative changes that have progressed since 2019 this causes severe left foraminal narrowing with potential for left L4 nerve root compression.  Facet joints are also severely degenerated.  MRI of the brain and cervical spine 12/10/2022 are unchanged compared to previous MRIs from 06/11/2020.  Typical MS changes in the brain.  Foci at  C4-C5, C5, C6 and C7 in the cervical spinal cord.  Mild spinal stenosis at C5-C6 and C6-C7 but no definite nerve root compression.  MRI of the brain, cervical spine thoracic spine 06/16/2020 and 06/11/2020 were unchanged compared to the previous studies from 2019.  MRI brain 10/10/2017: 1.   Multiple T2/FLAIR hyperintense foci in the periventricular, juxtacortical and deep white matter of both hemispheres and in the right cerebellar hemisphere in a pattern and configuration consistent with chronic demyelinating plaque.  None of the foci appears to be acute.  When compared to the MRI dated  11/10/2015, there are no new lesions and 1 focus in the left frontal lobe appears much smaller on the current study.   2.    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 3.   There are no acute findings  MRI cervical 10/10/2017: IMPRESSION: This MRI of the cervical spine with and without contrast shows the following: 1.    There are several foci within the spinal cord adjacent to C2, C4-C5, C5 and C7 consistent with chronic demyelinating plaque associated with multiple sclerosis.  All these foci were noted on the 11/10/2015 MRI and they do not enhance. 2.    At C3-C4 there is a small left paramedian disc bulge not present on the 2017 MRI.  There is no nerve root compression. 3.    At C5-C6 there is stable disc bulging and mild bilateral foraminal narrowing but no nerve root compression. 4.    At C6-C7, there is stable disc protrusion and mild to moderate foraminal narrowing and mild spinal stenosis.  There is no nerve root compression. 5.    There is a normal enhancement pattern and there are no acute findings.SABRA        MRI thoracic 10/10/2017: IMPRESSION: This MRI of the thoracic spine without contrast shows the following: 1.    Anterior to central focus within the spinal cord adjacent to T8 and central focus within  the spinal cord adjacent to T11.  These are nonspecific but consistent with transverse myelitis or demyelination.. 2.    Small syrinx from T5-T6 to T7-T8    MRI lumbar 10/10/2017: IMPRESSION: This MRI of the lumbar spine with and without contrast shows the following: 1.    Posterior decompression from L3-S1. 2.    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  This is unchanged when compared to the previous MRI. 3.    At L4-L5, there is left greater than right facet hypertrophy causing moderate left foraminal narrowing.  There is no definite nerve  root compression though there is some encroachment upon the left L4 nerve root.  This appears stable. 4.    At L5-S1, there is severe right greater than left facet hypertrophy and a right synovial cyst causing severe right lateral recess stenosis.  This could lead to right S1 nerve root compression.  Changes appear stable compared to the 08/31/2016 MRI. 5.    There is mild epidural fibrosis to the right at L4-L5 and L5-S1.    MRI Brain 11/10/15 IMPRESSION:  This MRI of the brain with and without contrast shows the following: 1.    Scattered T2/FLAIR hyperintense foci in the hemispheres and right cerebellum in a pattern and configuration consistent with chronic demyelinating plaque associated with multiple sclerosis. None of the foci appears to be acute. However, when compared to the MRI dated 11/07/2013, one left frontal subcortical focus has occurred during the interim. 2,    There are no acute findings.  MRi Cervical 11/10/15 IMPRESSION:  This MRI of the cervical spine with and without contrast shows the following: 1.   There are three T2 hyperintense foci within the spinal cord as detailed above consistent with chronic demyelinating plaque associated with multiple sclerosis. None of the foci enhances after contrast administration. 2.    Mild degenerative changes at C5-C6 and C6-C7 that did not lead  to any nerve root impingement. 3.    There is a normal enhancement pattern.  2017   (pre-surgical) 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 2. Posterior element fusion anomaly/spina bifida at L3 and below. 3. Advanced facet disease in the lower lumbar spine. 4. 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  Other data: She saw Juliene Belts, Psychologist for neurocognitive evaluation in 12/18/2015: Overall, Ms. Shenoy neurocognitive profile is consistent with a mild neurocognitive disorder (i.e., MCI), predominantly dysexecutive, with the combination of her multiple sclerosis and mood symptomology as likely etiologies.      REVIEW OF SYSTEMS: Constitutional: No fevers, chills, sweats, or change in appetite.   She reports fatigue Eyes: No visual changes, double vision, .  However, mild eye pain Ear, nose and throat: No hearing loss, ear pain, nasal congestion, sore throat Cardiovascular: No chest pain, palpitations Respiratory:  No shortness of breath at rest or with exertion.   No wheezes GastrointestinaI: No nausea, vomiting, diarrhea, abdominal pain, fecal incontinence Genitourinary:  No dysuria or urinary retention.  She notes frequency and nocturia. Musculoskeletal:  No neck pain.  Notes back pain Integumentary: No rash, pruritus, skin lesions Neurological: as above.  Also, occasional Headache Psychiatric: Reports mild depression but little anxiety Endocrine: No palpitations, diaphoresis, change in appetite, change in weigh or increased thirst Hematologic/Lymphatic:  No anemia, purpura, petechiae. Allergic/Immunologic: No itchy/runny eyes, nasal congestion, recent allergic reactions, rashes  ALLERGIES: No Known Allergies  HOME MEDICATIONS:  Current Outpatient Medications:    albuterol  (VENTOLIN  HFA)  108 (90 Base) MCG/ACT inhaler, Inhale 2 puffs into the lungs every 6 (six) hours as needed for wheezing or shortness of breath., Disp: 8 g, Rfl: 1   buPROPion  (WELLBUTRIN  XL) 300 MG 24 hr tablet, TAKE ONE TABLET BY MOUTH ONE TIME DAILY, Disp: 90 tablet, Rfl: 1   etodolac  (LODINE ) 200 MG capsule, TAKE TWO CAPSULES BY MOUTH TWICE A DAY, Disp: 180 capsule, Rfl: 3   Fingolimod  HCl 0.5 MG  CAPS, TAKE 1 CAPSULE BY MOUTH DAILY, Disp: 30 capsule, Rfl: 5   gabapentin  (NEURONTIN ) 300 MG capsule, Take 1 capsule (300 mg total) by mouth 3 (three) times daily., Disp: 270 capsule, Rfl: 3   methylphenidate  (RITALIN ) 20 MG tablet, Take 1 tablet (20 mg total) by mouth 2 (two) times daily., Disp: 60 tablet, Rfl: 0   mirabegron  ER (MYRBETRIQ ) 50 MG TB24 tablet, TAKE ONE TABLET BY MOUTH ONE TIME DAILY, Disp: 90 tablet, Rfl: 4   zolpidem  (AMBIEN ) 5 MG tablet, TAKE ONE TABLET BY MOUTH EVERY NIGHT AT BEDTIME AS NEEDED FOR SLEEP, Disp: 30 tablet, Rfl: 5  PAST MEDICAL HISTORY: Past Medical History:  Diagnosis Date   Headache    Multiple sclerosis    Pseudotumor cerebri    Spina bifida (HCC)     PAST SURGICAL HISTORY: Past Surgical History:  Procedure Laterality Date   BACK SURGERY  2017    FAMILY HISTORY: Family History  Problem Relation Age of Onset   Hypertension Mother    Thyroid disease Mother    Hypertension Father    Thyroid disease Sister    Cancer Maternal Grandmother        brain   Heart disease Maternal Grandfather    Multiple sclerosis Neg Hx     SOCIAL HISTORY:  Social History   Socioeconomic History   Marital status: Single    Spouse name: Not on file   Number of children: Not on file   Years of education: Not on file   Highest education level: Not on file  Occupational History   Not on file  Tobacco Use   Smoking status: Never   Smokeless tobacco: Never  Vaping Use   Vaping status: Never Used  Substance and Sexual Activity   Alcohol use: No    Alcohol/week: 0.0 standard  drinks of alcohol   Drug use: No   Sexual activity: Yes    Birth control/protection: None  Other Topics Concern   Not on file  Social History Narrative   Right Handed   Tea Occasionally    Social Drivers of Health   Tobacco Use: Low Risk (07/19/2024)   Patient History    Smoking Tobacco Use: Never    Smokeless Tobacco Use: Never    Passive Exposure: Not on file  Financial Resource Strain: Not on file  Food Insecurity: Not on file  Transportation Needs: Not on file  Physical Activity: Not on file  Stress: Not on file  Social Connections: Not on file  Intimate Partner Violence: Unknown (09/21/2021)   Received from [facility]   HITS    Physically Hurt: Not on file    Insult or Talk Down To: Not on file    Threaten Physical Harm: Not on file    Scream or Curse: Not on file  Depression (PHQ2-9): Low Risk (03/02/2024)   Depression (PHQ2-9)    PHQ-2 Score: 0  Alcohol Screen: Not on file  Housing: Not on file  Utilities: Not on file  Health Literacy: Not on file     PHYSICAL EXAM  Vitals:   07/19/24 1604  BP: 128/86  Pulse: 92  SpO2: 97%  Weight: 209 lb 8 oz (95 kg)  Height: 5' 3 (1.6 m)    Body mass index is 37.11 kg/m.   General: The patient is well-developed and well-nourished and in no acute distress    Neurologic Exam  Mental status: The patient is alert and oriented x 3 at the time of the examination.  The patient has apparent normal recent and remote memory, with an apparently normal attention span and concentration ability.   Speech is normal.  Cranial nerves: Extraocular movements are full.     Facial symmetry is present. There is good facial sensation to soft touch bilaterally.Facial strength is normal.  Trapezius and sternocleidomastoid strength is normal. No dysarthria is noted.    No obvious hearing deficits are noted.  Motor:  Muscle bulk is normal.   Tone is normal. Strength is  5 / 5 in all 4 extremities . Can stand on toes and heels  Sensory:  She has mildly reduced touch and vibration sensation in the right hand compared to the left hand. Leg sensation was normal.   Coordination: Cerebellar testing reveals good finger-nose-finger and heel-to-shin bilaterally.  Gait and station: Station is normal.   Her gait is mildly wide.  The tandem gait is wide.  Romberg is negative.   Reflexes: Deep tendon reflexes are normal and symmetric in the arms.  Deep tendon reflexes were increased at the knees.  There is a crossed adductor response on the left.  Reduced right ankle reflex relative to left.  ASSESSMENT AND PLAN  1. Multiple sclerosis, relapsing-remitting   2. Spina bifida occulta   3. Neurogenic bladder   4. Spondylolisthesis at L5-S1 level   5. High risk medication use   6. Chronic back pain, unspecified back location, unspecified back pain laterality       1.   Continue Gilenya  for MS.  Check CBC and CMP with differential today.     2.   Continue Myrbetriq  for bladder dysfunction.  It has helped her more than any other medication 3.   LBP is doing better but if back or leg pain worsens, refer back to neurosurgery for progressive changes at L4-L5 and L5-S1, now with anterolisthesis seen on MRI.  She had surgery for tethered cord and degenerative change in 2017. 4.    Continue Ritalin  for fatigue/ADD.  Duloxetine  and etodolac  for pain 5.    Refer to Orthopedics for right knee pain.  Etodolac  has not helped this much. RTC  6 months or sooner if new or worsening neurologic problems.   Odester Nilson A. Vear, MD, PhD 07/19/2024, 4:12 PM Certified in Neurology, Clinical Neurophysiology, Sleep Medicine, Pain Medicine and Neuroimaging  [facility] [address] [place], [place] [address] [phone]  -      "

## 2024-07-20 ENCOUNTER — Ambulatory Visit: Admitting: Obstetrics

## 2024-07-20 ENCOUNTER — Telehealth: Payer: Self-pay | Admitting: Neurology

## 2024-07-20 ENCOUNTER — Ambulatory Visit: Payer: Self-pay | Admitting: Neurology

## 2024-07-20 LAB — CBC WITH DIFFERENTIAL/PLATELET
Basophils Absolute: 0 10*3/uL (ref 0.0–0.2)
Basos: 0 %
EOS (ABSOLUTE): 0 10*3/uL (ref 0.0–0.4)
Eos: 1 %
Hematocrit: 41.1 % (ref 34.0–46.6)
Hemoglobin: 13.8 g/dL (ref 11.1–15.9)
Immature Grans (Abs): 0 10*3/uL (ref 0.0–0.1)
Immature Granulocytes: 0 %
Lymphocytes Absolute: 0.3 10*3/uL — ABNORMAL LOW (ref 0.7–3.1)
Lymphs: 7 %
MCH: 30.8 pg (ref 26.6–33.0)
MCHC: 33.6 g/dL (ref 31.5–35.7)
MCV: 92 fL (ref 79–97)
Monocytes Absolute: 0.6 10*3/uL (ref 0.1–0.9)
Monocytes: 16 %
Neutrophils Absolute: 2.7 10*3/uL (ref 1.4–7.0)
Neutrophils: 76 %
Platelets: 309 10*3/uL (ref 150–450)
RBC: 4.48 x10E6/uL (ref 3.77–5.28)
RDW: 12.9 % (ref 11.7–15.4)
WBC: 3.6 10*3/uL (ref 3.4–10.8)

## 2024-07-20 LAB — HEPATIC FUNCTION PANEL
ALT: 33 [IU]/L — ABNORMAL HIGH (ref 0–32)
AST: 31 [IU]/L (ref 0–40)
Albumin: 4.1 g/dL (ref 3.9–4.9)
Alkaline Phosphatase: 96 [IU]/L (ref 41–116)
Bilirubin Total: 0.3 mg/dL (ref 0.0–1.2)
Bilirubin, Direct: 0.12 mg/dL (ref 0.00–0.40)
Total Protein: 6.1 g/dL (ref 6.0–8.5)

## 2024-07-20 NOTE — Progress Notes (Unsigned)
 " New Patient Evaluation and Consultation  Referring Provider: Jason Leita Repine, FNP PCP: Jason Leita Repine, FNP (Inactive) Date of Service: 07/20/2024  SUBJECTIVE Chief Complaint: No chief complaint on file.  History of Present Illness: Mary Beck is a 49 y.o. {ED SANE [phone] female seen in consultation at the request of NP [facility] for evaluation of urinary incontinence.    Tried overactive bladder medication Multiple sclerosus   ***Review of records significant for: ***  Urinary Symptoms: Leaks urine with going from sitting to standing, with a full bladder, with movement to the bathroom, with urgency, and while asleep Leaks 6+ time(s) per days.  Pad use: 6-8 adult diapers per day.   Patient is bothered by UI symptoms.  Day time voids 8-10.  Nocturia: 3-4 times per night to void. Voiding dysfunction:  does not empty bladder well.  Patient does not use a catheter to empty bladder.  When urinating, patient feels a weak stream, difficulty starting urine stream, dribbling after finishing, and the need to urinate multiple times in a row Drinks: ***oz water per day  UTIs: 0 UTI's in the last year.   {ACTIONS;DENIES/REPORTS:21021675::Denies} history of blood in urine, pyelonephritis, bladder cancer, and kidney cancer History of kidney stone*** No results found for the last 90 days.   Pelvic Organ Prolapse Symptoms:                  Patient Denies a feeling of a bulge the vaginal area.   Bowel Symptom: Bowel movements: 4 time(s) per week Stool consistency: soft  Straining: no.  Splinting: no.  Incomplete evacuation: yes.  Patient Denies accidental bowel leakage / fecal incontinence Bowel regimen: {bowel regimen:24759} Last colonoscopy: Date ***, Results *** [facility] Colonoscopy   This patient has no relevant Health Maintenance data.     Sexual Function Sexually active: no.  Sexual orientation: Bisexual Pain with sex: No  Pelvic Pain Denies pelvic  pain Location: *** Pain occurs: *** Prior pain treatment: *** Improved by: *** Worsened by: ***   Past Medical History:  Past Medical History:  Diagnosis Date   Headache    Multiple sclerosis    Pseudotumor cerebri    Spina bifida (HCC)      Past Surgical History:   Past Surgical History:  Procedure Laterality Date   BACK SURGERY  2017     Past OB/GYN History: OB History  No obstetric history on file.    Vaginal deliveries: ***,  Forceps/ Vacuum deliveries: ***, Cesarean section: *** Menopausal: No, LMP Patient's last menstrual period was 07/19/2024 (exact date). Contraception: n/a. Last pap smear was ***2014.  Any history of abnormal pap smears: no. No results found for: DIAGPAP, HPVHIGH, ADEQPAP  Medications: Patient has a current medication list which includes the following prescription(s): albuterol , bupropion , etodolac , fingolimod  hcl, gabapentin , methylphenidate , mirabegron  er, and zolpidem .   Allergies: Patient has no known allergies.   Social History: Social History[1]  Relationship status: single Patient lives with her flatmates.   Patient is employed chief executive officer for [employer] . Regular exercise: No History of abuse: No  Family History:   Family History  Problem Relation Age of Onset   Hypertension Mother    Thyroid disease Mother    Hypertension Father    Thyroid disease Sister    Cancer Maternal Grandmother        brain   Heart disease Maternal Grandfather    Multiple sclerosis Neg Hx      Review of Systems: Review of Systems  Constitutional:  Positive for malaise/fatigue. Negative for fever and weight loss.       Weight gain  Respiratory:  Negative for cough, shortness of breath and wheezing.   Cardiovascular:  Positive for leg swelling. Negative for chest pain and palpitations.  Gastrointestinal:  Negative for abdominal pain and blood in stool.  Genitourinary:  Positive for frequency and urgency.  Negative for dysuria and hematuria.       Leakage  Skin:  Negative for rash.  Neurological:  Positive for headaches. Negative for dizziness and weakness.  Endo/Heme/Allergies:  Does not bruise/bleed easily.  Psychiatric/Behavioral:  Positive for depression. The patient is not nervous/anxious.      OBJECTIVE Physical Exam: There were no vitals filed for this visit.  Physical Exam Constitutional:      General: She is not in acute distress.    Appearance: Normal appearance.  Genitourinary:     Bladder and urethral meatus normal.     No lesions in the vagina.     Right Labia: No rash, tenderness, lesions, skin changes or Bartholin's cyst.    Left Labia: No tenderness, lesions, skin changes, Bartholin's cyst or rash.    No vaginal discharge, erythema, tenderness, bleeding, ulceration or granulation tissue.      Right Adnexa: not tender, not full and no mass present.    Left Adnexa: not tender, not full and no mass present.    No cervical motion tenderness, discharge, friability, lesion, polyp or nabothian cyst.     Uterus is not enlarged, fixed, tender or irregular.     No uterine mass detected.    Urethral meatus caruncle not present.    No urethral prolapse, tenderness, mass, hypermobility or discharge present.     Bladder is not tender, urgency on palpation not present and masses not present.      Levator ani not tender, obturator internus not tender, no asymmetrical contractions present and no pelvic spasms present.    Anal wink present and BC reflex present. Cardiovascular:     Rate and Rhythm: Normal rate.  Pulmonary:     Effort: Pulmonary effort is normal. No respiratory distress.  Abdominal:     General: There is no distension.     Palpations: There is no mass.     Tenderness: There is no abdominal tenderness.     Hernia: No hernia is present.  Neurological:     Mental Status: She is alert.  Vitals reviewed. Exam conducted with a chaperone present.      POP-Q:    POP-Q                                               Aa                                               Ba                                                 C  Gh                                               Pb                                               tvl                                                Ap                                               Bp                                                 D      Rectal Exam:  Normal sphincter tone, {rectocele:24766} distal rectocele, enterocoele {DESC; PRESENT/NOT PRESENT:21021351}, no rectal masses, {sign of:24767} dyssynergia when asking the patient to bear down.  Post-Void Residual (PVR) by Bladder Scan: In order to evaluate bladder emptying, we discussed obtaining a postvoid residual and patient agreed to this procedure.  Procedure: The ultrasound unit was placed on the patient's abdomen in the suprapubic region after the patient had voided.      Laboratory Results: Lab Results  Component Value Date   BILIRUBINUR SMALL (A) 05/18/2016   PROTEINUR NEGATIVE 05/18/2016   LEUKOCYTESUR SMALL (A) 05/18/2016    Lab Results  Component Value Date   CREATININE 0.85 12/21/2023   CREATININE 0.84 01/17/2023   CREATININE 0.73 06/28/2022    No results found for: HGBA1C  Lab Results  Component Value Date   HGB 13.8 07/19/2024     ASSESSMENT AND PLAN Mary Beck is a 49 y.o. with: No diagnosis found.  There are no diagnoses linked to this encounter.   Mary ONEIDA Gillis, MD        [1]  Social History Tobacco Use   Smoking status: Never   Smokeless tobacco: Never  Vaping Use   Vaping status: Never Used  Substance Use Topics   Alcohol use: No    Alcohol/week: 0.0 standard drinks of alcohol   Drug use: No   "

## 2024-07-20 NOTE — Telephone Encounter (Signed)
 Referral To Orthopedics faxed to [facility]   [facility]  Phone:[phone] Fax : [phone]

## 2024-07-23 ENCOUNTER — Ambulatory Visit: Admitting: Obstetrics

## 2024-07-25 NOTE — Progress Notes (Unsigned)
 " New Patient Evaluation and Consultation  Referring Provider: Jason Leita Repine, FNP PCP: Jason Leita Repine, FNP (Inactive) Date of Service: 07/26/2024  SUBJECTIVE Chief Complaint: No chief complaint on file.  History of Present Illness: Mary Beck is a 49 y.o. White or Caucasian female seen in consultation at the request of NP [facility] for evaluation of neurogenic bladder.    History of neurogenic bladder and 3-90mm R ureteral calculus with hydronephrosis managed by Dr. Roseann, last seen in 2019. ***Flomax  Urodynamics 03/23/16 Small capacity unstable bladder. She was able to inhibit her unstable contractions during the test and did not leak. No SUI with cough or valsalva. She was able to void voluntarily with a high pressure voluntary detrusor contraction, increased EMG activity and an obstructed flow pattern. Her double void PVR was 121 ml. ? DESD. SABRA Unable to void for uroflow, catheterized for .  DO noted.  First urge 257 mL, Normal Urge 313 mL, Strong Urge 337 mL.  Pdet at Q max = 45 cm/H2O Max Pdet = 75 cm/H2O Max Flow = 4 mL/sec Voided Volume = 125 mL PVR = 321 mL  Double Void PVR = 121 mL Voiding pattern was Obstructed.  Remitting Multiple sclerosis with gait disturbance since 2019 managed by Dr. Vear. 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  Pad use: 6-8 adult diapers per day.   Patient is bothered by UI symptoms.  Day time voids 6.  Nocturia: 3 times per night to void with insomnia Voiding  dysfunction:  does not empty bladder well.  Patient does not use a catheter to empty bladder.  When urinating, patient feels a weak stream, difficulty starting urine stream, dribbling after finishing, the need to urinate multiple times in a row, and to push on her belly or vagina to empty bladder Drinks: ***oz water per day  UTIs: 0 UTI's in the last year.   {ACTIONS;DENIES/REPORTS:21021675::Denies} history of blood in urine, pyelonephritis, bladder cancer, and kidney cancer Kidney stone *** No results found for the last 90 days.   Pelvic Organ Prolapse Symptoms:                  Patient Denies a feeling of a bulge the vaginal area.   Bowel Symptom: Bowel movements: *** time(s) per week Stool consistency: soft  Straining: no.  Splinting: no.  Incomplete evacuation: yes.  Patient Denies accidental bowel leakage / fecal incontinence  Occurs: *** time(s) per {Time; day/week/month:13537}  Consistency with leakage: {stool consistency:24758} Bowel regimen: none Last colonoscopy: Date ***, Results *** [facility] Colonoscopy   This patient has no relevant Health Maintenance data.     Sexual Function Sexually active: no.  Sexual orientation: Bisexual Pain with sex: No  Pelvic Pain Denies pelvic pain   Past Medical History:  Past Medical History:  Diagnosis Date   Headache    Multiple sclerosis    Pseudotumor cerebri    Spina bifida [facility])      Past Surgical History:   Past Surgical History:  Procedure Laterality Date   BACK SURGERY  2017     Past OB/GYN History: OB  History  No obstetric history on file.    Vaginal deliveries: ***,  Forceps/ Vacuum deliveries: ***, Cesarean section: *** Menopausal: No, LMP Patient's last menstrual period was 07/19/2024 (exact date). Contraception: n/a. Last pap smear was ***2010.  Any history of abnormal pap smears: no. No results found for: DIAGPAP, HPVHIGH, ADEQPAP  Medications: Patient has a current medication list which  includes the following prescription(s): albuterol , bupropion , etodolac , fingolimod  hcl, gabapentin , methylphenidate , mirabegron  er, and zolpidem .   Allergies: Patient has no known allergies.   Social History: Social History[1]  Relationship status: single Patient lives with flatmates Ole & Corean Clarks.   Patient is employed as a Stage Manager. Regular exercise: No History of abuse: No  Family History:   Family History  Problem Relation Age of Onset   Hypertension Mother    Thyroid disease Mother    Hypertension Father    Thyroid disease Sister    Cancer Maternal Grandmother        brain   Heart disease Maternal Grandfather    Multiple sclerosis Neg Hx      Review of Systems: Review of Systems  Constitutional:  Positive for malaise/fatigue. Negative for fever and weight loss.       Weight gain  Respiratory:  Negative for cough, shortness of breath and wheezing.   Cardiovascular:  Positive for leg swelling. Negative for chest pain and palpitations.  Gastrointestinal:  Negative for abdominal pain and blood in stool.  Genitourinary:  Positive for frequency and urgency. Negative for dysuria and hematuria.       Abnormal cycles, leakage  Skin:  Negative for rash.  Neurological:  Positive for headaches. Negative for dizziness and weakness.  Endo/Heme/Allergies:  Bruises/bleeds easily.  Psychiatric/Behavioral:  Positive for depression. The patient is not nervous/anxious.      OBJECTIVE Physical Exam: There were no vitals filed for this visit.  Physical Exam Constitutional:      General: She is not in acute distress.    Appearance: Normal appearance.  Genitourinary:     Bladder and urethral meatus normal.     No lesions in the vagina.     Right Labia: No rash, tenderness, lesions, skin changes or Bartholin's cyst.    Left Labia: No tenderness, lesions, skin changes, Bartholin's cyst or rash.    No vaginal discharge, erythema, tenderness, bleeding,  ulceration or granulation tissue.      Right Adnexa: not tender, not full and no mass present.    Left Adnexa: not tender, not full and no mass present.    No cervical motion tenderness, discharge, friability, lesion, polyp or nabothian cyst.     Uterus is not enlarged, fixed, tender or irregular.     No uterine mass detected.    Urethral meatus caruncle not present.    No urethral prolapse, tenderness, mass, hypermobility or discharge present.     Bladder is not tender, urgency on palpation not present and masses not present.      Levator ani not tender, obturator internus not tender, no asymmetrical contractions present and no pelvic spasms present.    Anal wink present and BC reflex present. Cardiovascular:     Rate and Rhythm: Normal rate.  Pulmonary:     Effort: Pulmonary effort is normal. No respiratory distress.  Abdominal:     General: There is no distension.     Palpations: There is no mass.     Tenderness: There is no abdominal tenderness.     Hernia: No hernia is present.  Neurological:     Mental Status: She is alert.  Vitals reviewed. Exam conducted with a chaperone present.      POP-Q:   POP-Q                                               Aa                                               Ba                                                 C                                                Gh                                               Pb                                               tvl                                                Ap                                               Bp                                                 D      Rectal Exam:  Normal sphincter tone, {rectocele:24766} distal rectocele, enterocoele {DESC; PRESENT/NOT PRESENT:21021351}, no rectal masses, {sign of:24767} dyssynergia when asking the patient to bear down.  Post-Void Residual (PVR) by Bladder Scan: In order to evaluate bladder emptying, we discussed obtaining a  postvoid residual and patient agreed to this procedure.  Procedure: The ultrasound unit was placed on the patient's abdomen in the suprapubic region after the patient had voided.      Laboratory Results: Lab Results  Component Value Date   BILIRUBINUR SMALL (A) 05/18/2016   PROTEINUR NEGATIVE 05/18/2016   LEUKOCYTESUR SMALL (A) 05/18/2016    Lab Results  Component Value Date   CREATININE 0.85 12/21/2023   CREATININE 0.84 01/17/2023   CREATININE 0.73 06/28/2022    No results found for: HGBA1C  Lab Results  Component Value Date   HGB 13.8 07/19/2024     ASSESSMENT AND PLAN Ms. Schlemmer is a 49 y.o. with:  1. Neurogenic bladder     Neurogenic bladder     Lianne ONEIDA Gillis, MD        [1]  Social History Tobacco Use   Smoking status: Never   Smokeless tobacco: Never  Vaping Use   Vaping status: Never Used  Substance Use Topics   Alcohol use: No    Alcohol/week: 0.0 standard drinks of alcohol   Drug use: No   "

## 2024-07-26 ENCOUNTER — Encounter: Payer: Self-pay | Admitting: Obstetrics

## 2024-07-26 ENCOUNTER — Ambulatory Visit: Admitting: Obstetrics

## 2024-07-26 ENCOUNTER — Ambulatory Visit: Payer: Self-pay | Admitting: Obstetrics

## 2024-07-26 ENCOUNTER — Other Ambulatory Visit (HOSPITAL_COMMUNITY)
Admission: RE | Admit: 2024-07-26 | Discharge: 2024-07-26 | Disposition: A | Source: Ambulatory Visit | Attending: Obstetrics | Admitting: Obstetrics

## 2024-07-26 VITALS — BP 155/117 | HR 102 | Ht 62.0 in | Wt 211.0 lb

## 2024-07-26 DIAGNOSIS — N952 Postmenopausal atrophic vaginitis: Secondary | ICD-10-CM | POA: Insufficient documentation

## 2024-07-26 DIAGNOSIS — M6289 Other specified disorders of muscle: Secondary | ICD-10-CM | POA: Insufficient documentation

## 2024-07-26 DIAGNOSIS — R8290 Unspecified abnormal findings in urine: Secondary | ICD-10-CM

## 2024-07-26 DIAGNOSIS — R351 Nocturia: Secondary | ICD-10-CM | POA: Insufficient documentation

## 2024-07-26 DIAGNOSIS — N319 Neuromuscular dysfunction of bladder, unspecified: Secondary | ICD-10-CM

## 2024-07-26 DIAGNOSIS — Z124 Encounter for screening for malignant neoplasm of cervix: Secondary | ICD-10-CM | POA: Insufficient documentation

## 2024-07-26 LAB — POCT URINALYSIS DIP (CLINITEK)
Glucose, UA: NEGATIVE mg/dL
Ketones, POC UA: NEGATIVE mg/dL
Nitrite, UA: POSITIVE — AB
POC PROTEIN,UA: 100 — AB
Spec Grav, UA: >=1.03 — AB
Urobilinogen, UA: 0.2 U/dL
pH, UA: 6

## 2024-07-26 LAB — URINALYSIS, COMPLETE (UACMP) WITH MICROSCOPIC
Bilirubin Urine: NEGATIVE
Glucose, UA: NEGATIVE mg/dL
Hgb urine dipstick: NEGATIVE
Ketones, ur: NEGATIVE mg/dL
Nitrite: POSITIVE — AB
Protein, ur: 100 mg/dL — AB
Specific Gravity, Urine: 1.02 (ref 1.005–1.030)
WBC, UA: >50 WBC/hpf (ref 0–5)
pH: 6 (ref 5.0–8.0)

## 2024-07-26 MED ORDER — ESTRADIOL 0.01 % VA CREA: TOPICAL_CREAM | VAGINAL | 3 refills | Status: AC

## 2024-07-26 NOTE — Assessment & Plan Note (Signed)
-   For symptomatic vaginal atrophy options include lubrication with a water-based lubricant, personal hygiene measures and barrier protection against wetness, and estrogen replacement in the form of vaginal cream, vaginal tablets, or a time-released vaginal ring.   - Rx to start low dose vaginal estrogen

## 2024-07-26 NOTE — Assessment & Plan Note (Signed)
-   avoid fluid intake 3 hours before bedtime - elevated feet during the day or use compression socks to reduce lower extremity swelling - encouraged trial of CIC 1x/night to assess urinary symptoms - trial of mirabegron  and oxybutynin  at bedtime

## 2024-07-26 NOTE — Assessment & Plan Note (Addendum)
-   POCT catheterized UA + leuk/heme, pending UA microscopy and urine culture. PVR 40mL - using Mirabegron  50mg  and oxybutynin  - MS, spina bifida, lumbar synovial cyst and back pain, history of kidney stones - MRI lumbar spine 2024 L4-L5 resulting in severe left neural foraminal stenosis, Grade 1 anterolisthesis with advanced facet arthropathy at L5-S1 and a probable medially projecting synovial cyst with progression since 2019 - We discussed the symptoms of overactive bladder (OAB), which include urinary urgency, urinary frequency, nocturia, with or without urge incontinence.  While we do not know the exact etiology of OAB, several treatment options exist. We discussed management including behavioral therapy (decreasing bladder irritants, urge suppression strategies, timed voids, bladder retraining), physical therapy, medication; for refractory cases posterior tibial nerve stimulation, sacral neuromodulation, and intravesical botulinum toxin injection.  For anticholinergic medications, we discussed the potential side effects of anticholinergics including dry eyes, dry mouth, constipation, cognitive impairment and urinary retention. For Beta-3 agonist medication, we discussed the potential side effect of elevated blood pressure which is more likely to occur in individuals with uncontrolled hypertension. - UDS 03/23/16 with DO, no SUI. MCC 337mL. High detrusor pressure Pdet at Qmax 45 with low flow Qmax 47mL/sec. PVR , repeat void with repeat PVR . Concerned for DESD and outlet obstruction.  - encouraged to follow-up with urology and proceed with vUDS to rule out reflux, DESD, and abnormal compliance - underwent CIC teaching today without difficulty - For refractory OAB we reviewed the procedure for intravesical Botox injection with cystoscopy in the office and reviewed the risks, benefits and alternatives of treatment including but not limited to infection, need for self-catheterization and need for  repeat therapy.   - We discussed that there is a 5-15% chance of needing to catheterize with Botox and that this usually resolves in a few months; however can persist for longer periods of time.   - Typically Botox injections would need to be repeated every 3-12 months since this is not a permanent therapy.

## 2024-07-26 NOTE — Assessment & Plan Note (Addendum)
-   high tone pelvic floor, valsalva during attempt to perform Kegel exercises - left sided pelvic floor myofascial pain on exam in the setting of MS, spina bifida, lumbar synovial cyst and back pain - MRI lumbar spine 2024 L4-L5 resulting in severe left neural foraminal stenosis, Grade 1 anterolisthesis with advanced facet arthropathy at L5-S1 and a probable medially projecting synovial cyst with progression since 2019 - The origin of pelvic floor muscle spasm can be multifactorial, including primary, reactive to a different pain source, trauma, or even part of a centralized pain syndrome.Treatment options include pelvic floor physical therapy, local (vaginal) or oral  muscle relaxants, pelvic muscle trigger point injections or centrally acting pain medications.   - referral to pelvic floor PT

## 2024-07-26 NOTE — Patient Instructions (Addendum)
 Continue mirabegron  50mg  and oxybutynin  at bedtime.   For night time frequency: - avoid fluid intake 3 hours before bedtime - elevated your feet during the day or use compression socks to reduce lower extremity swelling  Please follow-up with Dr. MacDiarmid for video urodynamic testing.   We discussed the symptoms of overactive bladder (OAB), which include urinary urgency, urinary frequency, night-time urination, with or without urge incontinence.  We discussed management including behavioral therapy (decreasing bladder irritants by following a bladder diet, urge suppression strategies, timed voids, bladder retraining), physical therapy, medication; and for refractory cases posterior tibial nerve stimulation, sacral neuromodulation, and intravesical botulinum toxin injection.   Start clean intermittent catheterization if you experience sensation of incomplete emptying.   For vaginal atrophy (thinning of the vaginal tissue that can cause dryness and burning) and UTI prevention we discussed estrogen replacement in the form of vaginal cream.   Start vaginal estrogen therapy nightly for two weeks then 2 times weekly at night. This can be placed with your finger or an applicator inside the vagina and around the urethra.  Please let us  know if the prescription is too expensive and we can look for alternative options.   Is vaginal estrogen therapy safe for me? Vaginal estrogen preparations act on the vaginal skin, and only a very tiny amount is absorbed into the bloodstream (0.01%).  They work in a similar way to hand or face cream.  There is minimal absorption and they are therefore perfectly safe. If you have had breast cancer and have persistent troublesome symptoms which aren't settling with vaginal moisturisers and lubricants, local estrogen treatment may be a possibility, but consultation with your oncologist should take place first.   The origin of pelvic floor muscle spasm can be multifactorial,  including primary, reactive to a different pain source, trauma, or even part of a centralized pain syndrome.Treatment options include pelvic floor physical therapy, local (vaginal) or oral  muscle relaxants, pelvic muscle trigger point injections or centrally acting pain medications.     Please call [phone] to schedule the earliest appointment for pelvic floor PT.

## 2024-07-26 NOTE — Assessment & Plan Note (Signed)
-   denies cervical cancer screening since 2010, denies history of abnormal pap smears - referral sent to GYN to establish care

## 2024-07-26 NOTE — Assessment & Plan Note (Addendum)
-   catheterized UA + leuk/heme, pending UA microscopy and urine culture - For management of microscopic hematuria, we discussed the importance of work-up including assessing the upper and lower GU tract with CT urogram and cystoscopy.  - Cr 0.85 in 12/21/23  - Rx to start low dose vaginal estrogen

## 2024-07-27 LAB — URINE CULTURE: Culture: 50000 — AB

## 2024-09-20 ENCOUNTER — Ambulatory Visit: Admitting: Obstetrics

## 2025-03-07 ENCOUNTER — Ambulatory Visit: Admitting: Neurology
